# Patient Record
Sex: Female | Born: 1985 | Hispanic: Yes | Marital: Married | Smoking: Never smoker
Health system: Southern US, Community
[De-identification: ages and names within clinical notes are randomized; demographics above are authoritative.]

## PROBLEM LIST (undated history)

## (undated) ENCOUNTER — Inpatient Hospital Stay (HOSPITAL_COMMUNITY): Payer: Self-pay

## (undated) DIAGNOSIS — E119 Type 2 diabetes mellitus without complications: Secondary | ICD-10-CM

## (undated) DIAGNOSIS — K8020 Calculus of gallbladder without cholecystitis without obstruction: Secondary | ICD-10-CM

## (undated) DIAGNOSIS — O24419 Gestational diabetes mellitus in pregnancy, unspecified control: Secondary | ICD-10-CM

## (undated) HISTORY — DX: Calculus of gallbladder without cholecystitis without obstruction: K80.20

## (undated) HISTORY — PX: NO PAST SURGERIES: SHX2092

---

## 2013-09-20 LAB — PREGNANCY, URINE

## 2013-09-22 ENCOUNTER — Inpatient Hospital Stay (HOSPITAL_COMMUNITY)
Admission: AD | Admit: 2013-09-22 | Discharge: 2013-09-22 | Disposition: A | Discharge status: Home / Self Care | Payer: Self-pay | Source: Ambulatory Visit | Attending: Family Medicine | Admitting: Family Medicine

## 2013-09-22 ENCOUNTER — Inpatient Hospital Stay (HOSPITAL_COMMUNITY): Payer: Self-pay

## 2013-09-22 ENCOUNTER — Encounter (HOSPITAL_COMMUNITY): Payer: Self-pay | Admitting: *Deleted

## 2013-09-22 DIAGNOSIS — O2390 Unspecified genitourinary tract infection in pregnancy, unspecified trimester: Secondary | ICD-10-CM | POA: Insufficient documentation

## 2013-09-22 DIAGNOSIS — N898 Other specified noninflammatory disorders of vagina: Secondary | ICD-10-CM | POA: Insufficient documentation

## 2013-09-22 DIAGNOSIS — R011 Cardiac murmur, unspecified: Secondary | ICD-10-CM | POA: Insufficient documentation

## 2013-09-22 DIAGNOSIS — O26899 Other specified pregnancy related conditions, unspecified trimester: Secondary | ICD-10-CM

## 2013-09-22 DIAGNOSIS — N390 Urinary tract infection, site not specified: Secondary | ICD-10-CM | POA: Insufficient documentation

## 2013-09-22 DIAGNOSIS — R109 Unspecified abdominal pain: Secondary | ICD-10-CM | POA: Insufficient documentation

## 2013-09-22 DIAGNOSIS — O2340 Unspecified infection of urinary tract in pregnancy, unspecified trimester: Secondary | ICD-10-CM

## 2013-09-22 LAB — CBC WITH DIFFERENTIAL/PLATELET
Basophils Absolute: 0 10*3/uL (ref 0.0–0.1)
Basophils Relative: 0 % (ref 0–1)
Eosinophils Absolute: 0.3 10*3/uL (ref 0.0–0.7)
Eosinophils Relative: 3 % (ref 0–5)
HCT: 36.4 % (ref 36.0–46.0)
Hemoglobin: 13.1 g/dL (ref 12.0–15.0)
Lymphocytes Relative: 30 % (ref 12–46)
Lymphs Abs: 2.7 10*3/uL (ref 0.7–4.0)
MCH: 33.3 pg (ref 26.0–34.0)
MCHC: 36 g/dL (ref 30.0–36.0)
MCV: 92.6 fL (ref 78.0–100.0)
Monocytes Absolute: 0.6 10*3/uL (ref 0.1–1.0)
Monocytes Relative: 7 % (ref 3–12)
Neutro Abs: 5.4 10*3/uL (ref 1.7–7.7)
Neutrophils Relative %: 60 % (ref 43–77)
Platelets: 191 10*3/uL (ref 150–400)
RBC: 3.93 MIL/uL (ref 3.87–5.11)
RDW: 12.4 % (ref 11.5–15.5)
WBC: 9.1 10*3/uL (ref 4.0–10.5)

## 2013-09-22 LAB — URINALYSIS, ROUTINE W REFLEX MICROSCOPIC
Bilirubin Urine: NEGATIVE
Glucose, UA: NEGATIVE mg/dL
Ketones, ur: 40 mg/dL — AB
Nitrite: NEGATIVE
Protein, ur: NEGATIVE mg/dL
Specific Gravity, Urine: >1.03 — ABNORMAL HIGH (ref 1.005–1.030)
Urobilinogen, UA: 0.2 mg/dL (ref 0.0–1.0)
pH: 6 (ref 5.0–8.0)

## 2013-09-22 LAB — URINE MICROSCOPIC-ADD ON

## 2013-09-22 LAB — WET PREP, GENITAL
Trich, Wet Prep: NONE SEEN
Yeast Wet Prep HPF POC: NONE SEEN

## 2013-09-22 LAB — HCG, QUANTITATIVE, PREGNANCY: hCG, Beta Chain, Quant, S: 15467 m[IU]/mL — ABNORMAL HIGH (ref <5)

## 2013-09-22 LAB — POCT PREGNANCY, URINE: Preg Test, Ur: POSITIVE — AB

## 2013-09-22 MED ORDER — CEPHALEXIN 500 MG PO CAPS: 500.0000 mg | ORAL_CAPSULE | Freq: Three times a day (TID) | ORAL | Status: DC

## 2013-09-22 NOTE — MAU Provider Note (Signed)
CSN: [id number]     Arrival date & time 09/22/13  1652 History   None    Chief Complaint  Patient presents with  . Abdominal Cramping     (Consider location/radiation/quality/duration/timing/severity/associated sxs/prior Treatment) Patient is a 28 y.o. female presenting with cramps. The history is provided by the patient.  Abdominal Cramping The primary symptoms of the illness include abdominal pain, fatigue and vaginal discharge. The primary symptoms of the illness do not include fever, shortness of breath, nausea, vomiting, diarrhea or dysuria. The current episode started more than 2 days ago. The onset of the illness was gradual. The problem has been gradually worsening.  The vaginal discharge was first noticed today. Vaginal discharge is a new problem. The patient believes that the vaginal discharge is unchanged since it began. The amount of discharge is scant. The color of the discharge is brown. The vaginal discharge smells foul. The vaginal discharge is not associated with itching, burning, dysuria or dyspareunia.   The patient states that she believes she is currently pregnant. The patient has not had a change in bowel habit. Additional symptoms associated with the illness include heartburn, frequency and back pain. Symptoms associated with the illness do not include chills, constipation, urgency or hematuria.   Zenaida Haggar is a 28 y.o. G1P0, LMP 08/10/2013 and was normal. She complains of abdominal pain x 2 days that she describes as sharp, cramping that comes and goes. The vaginal discharge is dark color with odor. She has taken no medication for pain. Nothing makes the pain or bleeding better or worse.  Past Medical History  Diagnosis Date  . Heart murmur    Past Surgical History  Procedure Laterality Date  . No past surgeries     No family history on file. History  Substance Use Topics  . Smoking status: Never Smoker   . Smokeless tobacco: Not on file  . Alcohol Use: No    OB History   Grav Para Term Preterm Abortions TAB SAB Ect Mult Living   1              Review of Systems  Constitutional: Positive for fatigue. Negative for fever and chills.  HENT: Negative.   Eyes: Negative for pain and visual disturbance.  Respiratory: Negative for cough and shortness of breath.   Cardiovascular: Negative for chest pain.  Gastrointestinal: Positive for heartburn and abdominal pain. Negative for nausea, vomiting, diarrhea and constipation.  Genitourinary: Positive for frequency and vaginal discharge. Negative for dysuria, urgency, hematuria and dyspareunia.  Musculoskeletal: Positive for back pain.  Skin: Negative for itching and rash.  Neurological: Negative for dizziness, light-headedness and headaches.  Psychiatric/Behavioral: Negative for confusion. The patient is not nervous/anxious.       Allergies  Review of patient's allergies indicates no known allergies.  Home Medications  No current outpatient prescriptions on file. BP 106/55  Pulse 77  Temp(Src) 98.8 F (37.1 C) (Oral)  Resp 20  Ht 5' 1.5" (1.562 m)  Wt 231 lb (104.781 kg)  BMI 42.95 kg/m2  LMP 08/10/2013 Physical Exam  Nursing note and vitals reviewed. Constitutional: She is oriented to person, place, and time. She appears well-developed and well-nourished. No distress.  HENT:  Head: Normocephalic.  Eyes: Conjunctivae and EOM are normal.  Neck: Neck supple.  Cardiovascular: Normal rate.   Pulmonary/Chest: Effort normal.  Abdominal: Soft. There is no tenderness.  Genitourinary:  External genitalia without lesions. Small amount of dark blood vaginal vault. Cervix closed, long, no CMT, no adnexal  tenderness. Uterus slightly enlarged.   Musculoskeletal: Normal range of motion.  Neurological: She is alert and oriented to person, place, and time. No cranial nerve deficit.  Skin: Skin is warm and dry.  Psychiatric: She has a normal mood and affect. Her behavior is normal.   Results for  orders placed during the hospital encounter of 09/22/13 (from the past 24 hour(s))  URINALYSIS, ROUTINE W REFLEX MICROSCOPIC     Status: Abnormal   Collection Time    09/22/13  5:10 PM      Result Value Ref Range   Color, Urine YELLOW  YELLOW   APPearance CLEAR  CLEAR   Specific Gravity, Urine >1.030 (*) 1.005 - 1.030   pH 6.0  5.0 - 8.0   Glucose, UA NEGATIVE  NEGATIVE mg/dL   Hgb urine dipstick LARGE (*) NEGATIVE   Bilirubin Urine NEGATIVE  NEGATIVE   Ketones, ur 40 (*) NEGATIVE mg/dL   Protein, ur NEGATIVE  NEGATIVE mg/dL   Urobilinogen, UA 0.2  0.0 - 1.0 mg/dL   Nitrite NEGATIVE  NEGATIVE   Leukocytes, UA TRACE (*) NEGATIVE  URINE MICROSCOPIC-ADD ON     Status: Abnormal   Collection Time    09/22/13  5:10 PM      Result Value Ref Range   Squamous Epithelial / LPF FEW (*) RARE   WBC, UA 21-50  <3 WBC/hpf   RBC / HPF 7-10  <3 RBC/hpf   Bacteria, UA MANY (*) RARE   Urine-Other MUCOUS PRESENT    POCT PREGNANCY, URINE     Status: Abnormal   Collection Time    09/22/13  5:49 PM      Result Value Ref Range   Preg Test, Ur POSITIVE (*) NEGATIVE  CBC WITH DIFFERENTIAL     Status: None   Collection Time    09/22/13  5:50 PM      Result Value Ref Range   WBC 9.1  4.0 - 10.5 K/uL   RBC 3.93  3.87 - 5.11 MIL/uL   Hemoglobin 13.1  12.0 - 15.0 g/dL   HCT 16.1  09.6 - 04.5 %   MCV 92.6  78.0 - 100.0 fL   MCH 33.3  26.0 - 34.0 pg   MCHC 36.0  30.0 - 36.0 g/dL   RDW 40.9  81.1 - 91.4 %   Platelets 191  150 - 400 K/uL   Neutrophils Relative % 60  43 - 77 %   Neutro Abs 5.4  1.7 - 7.7 K/uL   Lymphocytes Relative 30  12 - 46 %   Lymphs Abs 2.7  0.7 - 4.0 K/uL   Monocytes Relative 7  3 - 12 %   Monocytes Absolute 0.6  0.1 - 1.0 K/uL   Eosinophils Relative 3  0 - 5 %   Eosinophils Absolute 0.3  0.0 - 0.7 K/uL   Basophils Relative 0  0 - 1 %   Basophils Absolute 0.0  0.0 - 0.1 K/uL  HCG, QUANTITATIVE, PREGNANCY     Status: Abnormal   Collection Time    09/22/13  5:50 PM       Result Value Ref Range   hCG, Beta Chain, Quant, S 15467 (*) <5 mIU/mL  WET PREP, GENITAL     Status: Abnormal   Collection Time    09/22/13  7:15 PM      Result Value Ref Range   Yeast Wet Prep HPF POC NONE SEEN  NONE SEEN   Trich, Wet Prep NONE  SEEN  NONE SEEN   Clue Cells Wet Prep HPF POC FEW (*) NONE SEEN   WBC, Wet Prep HPF POC MODERATE (*) NONE SEEN    [place] Ob Comp Less 14 Wks  09/22/2013   CLINICAL DATA:  Pelvic pain.  EXAM: OBSTETRIC <14 WK US AND TRANSVAGINAL OB US  TECHNIQUE: Both transabdominal and transvaginal ultrasound examinations were performed for complete evaluation of the gestation as well as the maternal uterus, adnexal regions, and pelvic cul-de-sac. Transvaginal technique was performed to assess early pregnancy.  COMPARISON:  None.  FINDINGS: Intrauterine gestational sac: Visualized/normal in shape.  Yolk sac:  Present  Embryo:  Present  Cardiac Activity: Present  Heart Rate:  92 bpm  CRL:   2.7  mm   5 w 6 d                  US EDC:  Maternal uterus/adnexae:  No subchorionic hemorrhage.  Normal right ovary.  Normal left ovary.  No free pelvic fluid.  IMPRESSION: Single living intrauterine embryo estimated at 5 weeks and 6 days gestation.  No subchorionic hemorrhage.  Normal ovaries.   Electronically Signed   By: Loralie ChampagneMark  Gallerani M.D.   On: 09/22/2013 18:37   [place] Ob Transvaginal  09/22/2013   CLINICAL DATA:  Pelvic pain.  EXAM: OBSTETRIC <14 WK US AND TRANSVAGINAL OB US  TECHNIQUE: Both transabdominal and transvaginal ultrasound examinations were performed for complete evaluation of the gestation as well as the maternal uterus, adnexal regions, and pelvic cul-de-sac. Transvaginal technique was performed to assess early pregnancy.  COMPARISON:  None.  FINDINGS: Intrauterine gestational sac: Visualized/normal in shape.  Yolk sac:  Present  Embryo:  Present  Cardiac Activity: Present  Heart Rate:  92 bpm  CRL:   2.7  mm   5 w 6 d                  US EDC:  Maternal uterus/adnexae:  No  subchorionic hemorrhage.  Normal right ovary.  Normal left ovary.  No free pelvic fluid.  IMPRESSION: Single living intrauterine embryo estimated at 5 weeks and 6 days gestation.  No subchorionic hemorrhage.  Normal ovaries.   Electronically Signed   By: Loralie ChampagneMark  Gallerani M.D.   On: 09/22/2013 18:37    ED Course  Procedures   MDM  28 y.o. female with abdominal pain and vaginal discharge in early pregnancy. Will treat for UTI and she will start prenatal care. I have reviewed this patient's vital signs, nurses notes, appropriate labs and imaging.  I have discussed findings with the patient and plan of care. She voices understanding.    Medication List    TAKE these medications       cephALEXin 500 MG capsule  Commonly known as:  KEFLEX  Take 1 capsule (500 mg total) by mouth 3 (three) times daily.      ASK your doctor about these medications       prenatal multivitamin Tabs tablet  Take 1 tablet by mouth daily at 12 noon.

## 2013-09-22 NOTE — MAU Note (Signed)
Has been having cramping.  Found out preg 04/05.  Had a really strong cramp at work today and had noticed a reddish d/c today.

## 2013-09-23 LAB — GC/CHLAMYDIA PROBE AMP
CT Probe RNA: NEGATIVE
GC Probe RNA: NEGATIVE

## 2013-09-23 LAB — CULTURE, OB URINE: Colony Count: >=100000

## 2013-09-26 NOTE — MAU Provider Note (Signed)
Attestation of Attending Supervision of Advanced Practitioner (PA/CNM/NP): Evaluation and management procedures were performed by the Advanced Practitioner under my supervision and collaboration.  I have reviewed the Advanced Practitioner's note and chart, and I agree with the management and plan.  Reva Boresanya S Thomasene Dubow, MD [facility] Attending 09/26/2013 8:08 AM

## 2013-11-02 ENCOUNTER — Encounter: Payer: Self-pay | Admitting: Advanced Practice Midwife

## 2013-11-10 ENCOUNTER — Encounter: Payer: Self-pay | Admitting: *Deleted

## 2013-11-11 ENCOUNTER — Encounter: Payer: Self-pay | Admitting: General Practice

## 2013-12-12 ENCOUNTER — Encounter: Payer: Self-pay | Admitting: General Practice

## 2013-12-26 ENCOUNTER — Encounter: Payer: Self-pay | Attending: Family Medicine | Admitting: *Deleted

## 2013-12-26 ENCOUNTER — Ambulatory Visit (INDEPENDENT_AMBULATORY_CARE_PROVIDER_SITE_OTHER): Payer: Self-pay | Admitting: Family Medicine

## 2013-12-26 ENCOUNTER — Encounter: Payer: Self-pay | Admitting: Family Medicine

## 2013-12-26 VITALS — BP 97/62 | HR 74 | Wt 220.1 lb

## 2013-12-26 DIAGNOSIS — E119 Type 2 diabetes mellitus without complications: Secondary | ICD-10-CM

## 2013-12-26 DIAGNOSIS — Z713 Dietary counseling and surveillance: Secondary | ICD-10-CM | POA: Insufficient documentation

## 2013-12-26 DIAGNOSIS — O24919 Unspecified diabetes mellitus in pregnancy, unspecified trimester: Secondary | ICD-10-CM

## 2013-12-26 DIAGNOSIS — O24912 Unspecified diabetes mellitus in pregnancy, second trimester: Secondary | ICD-10-CM | POA: Insufficient documentation

## 2013-12-26 DIAGNOSIS — Z3400 Encounter for supervision of normal first pregnancy, unspecified trimester: Secondary | ICD-10-CM

## 2013-12-26 LAB — POCT URINALYSIS DIP (DEVICE)
Bilirubin Urine: NEGATIVE
Glucose, UA: NEGATIVE mg/dL
Hgb urine dipstick: NEGATIVE
Ketones, ur: NEGATIVE mg/dL
Nitrite: NEGATIVE
Protein, ur: NEGATIVE mg/dL
Specific Gravity, Urine: 1.025 (ref 1.005–1.030)
Urobilinogen, UA: 0.2 mg/dL (ref 0.0–1.0)
pH: 6 (ref 5.0–8.0)

## 2013-12-26 MED ORDER — ASPIRIN EC 81 MG PO TBEC: 81.0000 mg | DELAYED_RELEASE_TABLET | Freq: Every day | ORAL | Status: DC

## 2013-12-26 NOTE — Progress Notes (Signed)
Pt seen 12/26/13  for GDM diet education.  Obese woman pregravid with 22.9 pound loss at 5742w5d.  Attributes wt loss to healthier eating, only wanted to eat fruits and vegetables during early pregnancy.  Pt reports eating 5 x/day.  NKFA.  No N/V.  She is taking PNV.  No meds.  Discussed wt gain goal of 11-20# and incorporating physical activity into daily routine.  Pt given written and verbal GDM education.  She agrees to follow GDM diet including 3 meals + 3 snacks/day and proper carbohydrate/protein combination.  Follow-up in 4-6 wks.    Melanee LeftErin Cashwell, MPH RD LDN

## 2013-12-26 NOTE — Progress Notes (Signed)
  Patient was seen on 12/26/13 for Gestational Diabetes self-management . The following learning objectives were met by the patient :   States the definition of Gestational Diabetes  States when to check blood glucose levels  Demonstrates proper blood glucose monitoring techniques  States the effect of stress and exercise on blood glucose levels  States the importance of limiting caffeine and abstaining from alcohol and smoking  Plan:  Consider  increasing your activity level by walking daily as tolerated Begin checking BG before breakfast and 2 hours after first bit of breakfast, lunch and dinner after  as directed by MD  Take medication  as directed by MD  Blood glucose monitor given: [employer] Lot # [id number] Exp: 10/28/15  Patient instructed to monitor glucose levels: FBS: 60 - <90    2 hour: <120  Patient received the following handouts:  Nutrition Diabetes and Pregnancy  Carbohydrate Counting List  Meal Planning worksheet  Patient will be seen for follow-up as needed.

## 2013-12-26 NOTE — Progress Notes (Signed)
Patient states that [facility] She has had regular prenatal care. They have done her labs and pap smear. She reports that her sugar test was high. But doesn't know the value. Has not had anatomy scan yet. [facility]eds [facility].

## 2013-12-26 NOTE — Patient Instructions (Signed)
Second Trimester of Pregnancy The second trimester is from week 13 through week 28, months 4 through 6. The second trimester is often a time when you feel your best. Your body has also adjusted to being pregnant, and you begin to feel better physically. Usually, morning sickness has lessened or quit completely, you may have more energy, and you may have an increase in appetite. The second trimester is also a time when the fetus is growing rapidly. At the end of the sixth month, the fetus is about 9 inches long and weighs about 1 pounds. You will likely begin to feel the baby move (quickening) between 18 and 20 weeks of the pregnancy. BODY CHANGES Your body goes through many changes during pregnancy. The changes vary from woman to woman.   Your weight will continue to increase. You will notice your lower abdomen bulging out.  You may begin to get stretch marks on your hips, abdomen, and breasts.  You may develop headaches that can be relieved by medicines approved by your health care provider.  You may urinate more often because the fetus is pressing on your bladder.  You may develop or continue to have heartburn as a result of your pregnancy.  You may develop constipation because certain hormones are causing the muscles that push waste through your intestines to slow down.  You may develop hemorrhoids or swollen, bulging veins (varicose veins).  You may have back pain because of the weight gain and pregnancy hormones relaxing your joints between the bones in your pelvis and as a result of a shift in weight and the muscles that support your balance.  Your breasts will continue to grow and be tender.  Your gums may bleed and may be sensitive to brushing and flossing.  Dark spots or blotches (chloasma, mask of pregnancy) may develop on your face. This will likely fade after the baby is born.  A dark line from your belly button to the pubic area (linea nigra) may appear. This will likely fade  after the baby is born.  You may have changes in your hair. These can include thickening of your hair, rapid growth, and changes in texture. Some women also have hair loss during or after pregnancy, or hair that feels dry or thin. Your hair will most likely return to normal after your baby is born. WHAT TO EXPECT AT YOUR PRENATAL VISITS During a routine prenatal visit:  You will be weighed to make sure you and the fetus are growing normally.  Your blood pressure will be taken.  Your abdomen will be measured to track your baby's growth.  The fetal heartbeat will be listened to.  Any test results from the previous visit will be discussed. Your health care provider may ask you:  How you are feeling.  If you are feeling the baby move.  If you have had any abnormal symptoms, such as leaking fluid, bleeding, severe headaches, or abdominal cramping.  If you have any questions. Other tests that may be performed during your second trimester include:  Blood tests that check for:  Low iron levels (anemia).  Gestational diabetes (between 24 and 28 weeks).  Rh antibodies.  Urine tests to check for infections, diabetes, or protein in the urine.  An ultrasound to confirm the proper growth and development of the baby.  An amniocentesis to check for possible genetic problems.  Fetal screens for spina bifida and Down syndrome. HOME CARE INSTRUCTIONS   Avoid all smoking, herbs, alcohol, and unprescribed   drugs. These chemicals affect the formation and growth of the baby.  Follow your health care provider's instructions regarding medicine use. There are medicines that are either safe or unsafe to take during pregnancy.  Exercise only as directed by your health care provider. Experiencing uterine cramps is a good sign to stop exercising.  Continue to eat regular, healthy meals.  Wear a good support bra for breast tenderness.  Do not use hot tubs, steam rooms, or saunas.  Wear your  seat belt at all times when driving.  Avoid raw meat, uncooked cheese, cat litter boxes, and soil used by cats. These carry germs that can cause birth defects in the baby.  Take your prenatal vitamins.  Try taking a stool softener (if your health care provider approves) if you develop constipation. Eat more high-fiber foods, such as fresh vegetables or fruit and whole grains. Drink plenty of fluids to keep your urine clear or pale yellow.  Take warm sitz baths to soothe any pain or discomfort caused by hemorrhoids. Use hemorrhoid cream if your health care provider approves.  If you develop varicose veins, wear support hose. Elevate your feet for 15 minutes, 3-4 times a day. Limit salt in your diet.  Avoid heavy lifting, wear low heel shoes, and practice good posture.  Rest with your legs elevated if you have leg cramps or low back pain.  Visit your dentist if you have not gone yet during your pregnancy. Use a soft toothbrush to brush your teeth and be gentle when you floss.  A sexual relationship may be continued unless your health care provider directs you otherwise.  Continue to go to all your prenatal visits as directed by your health care provider. SEEK MEDICAL CARE IF:   You have dizziness.  You have mild pelvic cramps, pelvic pressure, or nagging pain in the abdominal area.  You have persistent nausea, vomiting, or diarrhea.  You have a bad smelling vaginal discharge.  You have pain with urination. SEEK IMMEDIATE MEDICAL CARE IF:   You have a fever.  You are leaking fluid from your vagina.  You have spotting or bleeding from your vagina.  You have severe abdominal cramping or pain.  You have rapid weight gain or loss.  You have shortness of breath with chest pain.  You notice sudden or extreme swelling of your face, hands, ankles, feet, or legs.  You have not felt your baby move in over an hour.  You have severe headaches that do not go away with  medicine.  You have vision changes. Document Released: 05/27/2001 Document Revised: 06/07/2013 Document Reviewed: 08/03/2012 Exit[employer] Patient Information 2015 [employer]. This information is not intended to replace advice given to you by your health care provider. Make sure you discuss any questions you have with your health care provider.  

## 2013-12-26 NOTE — Progress Notes (Signed)
  Subjective:    April [name] is being seen today for her first obstetrical visit.  [facility]  This is a planned pregnancy. States she was sent here for an elevated one hour glucola. She is at 1115w5d gestation. Her obstetrical history is significant for gestational DM. Relationship with FOB: spouse, living together. Patient does intend to breast feed. Pregnancy history fully reviewed.  Patient reports no complaints. Trying to watch diet. No fevers, chills, nausea, vomiting, diarrhea.    Review of Systems:   Review of Systems See above.  Objective:     BP 97/62  Pulse 74  Wt 220 lb 1.6 oz (99.837 kg)  LMP 08/10/2013 Physical Exam  Exam GENERAL: Well-developed, well-nourished female in no acute distress.  HEENT: Normocephalic, atraumatic. Sclerae anicteric.  NECK: Supple. Normal thyroid.  LUNGS: Clear to auscultation bilaterally.  HEART: Regular rate and rhythm. BABDOMEN: Soft, nontender, nondistended. gravid PELVIC: deferred  EXTREMITIES: No cyanosis, clubbing, or edema, 2+ distal pulses.    Assessment:    Pregnancy: G1P0 Patient Active Problem List   Diagnosis Date Noted  . Diabetes mellitus affecting pregnancy in second trimester, antepartum 12/26/2013       Plan:     Records requested.  Prenatal vitamins. Problem list reviewed and updated. Role of ultrasound in pregnancy discussed; fetal survey: ordered. Urine cx and UDS done today Diabetes education/ nutrition Start ASA  Follow up in 2 weeks. 50% of 30 min visit spent on counseling and coordination of care.   Latham Kinzler L 12/26/2013

## 2013-12-27 LAB — PRESCRIPTION MONITORING PROFILE (19 PANEL)
Amphetamine/Meth: NEGATIVE ng/mL
Barbiturate Screen, Urine: NEGATIVE ng/mL
Benzodiazepine Screen, Urine: NEGATIVE ng/mL
Buprenorphine, Urine: NEGATIVE ng/mL
Cannabinoid Scrn, Ur: NEGATIVE ng/mL
Carisoprodol, Urine: NEGATIVE ng/mL
Cocaine Metabolites: NEGATIVE ng/mL
Creatinine, Urine: 205.24 mg/dL (ref >20.0)
Fentanyl, Ur: NEGATIVE ng/mL
MDMA URINE: NEGATIVE ng/mL
Meperidine, Ur: NEGATIVE ng/mL
Methadone Screen, Urine: NEGATIVE ng/mL
Methaqualone: NEGATIVE ng/mL
Nitrites, Initial: NEGATIVE ug/mL
Opiate Screen, Urine: NEGATIVE ng/mL
Oxycodone Screen, Ur: NEGATIVE ng/mL
Phencyclidine, Ur: NEGATIVE ng/mL
Propoxyphene: NEGATIVE ng/mL
Tapentadol, urine: NEGATIVE ng/mL
Tramadol Scrn, Ur: NEGATIVE ng/mL
Zolpidem, Urine: NEGATIVE ng/mL
pH, Initial: 6.3 pH (ref 4.5–8.9)

## 2013-12-28 LAB — CULTURE, OB URINE: Colony Count: 4000

## 2013-12-29 ENCOUNTER — Ambulatory Visit (HOSPITAL_COMMUNITY)
Admission: RE | Admit: 2013-12-29 | Discharge: 2013-12-29 | Disposition: A | Discharge status: Home / Self Care | Payer: Self-pay | Source: Ambulatory Visit | Attending: Family Medicine | Admitting: Family Medicine

## 2013-12-29 ENCOUNTER — Other Ambulatory Visit: Payer: Self-pay | Admitting: Family Medicine

## 2013-12-29 DIAGNOSIS — O24912 Unspecified diabetes mellitus in pregnancy, second trimester: Secondary | ICD-10-CM

## 2013-12-29 DIAGNOSIS — O24919 Unspecified diabetes mellitus in pregnancy, unspecified trimester: Secondary | ICD-10-CM | POA: Insufficient documentation

## 2013-12-29 DIAGNOSIS — Z3689 Encounter for other specified antenatal screening: Secondary | ICD-10-CM | POA: Insufficient documentation

## 2013-12-29 DIAGNOSIS — E119 Type 2 diabetes mellitus without complications: Secondary | ICD-10-CM | POA: Insufficient documentation

## 2013-12-30 ENCOUNTER — Encounter: Payer: Self-pay | Admitting: Family Medicine

## 2014-01-09 ENCOUNTER — Encounter: Payer: Self-pay | Admitting: *Deleted

## 2014-01-09 ENCOUNTER — Ambulatory Visit (INDEPENDENT_AMBULATORY_CARE_PROVIDER_SITE_OTHER): Payer: Self-pay | Admitting: Family Medicine

## 2014-01-09 VITALS — BP 110/52 | HR 70 | Temp 98.2°F | Wt 221.1 lb

## 2014-01-09 DIAGNOSIS — E119 Type 2 diabetes mellitus without complications: Secondary | ICD-10-CM

## 2014-01-09 DIAGNOSIS — O24919 Unspecified diabetes mellitus in pregnancy, unspecified trimester: Secondary | ICD-10-CM

## 2014-01-09 DIAGNOSIS — O24912 Unspecified diabetes mellitus in pregnancy, second trimester: Secondary | ICD-10-CM

## 2014-01-09 DIAGNOSIS — O0990 Supervision of high risk pregnancy, unspecified, unspecified trimester: Secondary | ICD-10-CM | POA: Insufficient documentation

## 2014-01-09 LAB — HEMOGLOBIN A1C
Hgb A1c MFr Bld: 5.1 % (ref <5.7)
Mean Plasma Glucose: 100 mg/dL (ref <117)

## 2014-01-09 NOTE — Progress Notes (Signed)
Lab tech could not find a urine left by the patient/labs to be cancelled

## 2014-01-09 NOTE — Progress Notes (Signed)
ROI signed [facility]-- to be faxed by front office.   Optho appointment scheduled with [facility] for 01/12/14 at 0930. Fetal echo scheduled for 02/06/14 at 1015

## 2014-01-09 NOTE — Patient Instructions (Addendum)
Following an appropriate diet and keeping your blood sugar under control is the most important thing to do for your health and that of your unborn baby.  Please check your blood sugar 4 times daily.  Please keep accurate BS logs and bring them with you to every visit.  Please bring your meter also.  Goals for Blood sugar should be: 1. Fasting (first thing in the morning before eating) should be less than 90.   2.  2 hours after meals should be less than 120.  Please eat 3 meals and 3 snacks.  Include protein (meat, dairy-cheese, eggs, nuts) with all meals.  Be mindful that carbohydrates increase your blood sugar.  Not just sweet food (cookies, cake, donuts, fruit, juice, soda) but also bread, pasta, rice, and potatoes.  You have to limit how many carbs you are eating.  Adding exercise, as little as 30 minutes a day can decrease your blood sugar.  Gestational Diabetes Mellitus Gestational diabetes mellitus, often simply referred to as gestational diabetes, is a type of diabetes that some women develop during pregnancy. In gestational diabetes, the pancreas does not make enough insulin (a hormone), the cells are less responsive to the insulin that is made (insulin resistance), or both.Normally, insulin moves sugars from food into the tissue cells. The tissue cells use the sugars for energy. The lack of insulin or the lack of normal response to insulin causes excess sugars to build up in the blood instead of going into the tissue cells. As a result, high blood sugar (hyperglycemia) develops. The effect of high sugar (glucose) levels can cause many problems.  RISK FACTORS You have an increased chance of developing gestational diabetes if you have a family history of diabetes and also have one or more of the following risk factors:  A body mass index over 30 (obesity).  A previous pregnancy with gestational diabetes.  An older age at the time of pregnancy. If blood glucose levels are kept in  the normal range during pregnancy, women can have a healthy pregnancy. If your blood glucose levels are not well controlled, there may be risks to you, your unborn baby (fetus), your labor and delivery, or your newborn baby.  SYMPTOMS  If symptoms are experienced, they are much like symptoms you would normally expect during pregnancy. The symptoms of gestational diabetes include:   Increased thirst (polydipsia).  Increased urination (polyuria).  Increased urination during the night (nocturia).  Weight loss. This weight loss may be rapid.  Frequent, recurring infections.  Tiredness (fatigue).  Weakness.  Vision changes, such as blurred vision.  Fruity smell to your breath.  Abdominal pain. DIAGNOSIS Diabetes is diagnosed when blood glucose levels are increased. Your blood glucose level may be checked by one or more of the following blood tests:  A fasting blood glucose test. You will not be allowed to eat for at least 8 hours before a blood sample is taken.  A random blood glucose test. Your blood glucose is checked at any time of the day regardless of when you ate.  A hemoglobin A1c blood glucose test. A hemoglobin A1c test provides information about blood glucose control over the previous 3 months.  An oral glucose tolerance test (OGTT). Your blood glucose is measured after you have not eaten (fasted) for 1-3 hours and then after you drink a glucose-containing beverage. Since the hormones that cause insulin resistance are highest at about 24-28 weeks of a pregnancy, an OGTT is usually performed during that time. If   you have risk factors for gestational diabetes, your health care provider may test you for gestational diabetes earlier than 24 weeks of pregnancy. TREATMENT   You will need to take diabetes medicine or insulin daily to keep blood glucose levels in the desired range.  You will need to match insulin dosing with exercise and healthy food choices. The treatment goal is  to maintain the before-meal (preprandial), bedtime, and overnight blood glucose level at 60-99 mg/dL during pregnancy. The treatment goal is to further maintain peak after-meal blood sugar (postprandial glucose) level at 100-140 mg/dL. HOME CARE INSTRUCTIONS   Have your hemoglobin A1c level checked twice a year.  Perform daily blood glucose monitoring as directed by your health care provider. It is common to perform frequent blood glucose monitoring.  Monitor urine ketones when you are ill and as directed by your health care provider.  Take your diabetes medicine and insulin as directed by your health care provider to maintain your blood glucose level in the desired range.  Never run out of diabetes medicine or insulin. It is needed every day.  Adjust insulin based on your intake of carbohydrates. Carbohydrates can raise blood glucose levels but need to be included in your diet. Carbohydrates provide vitamins, minerals, and fiber which are an essential part of a healthy diet. Carbohydrates are found in fruits, vegetables, whole grains, dairy products, legumes, and foods containing added sugars.  Eat healthy foods. Alternate 3 meals with 3 snacks.  Maintain a healthy weight gain. The usual total expected weight gain varies according to your prepregnancy body mass index (BMI).  Carry a medical alert card or wear your medical alert jewelry.  Carry a 15-gram carbohydrate snack with you at all times to treat low blood glucose (hypoglycemia). Some examples of 15-gram carbohydrate snacks include:  Glucose tablets, 3 or 4.  Glucose gel, 15-gram tube.  Raisins, 2 tablespoons (24 g).  Jelly beans, 6.  Animal crackers, 8.  Fruit juice, regular soda, or low-fat milk, 4 ounces (120 mL).  Gummy treats, 9.  Recognize hypoglycemia. Hypoglycemia during pregnancy occurs with blood glucose levels of 60 mg/dL and below. The risk for hypoglycemia increases when fasting or skipping meals, during or  after intense exercise, and during sleep. Hypoglycemia symptoms can include:  Tremors or shakes.  Decreased ability to concentrate.  Sweating.  Increased heart rate.  Headache.  Dry mouth.  Hunger.  Irritability.  Anxiety.  Restless sleep.  Altered speech or coordination.  Confusion.  Treat hypoglycemia promptly. If you are alert and able to safely swallow, follow the 15:15 rule:  Take 15-20 grams of rapid-acting glucose or carbohydrate. Rapid-acting options include glucose gel, glucose tablets, or 4 ounces (120 mL) of fruit juice, regular soda, or low-fat milk.  Check your blood glucose level 15 minutes after taking the glucose.  Take 15-20 grams more of glucose if the repeat blood glucose level is still 70 mg/dL or below.  Eat a meal or snack within 1 hour once blood glucose levels return to normal.  Be alert to polyuria (excess urination) and polydipsia (excess thirst) which are early signs of hyperglycemia. An early awareness of hyperglycemia allows for prompt treatment. Treat hyperglycemia as directed by your health care provider.  Engage in at least 30 minutes of physical activity a day or as directed by your health care provider. Ten minutes of physical activity timed 30 minutes after each meal is encouraged to control postprandial blood glucose levels.  Adjust your insulin dosing and food intake as   needed if you start a new exercise or sport.  Follow your sick-day plan at any time you are unable to eat or drink as usual.  Avoid tobacco and alcohol use.  Keep all follow-up visits as directed by your health care provider.  Follow the advice of your health care provider regarding your prenatal and post-delivery (postpartum) appointments, meal planning, exercise, medicines, vitamins, blood tests, other medical tests, and physical activities.  Perform daily skin and foot care. Examine your skin and feet daily for cuts, bruises, redness, nail problems, bleeding,  blisters, or sores.  Brush your teeth and gums at least twice a day and floss at least once a day. Follow up with your dentist regularly.  Schedule an eye exam during the first trimester of your pregnancy or as directed by your health care provider.  Share your diabetes management plan with your workplace or school.  Stay up-to-date with immunizations.  Learn to manage stress.  Obtain ongoing diabetes education and support as needed.  Learn about and consider breastfeeding your baby.  You should have your blood sugar level checked 6-12 weeks after delivery. This is done with an oral glucose tolerance test (OGTT). SEEK MEDICAL CARE IF:   You are unable to eat food or drink fluids for more than 6 hours.  You have nausea and vomiting for more than 6 hours.  You have a blood glucose level of 200 mg/dL and you have ketones in your urine.  There is a change in mental status.  You develop vision problems.  You have a persistent headache.  You have upper abdominal pain or discomfort.  You develop an additional serious illness.  You have diarrhea for more than 6 hours.  You have been sick or have had a fever for a couple of days and are not getting better. SEEK IMMEDIATE MEDICAL CARE IF:   You have difficulty breathing.  You no longer feel the baby moving.  You are bleeding or have discharge from your vagina.  You start having premature contractions or labor. MAKE SURE YOU:  Understand these instructions.  Will watch your condition.  Will get help right away if you are not doing well or get worse. Document Released: 09/08/2000 Document Revised: 10/17/2013 Document Reviewed: 12/30/2011 [employer] Patient Information 2015 [employer]. This information is not intended to replace advice given to you by your health care provider. Make sure you discuss any questions you have with your health care provider.  Breastfeeding Deciding to breastfeed is one of the best choices  you can make for you and your baby. A change in hormones during pregnancy causes your breast tissue to grow and increases the number and size of your milk ducts. These hormones also allow proteins, sugars, and fats from your blood supply to make breast milk in your milk-producing glands. Hormones prevent breast milk from being released before your baby is born as well as prompt milk flow after birth. Once breastfeeding has begun, thoughts of your baby, as well as his or her sucking or crying, can stimulate the release of milk from your milk-producing glands.  BENEFITS OF BREASTFEEDING For Your Baby  Your first milk (colostrum) helps your baby's digestive system function better.   There are antibodies in your milk that help your baby fight off infections.   Your baby has a lower incidence of asthma, allergies, and sudden infant death syndrome.   The nutrients in breast milk are better for your baby than infant formulas and are designed uniquely for   your baby's needs.   Breast milk improves your baby's brain development.   Your baby is less likely to develop other conditions, such as childhood obesity, asthma, or type 2 diabetes mellitus.  For You   Breastfeeding helps to create a very special bond between you and your baby.   Breastfeeding is convenient. Breast milk is always available at the correct temperature and costs nothing.   Breastfeeding helps to burn calories and helps you lose the weight gained during pregnancy.   Breastfeeding makes your uterus contract to its prepregnancy size faster and slows bleeding (lochia) after you give birth.   Breastfeeding helps to lower your risk of developing type 2 diabetes mellitus, osteoporosis, and breast or ovarian cancer later in life. SIGNS THAT YOUR BABY IS HUNGRY Early Signs of Hunger  Increased alertness or activity.  Stretching.  Movement of the head from side to side.  Movement of the head and opening of the mouth when  the corner of the mouth or cheek is stroked (rooting).  Increased sucking sounds, smacking lips, cooing, sighing, or squeaking.  Hand-to-mouth movements.  Increased sucking of fingers or hands. Late Signs of Hunger  Fussing.  Intermittent crying. Extreme Signs of Hunger Signs of extreme hunger will require calming and consoling before your baby will be able to breastfeed successfully. Do not wait for the following signs of extreme hunger to occur before you initiate breastfeeding:   Restlessness.  A loud, strong cry.   Screaming. BREASTFEEDING BASICS Breastfeeding Initiation  Find a comfortable place to sit or lie down, with your neck and back well supported.  Place a pillow or rolled up blanket under your baby to bring him or her to the level of your breast (if you are seated). Nursing pillows are specially designed to help support your arms and your baby while you breastfeed.  Make sure that your baby's abdomen is facing your abdomen.   Gently massage your breast. With your fingertips, massage from your chest wall toward your nipple in a circular motion. This encourages milk flow. You may need to continue this action during the feeding if your milk flows slowly.  Support your breast with 4 fingers underneath and your thumb above your nipple. Make sure your fingers are well away from your nipple and your baby's mouth.   Stroke your baby's lips gently with your finger or nipple.   When your baby's mouth is open wide enough, quickly bring your baby to your breast, placing your entire nipple and as much of the colored area around your nipple (areola) as possible into your baby's mouth.   More areola should be visible above your baby's upper lip than below the lower lip.   Your baby's tongue should be between his or her lower gum and your breast.   Ensure that your baby's mouth is correctly positioned around your nipple (latched). Your baby's lips should create a seal on  your breast and be turned out (everted).  It is common for your baby to suck about 2-3 minutes in order to start the flow of breast milk. Latching Teaching your baby how to latch on to your breast properly is very important. An improper latch can cause nipple pain and decreased milk supply for you and poor weight gain in your baby. Also, if your baby is not latched onto your nipple properly, he or she may swallow some air during feeding. This can make your baby fussy. Burping your baby when you switch breasts during the feeding   can help to get rid of the air. However, teaching your baby to latch on properly is still the best way to prevent fussiness from swallowing air while breastfeeding. Signs that your baby has successfully latched on to your nipple:    Silent tugging or silent sucking, without causing you pain.   Swallowing heard between every 3-4 sucks.    Muscle movement above and in front of his or her ears while sucking.  Signs that your baby has not successfully latched on to nipple:   Sucking sounds or smacking sounds from your baby while breastfeeding.  Nipple pain. If you think your baby has not latched on correctly, slip your finger into the corner of your baby's mouth to break the suction and place it between your baby's gums. Attempt breastfeeding initiation again. Signs of Successful Breastfeeding Signs from your baby:   A gradual decrease in the number of sucks or complete cessation of sucking.   Falling asleep.   Relaxation of his or her body.   Retention of a small amount of milk in his or her mouth.   Letting go of your breast by himself or herself. Signs from you:  Breasts that have increased in firmness, weight, and size 1-3 hours after feeding.   Breasts that are softer immediately after breastfeeding.  Increased milk volume, as well as a change in milk consistency and color by the fifth day of breastfeeding.   Nipples that are not sore,  cracked, or bleeding. Signs That Your Baby is Getting Enough Milk  Wetting at least 3 diapers in a 24-hour period. The urine should be clear and pale yellow by age 5 days.  At least 3 stools in a 24-hour period by age 5 days. The stool should be soft and yellow.  At least 3 stools in a 24-hour period by age 7 days. The stool should be seedy and yellow.  No loss of weight greater than 10% of birth weight during the first 3 days of age.  Average weight gain of 4-7 ounces (113-198 g) per week after age 4 days.  Consistent daily weight gain by age 5 days, without weight loss after the age of 2 weeks. After a feeding, your baby may spit up a small amount. This is common. BREASTFEEDING FREQUENCY AND DURATION Frequent feeding will help you make more milk and can prevent sore nipples and breast engorgement. Breastfeed when you feel the need to reduce the fullness of your breasts or when your baby shows signs of hunger. This is called "breastfeeding on demand." Avoid introducing a pacifier to your baby while you are working to establish breastfeeding (the first 4-6 weeks after your baby is born). After this time you may choose to use a pacifier. Research has shown that pacifier use during the first year of a baby's life decreases the risk of sudden infant death syndrome (SIDS). Allow your baby to feed on each breast as long as he or she wants. Breastfeed until your baby is finished feeding. When your baby unlatches or falls asleep while feeding from the first breast, offer the second breast. Because newborns are often sleepy in the first few weeks of life, you may need to awaken your baby to get him or her to feed. Breastfeeding times will vary from baby to baby. However, the following rules can serve as a guide to help you ensure that your baby is properly fed:  Newborns (babies 4 weeks of age or younger) may breastfeed every 1-3 hours.    Newborns should not go longer than 3 hours during the day or 5  hours during the night without breastfeeding.  You should breastfeed your baby a minimum of 8 times in a 24-hour period until you begin to introduce solid foods to your baby at around 6 months of age. BREAST MILK PUMPING Pumping and storing breast milk allows you to ensure that your baby is exclusively fed your breast milk, even at times when you are unable to breastfeed. This is especially important if you are going back to work while you are still breastfeeding or when you are not able to be present during feedings. Your lactation consultant can give you guidelines on how long it is safe to store breast milk.  A breast pump is a machine that allows you to pump milk from your breast into a sterile bottle. The pumped breast milk can then be stored in a refrigerator or freezer. Some breast pumps are operated by hand, while others use electricity. Ask your lactation consultant which type will work best for you. Breast pumps can be purchased, but some hospitals and breastfeeding support groups lease breast pumps on a monthly basis. A lactation consultant can teach you how to hand express breast milk, if you prefer not to use a pump.  CARING FOR YOUR BREASTS WHILE YOU BREASTFEED Nipples can become dry, cracked, and sore while breastfeeding. The following recommendations can help keep your breasts moisturized and healthy:  Avoid using soap on your nipples.   Wear a supportive bra. Although not required, special nursing bras and tank tops are designed to allow access to your breasts for breastfeeding without taking off your entire bra or top. Avoid wearing underwire-style bras or extremely tight bras.  Air dry your nipples for 3-4minutes after each feeding.   Use only cotton bra pads to absorb leaked breast milk. Leaking of breast milk between feedings is normal.   Use lanolin on your nipples after breastfeeding. Lanolin helps to maintain your skin's normal moisture barrier. If you use pure lanolin,  you do not need to wash it off before feeding your baby again. Pure lanolin is not toxic to your baby. You may also hand express a few drops of breast milk and gently massage that milk into your nipples and allow the milk to air dry. In the first few weeks after giving birth, some women experience extremely full breasts (engorgement). Engorgement can make your breasts feel heavy, warm, and tender to the touch. Engorgement peaks within 3-5 days after you give birth. The following recommendations can help ease engorgement:  Completely empty your breasts while breastfeeding or pumping. You may want to start by applying warm, moist heat (in the shower or with warm water-soaked hand towels) just before feeding or pumping. This increases circulation and helps the milk flow. If your baby does not completely empty your breasts while breastfeeding, pump any extra milk after he or she is finished.  Wear a snug bra (nursing or regular) or tank top for 1-2 days to signal your body to slightly decrease milk production.  Apply ice packs to your breasts, unless this is too uncomfortable for you.  Make sure that your baby is latched on and positioned properly while breastfeeding. If engorgement persists after 48 hours of following these recommendations, contact your health care provider or a lactation consultant. OVERALL HEALTH CARE RECOMMENDATIONS WHILE BREASTFEEDING  Eat healthy foods. Alternate between meals and snacks, eating 3 of each per day. Because what you eat affects your breast   milk, some of the foods may make your baby more irritable than usual. Avoid eating these foods if you are sure that they are negatively affecting your baby.  Drink milk, fruit juice, and water to satisfy your thirst (about 10 glasses a day).   Rest often, relax, and continue to take your prenatal vitamins to prevent fatigue, stress, and anemia.  Continue breast self-awareness checks.  Avoid chewing and smoking  tobacco.  Avoid alcohol and drug use. Some medicines that may be harmful to your baby can pass through breast milk. It is important to ask your health care provider before taking any medicine, including all over-the-counter and prescription medicine as well as vitamin and herbal supplements. It is possible to become pregnant while breastfeeding. If birth control is desired, ask your health care provider about options that will be safe for your baby. SEEK MEDICAL CARE IF:   You feel like you want to stop breastfeeding or have become frustrated with breastfeeding.  You have painful breasts or nipples.  Your nipples are cracked or bleeding.  Your breasts are red, tender, or warm.  You have a swollen area on either breast.  You have a fever or chills.  You have nausea or vomiting.  You have drainage other than breast milk from your nipples.  Your breasts do not become full before feedings by the fifth day after you give birth.  You feel sad and depressed.  Your baby is too sleepy to eat well.  Your baby is having trouble sleeping.   Your baby is wetting less than 3 diapers in a 24-hour period.  Your baby has less than 3 stools in a 24-hour period.  Your baby's skin or the white part of his or her eyes becomes yellow.   Your baby is not gaining weight by 5 days of age. SEEK IMMEDIATE MEDICAL CARE IF:   Your baby is overly tired (lethargic) and does not want to wake up and feed.  Your baby develops an unexplained fever. Document Released: 06/02/2005 Document Revised: 06/07/2013 Document Reviewed: 11/24/2012 [employer] Patient Information 2015 [employer]. This information is not intended to replace advice given to you by your health care provider. Make sure you discuss any questions you have with your health care provider.  

## 2014-01-09 NOTE — Progress Notes (Signed)
FBS 81-104 (4 of 13 out of range) 2 hour pp 83-163 (18 of 39 out of range) Just meals and no snacks--dietary adjustments only Needs baseline labs, and optho, fetal ECHO

## 2014-01-09 NOTE — Progress Notes (Signed)
[facility]  Patient has no insurance.

## 2014-01-10 ENCOUNTER — Encounter: Payer: Self-pay | Admitting: Family Medicine

## 2014-01-10 LAB — COMPREHENSIVE METABOLIC PANEL
ALT: 17 U/L (ref 0–35)
AST: 12 U/L (ref 0–37)
Albumin: 3.4 g/dL — ABNORMAL LOW (ref 3.5–5.2)
Alkaline Phosphatase: 69 U/L (ref 39–117)
BUN: 6 mg/dL (ref 6–23)
CO2: 25 mEq/L (ref 19–32)
Calcium: 8.4 mg/dL (ref 8.4–10.5)
Chloride: 104 mEq/L (ref 96–112)
Creat: 0.5 mg/dL (ref 0.50–1.10)
Glucose, Bld: 92 mg/dL (ref 70–99)
Potassium: 3.7 mEq/L (ref 3.5–5.3)
Sodium: 135 mEq/L (ref 135–145)
Total Bilirubin: 0.2 mg/dL (ref 0.2–1.2)
Total Protein: 6.2 g/dL (ref 6.0–8.3)

## 2014-01-10 LAB — TSH: TSH: 1.574 u[IU]/mL (ref 0.350–4.500)

## 2014-01-11 ENCOUNTER — Telehealth: Payer: Self-pay | Admitting: General Practice

## 2014-01-11 NOTE — Telephone Encounter (Signed)
Patient's husband called and left a message stating his wife has been having some trouble with her blood sugars and now they are even higher than before. Called patient and she states she didn't know her husband called and that her blood sugars in the morning have been a little over a hundred. Per chart review, patient reported these blood sugars on Monday and not currently on anything for diabetes. Told patient to keep checking them and they will review her blood sugars again at the next visit and to call us if they get too high. Patient verbalized understanding and had no other questions

## 2014-01-12 ENCOUNTER — Ambulatory Visit: Payer: Self-pay | Admitting: Home Health Services

## 2014-01-23 ENCOUNTER — Ambulatory Visit (INDEPENDENT_AMBULATORY_CARE_PROVIDER_SITE_OTHER): Payer: Self-pay | Admitting: Family Medicine

## 2014-01-23 VITALS — BP 112/68 | HR 77 | Temp 98.0°F | Wt 222.0 lb

## 2014-01-23 DIAGNOSIS — O24919 Unspecified diabetes mellitus in pregnancy, unspecified trimester: Secondary | ICD-10-CM

## 2014-01-23 DIAGNOSIS — O24912 Unspecified diabetes mellitus in pregnancy, second trimester: Secondary | ICD-10-CM

## 2014-01-23 DIAGNOSIS — E119 Type 2 diabetes mellitus without complications: Secondary | ICD-10-CM

## 2014-01-23 LAB — POCT URINALYSIS DIP (DEVICE)
Bilirubin Urine: NEGATIVE
Glucose, UA: NEGATIVE mg/dL
Hgb urine dipstick: NEGATIVE
Ketones, ur: NEGATIVE mg/dL
Nitrite: NEGATIVE
Protein, ur: NEGATIVE mg/dL
Specific Gravity, Urine: 1.01 (ref 1.005–1.030)
Urobilinogen, UA: 0.2 mg/dL (ref 0.0–1.0)
pH: 5.5 (ref 5.0–8.0)

## 2014-01-23 NOTE — Patient Instructions (Signed)
Gestational Diabetes Mellitus Gestational diabetes mellitus, often simply referred to as gestational diabetes, is a type of diabetes that some women develop during pregnancy. In gestational diabetes, the pancreas does not make enough insulin (a hormone), the cells are less responsive to the insulin that is made (insulin resistance), or both.Normally, insulin moves sugars from food into the tissue cells. The tissue cells use the sugars for energy. The lack of insulin or the lack of normal response to insulin causes excess sugars to build up in the blood instead of going into the tissue cells. As a result, high blood sugar (hyperglycemia) develops. The effect of high sugar (glucose) levels can cause many problems.  RISK FACTORS You have an increased chance of developing gestational diabetes if you have a family history of diabetes and also have one or more of the following risk factors:  A body mass index over 30 (obesity).  A previous pregnancy with gestational diabetes.  An older age at the time of pregnancy. If blood glucose levels are kept in the normal range during pregnancy, women can have a healthy pregnancy. If your blood glucose levels are not well controlled, there may be risks to you, your unborn baby (fetus), your labor and delivery, or your newborn baby.  SYMPTOMS  If symptoms are experienced, they are much like symptoms you would normally expect during pregnancy. The symptoms of gestational diabetes include:   Increased thirst (polydipsia).  Increased urination (polyuria).  Increased urination during the night (nocturia).  Weight loss. This weight loss may be rapid.  Frequent, recurring infections.  Tiredness (fatigue).  Weakness.  Vision changes, such as blurred vision.  Fruity smell to your breath.  Abdominal pain. DIAGNOSIS Diabetes is diagnosed when blood glucose levels are increased. Your blood glucose level may be checked by one or more of the following blood  tests:  A fasting blood glucose test. You will not be allowed to eat for at least 8 hours before a blood sample is taken.  A random blood glucose test. Your blood glucose is checked at any time of the day regardless of when you ate.  A hemoglobin A1c blood glucose test. A hemoglobin A1c test provides information about blood glucose control over the previous 3 months.  An oral glucose tolerance test (OGTT). Your blood glucose is measured after you have not eaten (fasted) for 1-3 hours and then after you drink a glucose-containing beverage. Since the hormones that cause insulin resistance are highest at about 24-28 weeks of a pregnancy, an OGTT is usually performed during that time. If you have risk factors for gestational diabetes, your health care provider may test you for gestational diabetes earlier than 24 weeks of pregnancy. TREATMENT   You will need to take diabetes medicine or insulin daily to keep blood glucose levels in the desired range.  You will need to match insulin dosing with exercise and healthy food choices. The treatment goal is to maintain the before-meal (preprandial), bedtime, and overnight blood glucose level at 60-99 mg/dL during pregnancy. The treatment goal is to further maintain peak after-meal blood sugar (postprandial glucose) level at 100-140 mg/dL. HOME CARE INSTRUCTIONS   Have your hemoglobin A1c level checked twice a year.  Perform daily blood glucose monitoring as directed by your health care provider. It is common to perform frequent blood glucose monitoring.  Monitor urine ketones when you are ill and as directed by your health care provider.  Take your diabetes medicine and insulin as directed by your health care provider   to maintain your blood glucose level in the desired range.  Never run out of diabetes medicine or insulin. It is needed every day.  Adjust insulin based on your intake of carbohydrates. Carbohydrates can raise blood glucose levels but  need to be included in your diet. Carbohydrates provide vitamins, minerals, and fiber which are an essential part of a healthy diet. Carbohydrates are found in fruits, vegetables, whole grains, dairy products, legumes, and foods containing added sugars.  Eat healthy foods. Alternate 3 meals with 3 snacks.  Maintain a healthy weight gain. The usual total expected weight gain varies according to your prepregnancy body mass index (BMI).  Carry a medical alert card or wear your medical alert jewelry.  Carry a 15-gram carbohydrate snack with you at all times to treat low blood glucose (hypoglycemia). Some examples of 15-gram carbohydrate snacks include:  Glucose tablets, 3 or 4.  Glucose gel, 15-gram tube.  Raisins, 2 tablespoons (24 g).  Jelly beans, 6.  Animal crackers, 8.  Fruit juice, regular soda, or low-fat milk, 4 ounces (120 mL).  Gummy treats, 9.  Recognize hypoglycemia. Hypoglycemia during pregnancy occurs with blood glucose levels of 60 mg/dL and below. The risk for hypoglycemia increases when fasting or skipping meals, during or after intense exercise, and during sleep. Hypoglycemia symptoms can include:  Tremors or shakes.  Decreased ability to concentrate.  Sweating.  Increased heart rate.  Headache.  Dry mouth.  Hunger.  Irritability.  Anxiety.  Restless sleep.  Altered speech or coordination.  Confusion.  Treat hypoglycemia promptly. If you are alert and able to safely swallow, follow the 15:15 rule:  Take 15-20 grams of rapid-acting glucose or carbohydrate. Rapid-acting options include glucose gel, glucose tablets, or 4 ounces (120 mL) of fruit juice, regular soda, or low-fat milk.  Check your blood glucose level 15 minutes after taking the glucose.  Take 15-20 grams more of glucose if the repeat blood glucose level is still 70 mg/dL or below.  Eat a meal or snack within 1 hour once blood glucose levels return to normal.  Be alert to polyuria  (excess urination) and polydipsia (excess thirst) which are early signs of hyperglycemia. An early awareness of hyperglycemia allows for prompt treatment. Treat hyperglycemia as directed by your health care provider.  Engage in at least 30 minutes of physical activity a day or as directed by your health care provider. Ten minutes of physical activity timed 30 minutes after each meal is encouraged to control postprandial blood glucose levels.  Adjust your insulin dosing and food intake as needed if you start a new exercise or sport.  Follow your sick-day plan at any time you are unable to eat or drink as usual.  Avoid tobacco and alcohol use.  Keep all follow-up visits as directed by your health care provider.  Follow the advice of your health care provider regarding your prenatal and post-delivery (postpartum) appointments, meal planning, exercise, medicines, vitamins, blood tests, other medical tests, and physical activities.  Perform daily skin and foot care. Examine your skin and feet daily for cuts, bruises, redness, nail problems, bleeding, blisters, or sores.  Brush your teeth and gums at least twice a day and floss at least once a day. Follow up with your dentist regularly.  Schedule an eye exam during the first trimester of your pregnancy or as directed by your health care provider.  Share your diabetes management plan with your workplace or school.  Stay up-to-date with immunizations.  Learn to manage stress.    Obtain ongoing diabetes education and support as needed.  Learn about and consider breastfeeding your baby.  You should have your blood sugar level checked 6-12 weeks after delivery. This is done with an oral glucose tolerance test (OGTT). SEEK MEDICAL CARE IF:   You are unable to eat food or drink fluids for more than 6 hours.  You have nausea and vomiting for more than 6 hours.  You have a blood glucose level of 200 mg/dL and you have ketones in your  urine.  There is a change in mental status.  You develop vision problems.  You have a persistent headache.  You have upper abdominal pain or discomfort.  You develop an additional serious illness.  You have diarrhea for more than 6 hours.  You have been sick or have had a fever for a couple of days and are not getting better. SEEK IMMEDIATE MEDICAL CARE IF:   You have difficulty breathing.  You no longer feel the baby moving.  You are bleeding or have discharge from your vagina.  You start having premature contractions or labor. MAKE SURE YOU:  Understand these instructions.  Will watch your condition.  Will get help right away if you are not doing well or get worse. Document Released: 09/08/2000 Document Revised: 10/17/2013 Document Reviewed: 12/30/2011 [employer] Patient Information 2015 [employer]. This information is not intended to replace advice given to you by your health care provider. Make sure you discuss any questions you have with your health care provider.  Second Trimester of Pregnancy The second trimester is from week 13 through week 28, months 4 through 6. The second trimester is often a time when you feel your best. Your body has also adjusted to being pregnant, and you begin to feel better physically. Usually, morning sickness has lessened or quit completely, you may have more energy, and you may have an increase in appetite. The second trimester is also a time when the fetus is growing rapidly. At the end of the sixth month, the fetus is about 9 inches long and weighs about 1 pounds. You will likely begin to feel the baby move (quickening) between 18 and 20 weeks of the pregnancy. BODY CHANGES Your body goes through many changes during pregnancy. The changes vary from woman to woman.   Your weight will continue to increase. You will notice your lower abdomen bulging out.  You may begin to get stretch marks on your hips, abdomen, and breasts.  You may  develop headaches that can be relieved by medicines approved by your health care provider.  You may urinate more often because the fetus is pressing on your bladder.  You may develop or continue to have heartburn as a result of your pregnancy.  You may develop constipation because certain hormones are causing the muscles that push waste through your intestines to slow down.  You may develop hemorrhoids or swollen, bulging veins (varicose veins).  You may have back pain because of the weight gain and pregnancy hormones relaxing your joints between the bones in your pelvis and as a result of a shift in weight and the muscles that support your balance.  Your breasts will continue to grow and be tender.  Your gums may bleed and may be sensitive to brushing and flossing.  Dark spots or blotches (chloasma, mask of pregnancy) may develop on your face. This will likely fade after the baby is born.  A dark line from your belly button to the pubic area (linea   nigra) may appear. This will likely fade after the baby is born.  You may have changes in your hair. These can include thickening of your hair, rapid growth, and changes in texture. Some women also have hair loss during or after pregnancy, or hair that feels dry or thin. Your hair will most likely return to normal after your baby is born. WHAT TO EXPECT AT YOUR PRENATAL VISITS During a routine prenatal visit:  You will be weighed to make sure you and the fetus are growing normally.  Your blood pressure will be taken.  Your abdomen will be measured to track your baby's growth.  The fetal heartbeat will be listened to.  Any test results from the previous visit will be discussed. Your health care provider may ask you:  How you are feeling.  If you are feeling the baby move.  If you have had any abnormal symptoms, such as leaking fluid, bleeding, severe headaches, or abdominal cramping.  If you have any questions. Other tests that  may be performed during your second trimester include:  Blood tests that check for:  Low iron levels (anemia).  Gestational diabetes (between 24 and 28 weeks).  Rh antibodies.  Urine tests to check for infections, diabetes, or protein in the urine.  An ultrasound to confirm the proper growth and development of the baby.  An amniocentesis to check for possible genetic problems.  Fetal screens for spina bifida and Down syndrome. HOME CARE INSTRUCTIONS   Avoid all smoking, herbs, alcohol, and unprescribed drugs. These chemicals affect the formation and growth of the baby.  Follow your health care provider's instructions regarding medicine use. There are medicines that are either safe or unsafe to take during pregnancy.  Exercise only as directed by your health care provider. Experiencing uterine cramps is a good sign to stop exercising.  Continue to eat regular, healthy meals.  Wear a good support bra for breast tenderness.  Do not use hot tubs, steam rooms, or saunas.  Wear your seat belt at all times when driving.  Avoid raw meat, uncooked cheese, cat litter boxes, and soil used by cats. These carry germs that can cause birth defects in the baby.  Take your prenatal vitamins.  Try taking a stool softener (if your health care provider approves) if you develop constipation. Eat more high-fiber foods, such as fresh vegetables or fruit and whole grains. Drink plenty of fluids to keep your urine clear or pale yellow.  Take warm sitz baths to soothe any pain or discomfort caused by hemorrhoids. Use hemorrhoid cream if your health care provider approves.  If you develop varicose veins, wear support hose. Elevate your feet for 15 minutes, 3-4 times a day. Limit salt in your diet.  Avoid heavy lifting, wear low heel shoes, and practice good posture.  Rest with your legs elevated if you have leg cramps or low back pain.  Visit your dentist if you have not gone yet during your  pregnancy. Use a soft toothbrush to brush your teeth and be gentle when you floss.  A sexual relationship may be continued unless your health care provider directs you otherwise.  Continue to go to all your prenatal visits as directed by your health care provider. SEEK MEDICAL CARE IF:   You have dizziness.  You have mild pelvic cramps, pelvic pressure, or nagging pain in the abdominal area.  You have persistent nausea, vomiting, or diarrhea.  You have a bad smelling vaginal discharge.  You have pain with   urination. SEEK IMMEDIATE MEDICAL CARE IF:   You have a fever.  You are leaking fluid from your vagina.  You have spotting or bleeding from your vagina.  You have severe abdominal cramping or pain.  You have rapid weight gain or loss.  You have shortness of breath with chest pain.  You notice sudden or extreme swelling of your face, hands, ankles, feet, or legs.  You have not felt your baby move in over an hour.  You have severe headaches that do not go away with medicine.  You have vision changes. Document Released: 05/27/2001 Document Revised: 06/07/2013 Document Reviewed: 08/03/2012 [employer] Patient Information 2015 [employer]. This information is not intended to replace advice given to you by your health care provider. Make sure you discuss any questions you have with your health care provider.  Breastfeeding Deciding to breastfeed is one of the best choices you can make for you and your baby. A change in hormones during pregnancy causes your breast tissue to grow and increases the number and size of your milk ducts. These hormones also allow proteins, sugars, and fats from your blood supply to make breast milk in your milk-producing glands. Hormones prevent breast milk from being released before your baby is born as well as prompt milk flow after birth. Once breastfeeding has begun, thoughts of your baby, as well as his or her sucking or crying, can stimulate the  release of milk from your milk-producing glands.  BENEFITS OF BREASTFEEDING For Your Baby  Your first milk (colostrum) helps your baby's digestive system function better.   There are antibodies in your milk that help your baby fight off infections.   Your baby has a lower incidence of asthma, allergies, and sudden infant death syndrome.   The nutrients in breast milk are better for your baby than infant formulas and are designed uniquely for your baby's needs.   Breast milk improves your baby's brain development.   Your baby is less likely to develop other conditions, such as childhood obesity, asthma, or type 2 diabetes mellitus.  For You   Breastfeeding helps to create a very special bond between you and your baby.   Breastfeeding is convenient. Breast milk is always available at the correct temperature and costs nothing.   Breastfeeding helps to burn calories and helps you lose the weight gained during pregnancy.   Breastfeeding makes your uterus contract to its prepregnancy size faster and slows bleeding (lochia) after you give birth.   Breastfeeding helps to lower your risk of developing type 2 diabetes mellitus, osteoporosis, and breast or ovarian cancer later in life. SIGNS THAT YOUR BABY IS HUNGRY Early Signs of Hunger  Increased alertness or activity.  Stretching.  Movement of the head from side to side.  Movement of the head and opening of the mouth when the corner of the mouth or cheek is stroked (rooting).  Increased sucking sounds, smacking lips, cooing, sighing, or squeaking.  Hand-to-mouth movements.  Increased sucking of fingers or hands. Late Signs of Hunger  Fussing.  Intermittent crying. Extreme Signs of Hunger Signs of extreme hunger will require calming and consoling before your baby will be able to breastfeed successfully. Do not wait for the following signs of extreme hunger to occur before you initiate breastfeeding:    Restlessness.  A loud, strong cry.   Screaming. BREASTFEEDING BASICS Breastfeeding Initiation  Find a comfortable place to sit or lie down, with your neck and back well supported.  Place a pillow or   rolled up blanket under your baby to bring him or her to the level of your breast (if you are seated). Nursing pillows are specially designed to help support your arms and your baby while you breastfeed.  Make sure that your baby's abdomen is facing your abdomen.   Gently massage your breast. With your fingertips, massage from your chest wall toward your nipple in a circular motion. This encourages milk flow. You may need to continue this action during the feeding if your milk flows slowly.  Support your breast with 4 fingers underneath and your thumb above your nipple. Make sure your fingers are well away from your nipple and your baby's mouth.   Stroke your baby's lips gently with your finger or nipple.   When your baby's mouth is open wide enough, quickly bring your baby to your breast, placing your entire nipple and as much of the colored area around your nipple (areola) as possible into your baby's mouth.   More areola should be visible above your baby's upper lip than below the lower lip.   Your baby's tongue should be between his or her lower gum and your breast.   Ensure that your baby's mouth is correctly positioned around your nipple (latched). Your baby's lips should create a seal on your breast and be turned out (everted).  It is common for your baby to suck about 2-3 minutes in order to start the flow of breast milk. Latching Teaching your baby how to latch on to your breast properly is very important. An improper latch can cause nipple pain and decreased milk supply for you and poor weight gain in your baby. Also, if your baby is not latched onto your nipple properly, he or she may swallow some air during feeding. This can make your baby fussy. Burping your baby when  you switch breasts during the feeding can help to get rid of the air. However, teaching your baby to latch on properly is still the best way to prevent fussiness from swallowing air while breastfeeding. Signs that your baby has successfully latched on to your nipple:    Silent tugging or silent sucking, without causing you pain.   Swallowing heard between every 3-4 sucks.    Muscle movement above and in front of his or her ears while sucking.  Signs that your baby has not successfully latched on to nipple:   Sucking sounds or smacking sounds from your baby while breastfeeding.  Nipple pain. If you think your baby has not latched on correctly, slip your finger into the corner of your baby's mouth to break the suction and place it between your baby's gums. Attempt breastfeeding initiation again. Signs of Successful Breastfeeding Signs from your baby:   A gradual decrease in the number of sucks or complete cessation of sucking.   Falling asleep.   Relaxation of his or her body.   Retention of a small amount of milk in his or her mouth.   Letting go of your breast by himself or herself. Signs from you:  Breasts that have increased in firmness, weight, and size 1-3 hours after feeding.   Breasts that are softer immediately after breastfeeding.  Increased milk volume, as well as a change in milk consistency and color by the fifth day of breastfeeding.   Nipples that are not sore, cracked, or bleeding. Signs That Your Baby is Getting Enough Milk  Wetting at least 3 diapers in a 24-hour period. The urine should be clear and pale   yellow by age 5 days.  At least 3 stools in a 24-hour period by age 5 days. The stool should be soft and yellow.  At least 3 stools in a 24-hour period by age 7 days. The stool should be seedy and yellow.  No loss of weight greater than 10% of birth weight during the first 3 days of age.  Average weight gain of 4-7 ounces (113-198 g) per week  after age 4 days.  Consistent daily weight gain by age 5 days, without weight loss after the age of 2 weeks. After a feeding, your baby may spit up a small amount. This is common. BREASTFEEDING FREQUENCY AND DURATION Frequent feeding will help you make more milk and can prevent sore nipples and breast engorgement. Breastfeed when you feel the need to reduce the fullness of your breasts or when your baby shows signs of hunger. This is called "breastfeeding on demand." Avoid introducing a pacifier to your baby while you are working to establish breastfeeding (the first 4-6 weeks after your baby is born). After this time you may choose to use a pacifier. Research has shown that pacifier use during the first year of a baby's life decreases the risk of sudden infant death syndrome (SIDS). Allow your baby to feed on each breast as long as he or she wants. Breastfeed until your baby is finished feeding. When your baby unlatches or falls asleep while feeding from the first breast, offer the second breast. Because newborns are often sleepy in the first few weeks of life, you may need to awaken your baby to get him or her to feed. Breastfeeding times will vary from baby to baby. However, the following rules can serve as a guide to help you ensure that your baby is properly fed:  Newborns (babies 4 weeks of age or younger) may breastfeed every 1-3 hours.  Newborns should not go longer than 3 hours during the day or 5 hours during the night without breastfeeding.  You should breastfeed your baby a minimum of 8 times in a 24-hour period until you begin to introduce solid foods to your baby at around 6 months of age. BREAST MILK PUMPING Pumping and storing breast milk allows you to ensure that your baby is exclusively fed your breast milk, even at times when you are unable to breastfeed. This is especially important if you are going back to work while you are still breastfeeding or when you are not able to be  present during feedings. Your lactation consultant can give you guidelines on how long it is safe to store breast milk.  A breast pump is a machine that allows you to pump milk from your breast into a sterile bottle. The pumped breast milk can then be stored in a refrigerator or freezer. Some breast pumps are operated by hand, while others use electricity. Ask your lactation consultant which type will work best for you. Breast pumps can be purchased, but some hospitals and breastfeeding support groups lease breast pumps on a monthly basis. A lactation consultant can teach you how to hand express breast milk, if you prefer not to use a pump.  CARING FOR YOUR BREASTS WHILE YOU BREASTFEED Nipples can become dry, cracked, and sore while breastfeeding. The following recommendations can help keep your breasts moisturized and healthy:  Avoid using soap on your nipples.   Wear a supportive bra. Although not required, special nursing bras and tank tops are designed to allow access to your breasts for breastfeeding   without taking off your entire bra or top. Avoid wearing underwire-style bras or extremely tight bras.  Air dry your nipples for 3-4minutes after each feeding.   Use only cotton bra pads to absorb leaked breast milk. Leaking of breast milk between feedings is normal.   Use lanolin on your nipples after breastfeeding. Lanolin helps to maintain your skin's normal moisture barrier. If you use pure lanolin, you do not need to wash it off before feeding your baby again. Pure lanolin is not toxic to your baby. You may also hand express a few drops of breast milk and gently massage that milk into your nipples and allow the milk to air dry. In the first few weeks after giving birth, some women experience extremely full breasts (engorgement). Engorgement can make your breasts feel heavy, warm, and tender to the touch. Engorgement peaks within 3-5 days after you give birth. The following recommendations can  help ease engorgement:  Completely empty your breasts while breastfeeding or pumping. You may want to start by applying warm, moist heat (in the shower or with warm water-soaked hand towels) just before feeding or pumping. This increases circulation and helps the milk flow. If your baby does not completely empty your breasts while breastfeeding, pump any extra milk after he or she is finished.  Wear a snug bra (nursing or regular) or tank top for 1-2 days to signal your body to slightly decrease milk production.  Apply ice packs to your breasts, unless this is too uncomfortable for you.  Make sure that your baby is latched on and positioned properly while breastfeeding. If engorgement persists after 48 hours of following these recommendations, contact your health care provider or a lactation consultant. OVERALL HEALTH CARE RECOMMENDATIONS WHILE BREASTFEEDING  Eat healthy foods. Alternate between meals and snacks, eating 3 of each per day. Because what you eat affects your breast milk, some of the foods may make your baby more irritable than usual. Avoid eating these foods if you are sure that they are negatively affecting your baby.  Drink milk, fruit juice, and water to satisfy your thirst (about 10 glasses a day).   Rest often, relax, and continue to take your prenatal vitamins to prevent fatigue, stress, and anemia.  Continue breast self-awareness checks.  Avoid chewing and smoking tobacco.  Avoid alcohol and drug use. Some medicines that may be harmful to your baby can pass through breast milk. It is important to ask your health care provider before taking any medicine, including all over-the-counter and prescription medicine as well as vitamin and herbal supplements. It is possible to become pregnant while breastfeeding. If birth control is desired, ask your health care provider about options that will be safe for your baby. SEEK MEDICAL CARE IF:   You feel like you want to stop  breastfeeding or have become frustrated with breastfeeding.  You have painful breasts or nipples.  Your nipples are cracked or bleeding.  Your breasts are red, tender, or warm.  You have a swollen area on either breast.  You have a fever or chills.  You have nausea or vomiting.  You have drainage other than breast milk from your nipples.  Your breasts do not become full before feedings by the fifth day after you give birth.  You feel sad and depressed.  Your baby is too sleepy to eat well.  Your baby is having trouble sleeping.   Your baby is wetting less than 3 diapers in a 24-hour period.  Your baby has   less than 3 stools in a 24-hour period.  Your baby's skin or the white part of his or her eyes becomes yellow.   Your baby is not gaining weight by 5 days of age. SEEK IMMEDIATE MEDICAL CARE IF:   Your baby is overly tired (lethargic) and does not want to wake up and feed.  Your baby develops an unexplained fever. Document Released: 06/02/2005 Document Revised: 06/07/2013 Document Reviewed: 11/24/2012 Exit[employer] Patient Information 2015 [employer]. This information is not intended to replace advice given to you by your health care provider. Make sure you discuss any questions you have with your health care provider.  

## 2014-01-23 NOTE — Progress Notes (Signed)
Reports good fetal movement FBS 80-120 (12 of 14 out of range)--most in 90-105 range 2 hr pp 75-157 (13 of 41 out of range) Most fastings out, but no bedtime snack BS look better--is exercising Does not want medication at this time--advised that she will likely need it. Schedule us for growth next visit

## 2014-01-24 ENCOUNTER — Encounter: Payer: Self-pay | Admitting: Family Medicine

## 2014-01-24 LAB — PROTEIN / CREATININE RATIO, URINE
Creatinine, Urine: 69.8 mg/dL
Total Protein, Urine: <3 mg/dL

## 2014-02-02 ENCOUNTER — Ambulatory Visit: Payer: Self-pay | Admitting: Home Health Services

## 2014-02-06 ENCOUNTER — Ambulatory Visit (INDEPENDENT_AMBULATORY_CARE_PROVIDER_SITE_OTHER): Payer: Self-pay | Admitting: Obstetrics & Gynecology

## 2014-02-06 VITALS — BP 114/65 | HR 74 | Temp 97.7°F | Wt 223.4 lb

## 2014-02-06 DIAGNOSIS — E119 Type 2 diabetes mellitus without complications: Secondary | ICD-10-CM

## 2014-02-06 DIAGNOSIS — O0990 Supervision of high risk pregnancy, unspecified, unspecified trimester: Secondary | ICD-10-CM

## 2014-02-06 DIAGNOSIS — O24919 Unspecified diabetes mellitus in pregnancy, unspecified trimester: Secondary | ICD-10-CM

## 2014-02-06 DIAGNOSIS — O24912 Unspecified diabetes mellitus in pregnancy, second trimester: Secondary | ICD-10-CM

## 2014-02-06 LAB — POCT URINALYSIS DIP (DEVICE)
Bilirubin Urine: NEGATIVE
Glucose, UA: NEGATIVE mg/dL
Hgb urine dipstick: NEGATIVE
Ketones, ur: NEGATIVE mg/dL
Nitrite: NEGATIVE
Protein, ur: NEGATIVE mg/dL
Specific Gravity, Urine: 1.01 (ref 1.005–1.030)
Urobilinogen, UA: 0.2 mg/dL (ref 0.0–1.0)
pH: 7 (ref 5.0–8.0)

## 2014-02-06 MED ORDER — GLYBURIDE 2.5 MG PO TABS: 1.2500 mg | ORAL_TABLET | Freq: Every day | ORAL | Status: DC

## 2014-02-06 NOTE — Patient Instructions (Signed)
Return to clinic for any obstetric concerns or go to [facility] for evaluation  

## 2014-02-06 NOTE — Progress Notes (Signed)
Fasting 82-117 (4 values >100) 2 hr PP only one abnormal value at dinner of 148 Given persistent elevated fasting, will start Glyburide 1.25 mg po qhs.  Will reevaluate in one week. Follow up growth scan scheduled around 28 weeks No other complaints or concerns.  Labor and fetal movement precautions reviewed.

## 2014-02-08 ENCOUNTER — Encounter: Payer: Self-pay | Admitting: *Deleted

## 2014-02-09 ENCOUNTER — Ambulatory Visit (INDEPENDENT_AMBULATORY_CARE_PROVIDER_SITE_OTHER): Payer: Self-pay | Admitting: Home Health Services

## 2014-02-09 DIAGNOSIS — O24919 Unspecified diabetes mellitus in pregnancy, unspecified trimester: Secondary | ICD-10-CM

## 2014-02-09 NOTE — Progress Notes (Signed)
DIABETES Pt came in to have a retinal scan per diabetic care.   Image was taken and submitted to [facility]. Garg for reading.    Results will be available in 1-2 weeks.  Results will be given to PCP for review and to contact patient.  Kasee Hantz  

## 2014-02-13 ENCOUNTER — Ambulatory Visit (INDEPENDENT_AMBULATORY_CARE_PROVIDER_SITE_OTHER): Payer: Self-pay | Admitting: Obstetrics & Gynecology

## 2014-02-13 VITALS — BP 109/62 | HR 69 | Temp 97.8°F | Wt 225.2 lb

## 2014-02-13 DIAGNOSIS — E119 Type 2 diabetes mellitus without complications: Secondary | ICD-10-CM

## 2014-02-13 DIAGNOSIS — O24919 Unspecified diabetes mellitus in pregnancy, unspecified trimester: Secondary | ICD-10-CM

## 2014-02-13 DIAGNOSIS — O24912 Unspecified diabetes mellitus in pregnancy, second trimester: Secondary | ICD-10-CM

## 2014-02-13 LAB — POCT URINALYSIS DIP (DEVICE)
Bilirubin Urine: NEGATIVE
Glucose, UA: NEGATIVE mg/dL
Hgb urine dipstick: NEGATIVE
Ketones, ur: NEGATIVE mg/dL
Nitrite: NEGATIVE
Protein, ur: NEGATIVE mg/dL
Specific Gravity, Urine: 1.015 (ref 1.005–1.030)
Urobilinogen, UA: 0.2 mg/dL (ref 0.0–1.0)
pH: 7 (ref 5.0–8.0)

## 2014-02-13 NOTE — Progress Notes (Signed)
Reports sharp cramp in back of left calf while sleeping the other night and it was sore the next day; also reports pressure near left groin. Negative Homan's sign; patient reassured. On BS review, all values except one postpranial lunch (142) were within range; continue Glyburide 1.25 mg po qhs and diet Will reevaluate in 2 weeks. Preterm labor and fetal movement precautions reviewed.

## 2014-02-13 NOTE — Patient Instructions (Signed)
Return to clinic for any obstetric concerns or go to [facility] for evaluation  

## 2014-02-23 ENCOUNTER — Ambulatory Visit (HOSPITAL_COMMUNITY)
Admission: RE | Admit: 2014-02-23 | Discharge: 2014-02-23 | Disposition: A | Discharge status: Home / Self Care | Payer: Self-pay | Source: Ambulatory Visit | Attending: Obstetrics & Gynecology | Admitting: Obstetrics & Gynecology

## 2014-02-23 DIAGNOSIS — O24919 Unspecified diabetes mellitus in pregnancy, unspecified trimester: Secondary | ICD-10-CM | POA: Insufficient documentation

## 2014-02-23 DIAGNOSIS — O24912 Unspecified diabetes mellitus in pregnancy, second trimester: Secondary | ICD-10-CM

## 2014-02-23 DIAGNOSIS — E119 Type 2 diabetes mellitus without complications: Secondary | ICD-10-CM | POA: Insufficient documentation

## 2014-02-23 DIAGNOSIS — Z3689 Encounter for other specified antenatal screening: Secondary | ICD-10-CM | POA: Insufficient documentation

## 2014-02-24 ENCOUNTER — Encounter: Payer: Self-pay | Admitting: Obstetrics & Gynecology

## 2014-02-27 ENCOUNTER — Ambulatory Visit (INDEPENDENT_AMBULATORY_CARE_PROVIDER_SITE_OTHER): Payer: Self-pay | Admitting: Obstetrics and Gynecology

## 2014-02-27 VITALS — BP 125/58 | HR 73 | Temp 98.4°F | Wt 227.8 lb

## 2014-02-27 DIAGNOSIS — Z23 Encounter for immunization: Secondary | ICD-10-CM

## 2014-02-27 DIAGNOSIS — O24912 Unspecified diabetes mellitus in pregnancy, second trimester: Secondary | ICD-10-CM

## 2014-02-27 DIAGNOSIS — E119 Type 2 diabetes mellitus without complications: Secondary | ICD-10-CM

## 2014-02-27 DIAGNOSIS — O24919 Unspecified diabetes mellitus in pregnancy, unspecified trimester: Secondary | ICD-10-CM

## 2014-02-27 LAB — POCT URINALYSIS DIP (DEVICE)
Bilirubin Urine: NEGATIVE
Glucose, UA: NEGATIVE mg/dL
Hgb urine dipstick: NEGATIVE
Ketones, ur: NEGATIVE mg/dL
Nitrite: NEGATIVE
Protein, ur: NEGATIVE mg/dL
Specific Gravity, Urine: 1.015 (ref 1.005–1.030)
Urobilinogen, UA: 0.2 mg/dL (ref 0.0–1.0)
pH: 7 (ref 5.0–8.0)

## 2014-02-27 MED ORDER — TETANUS-DIPHTH-ACELL PERTUSSIS 5-2.5-18.5 LF-MCG/0.5 IM SUSP: 0.5000 mL | Freq: Once | INTRAMUSCULAR | Status: DC

## 2014-02-27 NOTE — Progress Notes (Signed)
Patient would like tdap. Declines flu shot for now.

## 2014-02-27 NOTE — Progress Notes (Signed)
AM fasting: 80 - 87 1 hr pp: 79-118 Continue glyburide 1.25 at night Tdap and flu today

## 2014-02-28 LAB — OBSTETRIC PANEL
Antibody Screen: NEGATIVE
Basophils Absolute: 0 10*3/uL (ref 0.0–0.1)
Basophils Relative: 0 % (ref 0–1)
Eosinophils Absolute: 0.3 10*3/uL (ref 0.0–0.7)
Eosinophils Relative: 3 % (ref 0–5)
HCT: 34.9 % — ABNORMAL LOW (ref 36.0–46.0)
Hemoglobin: 12.5 g/dL (ref 12.0–15.0)
Hepatitis B Surface Ag: NEGATIVE
Lymphocytes Relative: 16 % (ref 12–46)
Lymphs Abs: 1.6 10*3/uL (ref 0.7–4.0)
MCH: 32.6 pg (ref 26.0–34.0)
MCHC: 35.8 g/dL (ref 30.0–36.0)
MCV: 90.9 fL (ref 78.0–100.0)
Monocytes Absolute: 0.7 10*3/uL (ref 0.1–1.0)
Monocytes Relative: 7 % (ref 3–12)
Neutro Abs: 7.3 10*3/uL (ref 1.7–7.7)
Neutrophils Relative %: 74 % (ref 43–77)
Platelets: 185 10*3/uL (ref 150–400)
RBC: 3.84 MIL/uL — ABNORMAL LOW (ref 3.87–5.11)
RDW: 13.8 % (ref 11.5–15.5)
Rh Type: POSITIVE
Rubella: 4.46 Index — ABNORMAL HIGH (ref <0.90)
WBC: 9.8 10*3/uL (ref 4.0–10.5)

## 2014-02-28 LAB — HIV ANTIBODY (ROUTINE TESTING W REFLEX): HIV 1&2 Ab, 4th Generation: NONREACTIVE

## 2014-03-20 ENCOUNTER — Ambulatory Visit (INDEPENDENT_AMBULATORY_CARE_PROVIDER_SITE_OTHER): Payer: Self-pay | Admitting: Family Medicine

## 2014-03-20 VITALS — BP 117/65 | HR 71 | Temp 97.7°F | Wt 231.5 lb

## 2014-03-20 DIAGNOSIS — O24912 Unspecified diabetes mellitus in pregnancy, second trimester: Secondary | ICD-10-CM

## 2014-03-20 LAB — POCT URINALYSIS DIP (DEVICE)
Bilirubin Urine: NEGATIVE
Glucose, UA: NEGATIVE mg/dL
Hgb urine dipstick: NEGATIVE
Ketones, ur: NEGATIVE mg/dL
Nitrite: NEGATIVE
Protein, ur: NEGATIVE mg/dL
Specific Gravity, Urine: 1.015 (ref 1.005–1.030)
Urobilinogen, UA: 0.2 mg/dL (ref 0.0–1.0)
pH: 7 (ref 5.0–8.0)

## 2014-03-20 NOTE — Progress Notes (Signed)
Fasting blood sugar - 3/21 elevated.  2 hr PP 6/63 elevated.  Continue glyburide. Good fetal activity.  No ctxs.  Schedule f/u growth in 3 weeks. Start twice weekly testing.

## 2014-03-20 NOTE — Progress Notes (Signed)
Scheduled ultrasound for 10/27 @ 10:15

## 2014-03-20 NOTE — Patient Instructions (Signed)
Third Trimester of Pregnancy The third trimester is from week 29 through week 42, months 7 through 9. This trimester is when your unborn baby (fetus) is growing very fast. At the end of the ninth month, the unborn baby is about 20 inches in length. It weighs about 6-10 pounds.  HOME CARE   Avoid all smoking, herbs, and alcohol. Avoid drugs not approved by your doctor.  Only take medicine as told by your doctor. Some medicines are safe and some are not during pregnancy.  Exercise only as told by your doctor. Stop exercising if you start having cramps.  Eat regular, healthy meals.  Wear a good support bra if your breasts are tender.  Do not use hot tubs, steam rooms, or saunas.  Wear your seat belt when driving.  Avoid raw meat, uncooked cheese, and liter boxes and soil used by cats.  Take your prenatal vitamins.  Try taking medicine that helps you poop (stool softener) as needed, and if your doctor approves. Eat more fiber by eating fresh fruit, vegetables, and whole grains. Drink enough fluids to keep your pee (urine) clear or pale yellow.  Take warm water baths (sitz baths) to soothe pain or discomfort caused by hemorrhoids. Use hemorrhoid cream if your doctor approves.  If you have puffy, bulging veins (varicose veins), wear support hose. Raise (elevate) your feet for 15 minutes, 3-4 times a day. Limit salt in your diet.  Avoid heavy lifting, wear low heels, and sit up straight.  Rest with your legs raised if you have leg cramps or low back pain.  Visit your dentist if you have not gone during your pregnancy. Use a soft toothbrush to brush your teeth. Be gentle when you floss.  You can have sex (intercourse) unless your doctor tells you not to.  Do not travel far distances unless you must. Only do so with your doctor's approval.  Take prenatal classes.  Practice driving to the hospital.  Pack your hospital bag.  Prepare the baby's room.  Go to your doctor visits. GET  HELP IF:  You are not sure if you are in labor or if your water has broken.  You are dizzy.  You have mild cramps or pressure in your lower belly (abdominal).  You have a nagging pain in your belly area.  You continue to feel sick to your stomach (nauseous), throw up (vomit), or have watery poop (diarrhea).  You have bad smelling fluid coming from your vagina.  You have pain with peeing (urination). GET HELP RIGHT AWAY IF:   You have a fever.  You are leaking fluid from your vagina.  You are spotting or bleeding from your vagina.  You have severe belly cramping or pain.  You lose or gain weight rapidly.  You have trouble catching your breath and have chest pain.  You notice sudden or extreme puffiness (swelling) of your face, hands, ankles, feet, or legs.  You have not felt the baby move in over an hour.  You have severe headaches that do not go away with medicine.  You have vision changes. Document Released: 08/27/2009 Document Revised: 09/27/2012 Document Reviewed: 08/03/2012 Exit[employer] Patient Information 2015 [employer]. This information is not intended to replace advice given to you by your health care provider. Make sure you discuss any questions you have with your health care provider.  

## 2014-03-20 NOTE — Progress Notes (Signed)
C/o of bilateral hip/thigh pain.

## 2014-03-23 ENCOUNTER — Emergency Department (HOSPITAL_COMMUNITY): Payer: Self-pay

## 2014-03-23 ENCOUNTER — Inpatient Hospital Stay (HOSPITAL_COMMUNITY)
Admission: EM | Admit: 2014-03-23 | Discharge: 2014-03-24 | DRG: 781 | Disposition: A | Discharge status: Home / Self Care | Payer: Self-pay | Attending: Obstetrics & Gynecology | Admitting: Obstetrics & Gynecology

## 2014-03-23 ENCOUNTER — Other Ambulatory Visit: Payer: Self-pay

## 2014-03-23 ENCOUNTER — Encounter (HOSPITAL_COMMUNITY): Payer: Self-pay | Admitting: Emergency Medicine

## 2014-03-23 DIAGNOSIS — Z3A32 32 weeks gestation of pregnancy: Secondary | ICD-10-CM | POA: Diagnosis present

## 2014-03-23 DIAGNOSIS — R112 Nausea with vomiting, unspecified: Secondary | ICD-10-CM

## 2014-03-23 DIAGNOSIS — K829 Disease of gallbladder, unspecified: Secondary | ICD-10-CM

## 2014-03-23 DIAGNOSIS — N390 Urinary tract infection, site not specified: Secondary | ICD-10-CM

## 2014-03-23 DIAGNOSIS — Z8249 Family history of ischemic heart disease and other diseases of the circulatory system: Secondary | ICD-10-CM

## 2014-03-23 DIAGNOSIS — O24410 Gestational diabetes mellitus in pregnancy, diet controlled: Secondary | ICD-10-CM | POA: Diagnosis present

## 2014-03-23 DIAGNOSIS — O99613 Diseases of the digestive system complicating pregnancy, third trimester: Principal | ICD-10-CM | POA: Diagnosis present

## 2014-03-23 DIAGNOSIS — K8020 Calculus of gallbladder without cholecystitis without obstruction: Secondary | ICD-10-CM

## 2014-03-23 DIAGNOSIS — O26613 Liver and biliary tract disorders in pregnancy, third trimester: Secondary | ICD-10-CM

## 2014-03-23 DIAGNOSIS — O24912 Unspecified diabetes mellitus in pregnancy, second trimester: Secondary | ICD-10-CM

## 2014-03-23 LAB — URINALYSIS, ROUTINE W REFLEX MICROSCOPIC
Bilirubin Urine: NEGATIVE
Glucose, UA: NEGATIVE mg/dL
Hgb urine dipstick: NEGATIVE
Nitrite: NEGATIVE
Protein, ur: NEGATIVE mg/dL
Specific Gravity, Urine: 1.025 (ref 1.005–1.030)
Urobilinogen, UA: 0.2 mg/dL (ref 0.0–1.0)
pH: 7 (ref 5.0–8.0)

## 2014-03-23 LAB — CBC WITH DIFFERENTIAL/PLATELET
Basophils Absolute: 0 10*3/uL (ref 0.0–0.1)
Basophils Relative: 0 % (ref 0–1)
Eosinophils Absolute: 0.1 10*3/uL (ref 0.0–0.7)
Eosinophils Relative: 1 % (ref 0–5)
HCT: 37.1 % (ref 36.0–46.0)
Hemoglobin: 13.4 g/dL (ref 12.0–15.0)
Lymphocytes Relative: 17 % (ref 12–46)
Lymphs Abs: 1.8 10*3/uL (ref 0.7–4.0)
MCH: 33.4 pg (ref 26.0–34.0)
MCHC: 36.1 g/dL — ABNORMAL HIGH (ref 30.0–36.0)
MCV: 92.5 fL (ref 78.0–100.0)
Monocytes Absolute: 0.8 10*3/uL (ref 0.1–1.0)
Monocytes Relative: 7 % (ref 3–12)
Neutro Abs: 8.4 10*3/uL — ABNORMAL HIGH (ref 1.7–7.7)
Neutrophils Relative %: 75 % (ref 43–77)
Platelets: 189 10*3/uL (ref 150–400)
RBC: 4.01 MIL/uL (ref 3.87–5.11)
RDW: 13.1 % (ref 11.5–15.5)
WBC: 11.1 10*3/uL — ABNORMAL HIGH (ref 4.0–10.5)

## 2014-03-23 LAB — GLUCOSE, CAPILLARY: Glucose-Capillary: 103 mg/dL — ABNORMAL HIGH (ref 70–99)

## 2014-03-23 LAB — URINE MICROSCOPIC-ADD ON

## 2014-03-23 LAB — CBG MONITORING, ED: Glucose-Capillary: 126 mg/dL — ABNORMAL HIGH (ref 70–99)

## 2014-03-23 LAB — GLUCOSE, RANDOM: Glucose, Bld: 92 mg/dL (ref 70–99)

## 2014-03-23 LAB — COMPREHENSIVE METABOLIC PANEL
ALT: 14 U/L (ref 0–35)
AST: 14 U/L (ref 0–37)
Albumin: 2.8 g/dL — ABNORMAL LOW (ref 3.5–5.2)
Alkaline Phosphatase: 122 U/L — ABNORMAL HIGH (ref 39–117)
Anion gap: 15 (ref 5–15)
BUN: 7 mg/dL (ref 6–23)
CO2: 21 mEq/L (ref 19–32)
Calcium: 8.7 mg/dL (ref 8.4–10.5)
Chloride: 100 mEq/L (ref 96–112)
Creatinine, Ser: 0.56 mg/dL (ref 0.50–1.10)
GFR calc Af Amer: >90 mL/min (ref >90)
GFR calc non Af Amer: >90 mL/min (ref >90)
Glucose, Bld: 130 mg/dL — ABNORMAL HIGH (ref 70–99)
Potassium: 3.8 mEq/L (ref 3.7–5.3)
Sodium: 136 mEq/L — ABNORMAL LOW (ref 137–147)
Total Bilirubin: 0.2 mg/dL — ABNORMAL LOW (ref 0.3–1.2)
Total Protein: 7.2 g/dL (ref 6.0–8.3)

## 2014-03-23 LAB — LIPASE, BLOOD: Lipase: 35 U/L (ref 11–59)

## 2014-03-23 LAB — PROTEIN / CREATININE RATIO, URINE
Creatinine, Urine: 127.67 mg/dL
Protein Creatinine Ratio: 0.11 (ref 0.00–0.15)
Total Protein, Urine: 14.1 mg/dL

## 2014-03-23 MED ORDER — SODIUM CHLORIDE 0.9 % IV SOLN
INTRAVENOUS | Status: DC
  Administered 2014-03-23: 125 mL/h via INTRAVENOUS

## 2014-03-23 MED ORDER — GLYBURIDE 1.25 MG PO TABS
1.2500 mg | ORAL_TABLET | Freq: Every day | ORAL | Status: DC
  Administered 2014-03-23: 22:00:00 via ORAL
  Filled 2014-03-23: qty 1

## 2014-03-23 MED ORDER — OXYCODONE-ACETAMINOPHEN 5-325 MG PO TABS: 2.0000 | ORAL_TABLET | ORAL | Status: DC | PRN

## 2014-03-23 MED ORDER — ASPIRIN EC 81 MG PO TBEC
81.0000 mg | DELAYED_RELEASE_TABLET | Freq: Every day | ORAL | Status: DC
  Administered 2014-03-23: 81 mg via ORAL
  Filled 2014-03-23 (×2): qty 1

## 2014-03-23 MED ORDER — ONDANSETRON HCL 4 MG/2ML IJ SOLN
4.0000 mg | Freq: Once | INTRAMUSCULAR | Status: AC
  Administered 2014-03-23: 4 mg via INTRAVENOUS
  Filled 2014-03-23: qty 2

## 2014-03-23 MED ORDER — ONDANSETRON HCL 4 MG/2ML IJ SOLN
INTRAMUSCULAR | Status: AC
Start: 2014-03-23 — End: 2014-03-23
  Filled 2014-03-23: qty 2

## 2014-03-23 MED ORDER — ONDANSETRON HCL 4 MG/2ML IJ SOLN: 4.0000 mg | Freq: Once | INTRAMUSCULAR | Status: DC

## 2014-03-23 MED ORDER — ONDANSETRON HCL 4 MG/2ML IJ SOLN
4.0000 mg | Freq: Once | INTRAMUSCULAR | Status: AC
  Administered 2014-03-23: 4 mg via INTRAVENOUS

## 2014-03-23 MED ORDER — CALCIUM CARBONATE ANTACID 500 MG PO CHEW
2.0000 | CHEWABLE_TABLET | ORAL | Status: DC | PRN
  Administered 2014-03-23 (×2): 400 mg via ORAL
  Filled 2014-03-23 (×3): qty 1

## 2014-03-23 MED ORDER — DOCUSATE SODIUM 100 MG PO CAPS: 100.0000 mg | ORAL_CAPSULE | Freq: Every day | ORAL | Status: DC

## 2014-03-23 MED ORDER — ACETAMINOPHEN 325 MG PO TABS: 650.0000 mg | ORAL_TABLET | ORAL | Status: DC | PRN

## 2014-03-23 MED ORDER — ONDANSETRON HCL 4 MG/2ML IJ SOLN
4.0000 mg | Freq: Four times a day (QID) | INTRAMUSCULAR | Status: DC | PRN
  Administered 2014-03-23: 4 mg via INTRAVENOUS
  Filled 2014-03-23: qty 2

## 2014-03-23 MED ORDER — ONDANSETRON 4 MG PO TBDP
8.0000 mg | ORAL_TABLET | Freq: Three times a day (TID) | ORAL | Status: DC | PRN
  Administered 2014-03-23: 8 mg via ORAL
  Filled 2014-03-23: qty 2

## 2014-03-23 MED ORDER — ZOLPIDEM TARTRATE 5 MG PO TABS: 5.0000 mg | ORAL_TABLET | Freq: Every evening | ORAL | Status: DC | PRN

## 2014-03-23 MED ORDER — MORPHINE SULFATE 4 MG/ML IJ SOLN
4.0000 mg | Freq: Once | INTRAMUSCULAR | Status: AC
Start: 2014-03-23 — End: 2014-03-23
  Administered 2014-03-23: 4 mg via INTRAVENOUS
  Filled 2014-03-23: qty 1

## 2014-03-23 MED ORDER — LACTATED RINGERS IV SOLN: INTRAVENOUS | Status: DC

## 2014-03-23 MED ORDER — HYDROMORPHONE HCL 1 MG/ML IJ SOLN
0.5000 mg | Freq: Once | INTRAMUSCULAR | Status: AC
  Administered 2014-03-23: 0.5 mg via INTRAVENOUS
  Filled 2014-03-23: qty 1

## 2014-03-23 MED ORDER — SODIUM CHLORIDE 0.9 % IV SOLN
INTRAVENOUS | Status: DC
  Administered 2014-03-23: 04:00:00 via INTRAVENOUS

## 2014-03-23 MED ORDER — HYDROMORPHONE HCL 1 MG/ML IJ SOLN: 1.0000 mg | Freq: Once | INTRAMUSCULAR | Status: DC

## 2014-03-23 MED ORDER — DEXTROSE 5 % IV SOLN
1.0000 g | Freq: Once | INTRAVENOUS | Status: AC
  Administered 2014-03-23: 1 g via INTRAVENOUS
  Filled 2014-03-23: qty 10

## 2014-03-23 MED ORDER — SODIUM CHLORIDE 0.9 % IV SOLN: INTRAVENOUS | Status: DC

## 2014-03-23 MED ORDER — MORPHINE SULFATE 4 MG/ML IJ SOLN
4.0000 mg | Freq: Once | INTRAMUSCULAR | Status: AC
  Administered 2014-03-23: 4 mg via INTRAVENOUS
  Filled 2014-03-23: qty 1

## 2014-03-23 MED ORDER — HYDROMORPHONE HCL 2 MG/ML IJ SOLN
2.0000 mg | INTRAMUSCULAR | Status: DC | PRN
  Administered 2014-03-23: 2 mg via INTRAVENOUS
  Filled 2014-03-23: qty 1

## 2014-03-23 MED ORDER — SODIUM CHLORIDE 0.9 % IJ SOLN
3.0000 mL | Freq: Two times a day (BID) | INTRAMUSCULAR | Status: DC
  Administered 2014-03-23: 3 mL via INTRAVENOUS

## 2014-03-23 MED ORDER — SODIUM CHLORIDE 0.9 % IV BOLUS (SEPSIS)
1000.0000 mL | Freq: Once | INTRAVENOUS | Status: AC
  Administered 2014-03-23: 1000 mL via INTRAVENOUS

## 2014-03-23 MED ORDER — PRENATAL MULTIVITAMIN CH: 1.0000 | ORAL_TABLET | Freq: Every day | ORAL | Status: DC

## 2014-03-23 NOTE — ED Notes (Signed)
[facility] arrived at this time. 

## 2014-03-23 NOTE — ED Notes (Signed)
Dr. Ward at bedside.

## 2014-03-23 NOTE — ED Provider Notes (Addendum)
TIME SEEN: 2:55 AM  CHIEF COMPLAINT: Abdominal pain, vomiting  HPI: Patient is a 28 year old female G1 P0 LMP 08/10/13 with history of gallstones, gestational diabetes who is currently 8 months pregnant due to deliver on December 2 who is followed by Dr. Shawnie Pons with OB/GYN who presents emergency department with right upper quadrant pain, nausea and vomiting that started around 8 PM tonight after eating something spicy. She reports this is exactly like her prior episodes of biliary colic. She states she was diagnosed with gallstones and the interval [facility] several months ago. She denies fevers but states she has had chills. No diarrhea. Last bowel movement was today was normal. No hematochezia or melena. No dysuria, hematuria, urinary frequency or urgency, vaginal bleeding or discharge, leaking fluid. No contractions. She is feeling her baby move regularly. No sick contacts or recent travel.  ROS: See HPI Constitutional: no fever  Eyes: no drainage  ENT: no runny nose   Cardiovascular:  no chest pain  Resp: no SOB  GI: vomiting GU: no dysuria Integumentary: no rash  Allergy: no hives  Musculoskeletal: no leg swelling  Neurological: no slurred speech ROS otherwise negative  PAST MEDICAL HISTORY/PAST SURGICAL HISTORY:  Past Medical History  Diagnosis Date  . Heart murmur   . Gall stones     during 1st trimester of pregnancy    MEDICATIONS:  Prior to Admission medications   Medication Sig Start Date End Date Taking? Authorizing Provider  aspirin EC 81 MG tablet Take 1 tablet (81 mg total) by mouth daily. 12/26/13  Yes Vale Haven, MD  calcium carbonate (TUMS EX) 750 MG chewable tablet Chew 1 tablet by mouth daily.   Yes Historical Provider, MD  glyBURIDE (DIABETA) 2.5 MG tablet Take 0.5 tablets (1.25 mg total) by mouth at bedtime. 02/06/14  Yes Tereso Newcomer, MD  HYDROcodone-acetaminophen (NORCO/VICODIN) 5-325 MG per tablet Take 1 tablet by mouth every 6 (six) hours as  needed for moderate pain.   Yes Historical Provider, MD  Prenatal Vit-Fe Fumarate-FA (PRENATAL MULTIVITAMIN) TABS tablet Take 1 tablet by mouth daily at 12 noon.   Yes Historical Provider, MD    ALLERGIES:  No Known Allergies  SOCIAL HISTORY:  History  Substance Use Topics  . Smoking status: Never Smoker   . Smokeless tobacco: Not on file  . Alcohol Use: No    FAMILY HISTORY: Family History  Problem Relation Age of Onset  . Hypertension Mother     EXAM: BP 116/88  Pulse 92  Resp 20  Ht 5\' 2"  (1.575 m)  Wt 231 lb (104.781 kg)  BMI 42.24 kg/m2  SpO2 100%  LMP 08/10/2013 CONSTITUTIONAL: Alert and oriented and responds appropriately to questions. Well-appearing; well-nourished HEAD: Normocephalic EYES: Conjunctivae clear, PERRL ENT: normal nose; no rhinorrhea; moist mucous membranes; pharynx without lesions noted NECK: Supple, no meningismus, no LAD  CARD: RRR; S1 and S2 appreciated; no murmurs, no clicks, no rubs, no gallops RESP: Normal chest excursion without splinting or tachypnea; breath sounds clear and equal bilaterally; no wheezes, no rhonchi, no rales,  ABD/GI: Normal bowel sounds; non-distended; soft, tender to palpation in the right upper quadrant with voluntary guarding, positive Murphy sign, no peritoneal signs, gravid uterus BACK:  The back appears normal and is non-tender to palpation, there is no CVA tenderness EXT: Normal ROM in all joints; non-tender to palpation; no edema; normal capillary refill; no cyanosis    SKIN: Normal color for age and race; warm NEURO: Moves all extremities equally  PSYCH: The patient's mood and manner are appropriate. Grooming and personal hygiene are appropriate.  MEDICAL DECISION MAKING: Patient here with complaints of pain and vomiting similar to her episodes of biliary colic. She is afebrile in the ED. She states she has had chills at home but no fevers. Unable to obtain a repeat ultrasound tonight but doubt cholecystitis. We'll  obtain CBC, CMP, lipase, urine. Will obtain fetal heart tones. We'll give IV fluids, Zofran and morphine. Discussed with patient that morphine will cross the placenta but I feel she will need something stronger than Tylenol for pain control. She verbalized understanding is comfortable with this plan.  ED PROGRESS: Does have a leukocytosis of 11.1 with left shift. She is having persistent unrelenting pain. Will reduce morphine and Zofran. Ultrasound technician is in the hospital and agrees to perform a right upper quadrant ultrasound to evaluate for cholecystitis.    Ultrasound shows no sign of cholecystitis or choledocholithiasis but there is a gallstone lodged at the gallbladder neck. Unable to get patient's pain under control after 3 rounds of IV narcotics. Discussed with Dr. Emelda FearFerguson with OB/GYN who [facility]. Patient and husband comfortable with plan.   Patient has leukocytes, bacteria and a few squamous cells in her urine. Culture pending. Will treat with Rocephin for UTI.    Layla MawKristen N Laverna Dossett, DO 03/23/14 [id number]  Layla MawKristen N Zakaria Sedor, DO 03/23/14 [id number]

## 2014-03-23 NOTE — Progress Notes (Signed)
UR completed 

## 2014-03-23 NOTE — ED Notes (Signed)
Pt vomiting prior to [facility] transport. EDP aware and give verbal order to admin 4mg  of zofran IV. Pt tolerated well.

## 2014-03-23 NOTE — H&P (Signed)
FACULTY PRACTICE ANTEPARTUM ADMISSION HISTORY AND PHYSICAL NOTE   History of Present Illness: April [name] is a 28 y.o. G1P0 at 3488w1d by L/5, pregnancy c/b A2GDM on glyburide presents transferred from outside hospital for pain control for biliary colic.  At approximately 3 months gestation experienced nausea, vomiting, and epigastric/RUQ pain lasting ~24 hours. Ultrasound at that time showed gallstone, instructed to avoid fatty foods and given oxycodone for pain.  No further symptoms until afternoon of 10/7 when began to experience mild epigastric discomfort. After eating pizza began to feel more epigastric/RUQ discomfort. Awoke at midnight with severe pain and GERD-like symptoms. Began vomiting NBNB. Pain 9/10 at outside ED, now s/p opioids pain 5/10, now 7/10. Mild nausea. No fevers, subjective chills. Mild diffuse low back pain as well. No dysuria or urinary frequency. No diarrhea, last BM at midnight.  At outside hospital [facility]([place]), mild LE, no nitrite, many bacteria in urine, given ceftriaxone for possible UTI.   Patient reports the fetal movement as active. Patient reports uterine contraction  activity as none. Patient reports  vaginal bleeding as none. Patient describes fluid per vagina as None.  Patient Active Problem List   Diagnosis Date Noted  . Cholelithiasis 03/23/2014  . Supervision of high-risk pregnancy 01/09/2014  . Diabetes mellitus affecting pregnancy in second trimester, antepartum 12/26/2013     Past Medical History  Diagnosis Date  . Gall stones     during 1st trimester of pregnancy     Past Surgical History  Procedure Laterality Date  . No past surgeries       OB History   Grav Para Term Preterm Abortions TAB SAB Ect Mult Living   1               History   Social History  . Marital Status: Married    Spouse Name: N/A    Number of Children: N/A  . Years of Education: N/A   Social History Main Topics  . Smoking status: Never Smoker    . Smokeless tobacco: None  . Alcohol Use: No  . Drug Use: No  . Sexual Activity: Yes   Other Topics Concern  . None   Social History Narrative  . None    Family History  Problem Relation Age of Onset  . Hypertension Mother     No Known Allergies  Facility-administered medications prior to admission  Medication Dose Route Frequency Provider Last Rate Last Dose  . Tdap (BOOSTRIX) injection 0.5 mL  0.5 mL Intramuscular Once Ethelda Chickaroline Roberts, MD       Prescriptions prior to admission  Medication Sig Dispense Refill  . aspirin EC 81 MG tablet Take 1 tablet (81 mg total) by mouth daily.  30 tablet  4  . calcium carbonate (TUMS EX) 750 MG chewable tablet Chew 1 tablet by mouth daily.      Marland Kitchen. glyBURIDE (DIABETA) 2.5 MG tablet Take 0.5 tablets (1.25 mg total) by mouth at bedtime.  30 tablet  5  . HYDROcodone-acetaminophen (NORCO/VICODIN) 5-325 MG per tablet Take 1 tablet by mouth every 6 (six) hours as needed for moderate pain.      . Prenatal Vit-Fe Fumarate-FA (PRENATAL MULTIVITAMIN) TABS tablet Take 1 tablet by mouth daily.         . ondansetron       I have reviewed the patient's current medications.  Review of Systems - Negative except as discussed in HPI  Vitals:  BP 126/74  Pulse 76  Temp(Src) 98.1 F (36.7  C) (Oral)  Resp 18  Ht 5\' 2"  (1.575 m)  Wt 104.781 kg (231 lb)  BMI 42.24 kg/m2  SpO2 95%  LMP 08/10/2013 Physical Examination:  General appearance - alert, well appearing, and in no distress and in mild to moderate distress Abdomen: gravid and fundal height  is approxiately consistent w/ dates. TTP RUQ Pelvic Exam:deferred Cervix: not assessed Extremities: extremities normal, atraumatic, no cyanosis or edema Fetal Monitoring: 150/moderate/+a/-d TOCO: quiet Labs:  Results for orders placed during the hospital encounter of 03/23/14 (from the past 24 hour(s))  CBC WITH DIFFERENTIAL   Collection Time    03/23/14  3:17 AM      Result Value Ref Range   WBC  11.1 (*) 4.0 - 10.5 K/uL   RBC 4.01  3.87 - 5.11 MIL/uL   Hemoglobin 13.4  12.0 - 15.0 g/dL   HCT 16.1  09.6 - 04.5 %   MCV 92.5  78.0 - 100.0 fL   MCH 33.4  26.0 - 34.0 pg   MCHC 36.1 (*) 30.0 - 36.0 g/dL   RDW 40.9  81.1 - 91.4 %   Platelets 189  150 - 400 K/uL   Neutrophils Relative % 75  43 - 77 %   Neutro Abs 8.4 (*) 1.7 - 7.7 K/uL   Lymphocytes Relative 17  12 - 46 %   Lymphs Abs 1.8  0.7 - 4.0 K/uL   Monocytes Relative 7  3 - 12 %   Monocytes Absolute 0.8  0.1 - 1.0 K/uL   Eosinophils Relative 1  0 - 5 %   Eosinophils Absolute 0.1  0.0 - 0.7 K/uL   Basophils Relative 0  0 - 1 %   Basophils Absolute 0.0  0.0 - 0.1 K/uL  COMPREHENSIVE METABOLIC PANEL   Collection Time    03/23/14  3:17 AM      Result Value Ref Range   Sodium 136 (*) 137 - 147 mEq/L   Potassium 3.8  3.7 - 5.3 mEq/L   Chloride 100  96 - 112 mEq/L   CO2 21  19 - 32 mEq/L   Glucose, Bld 130 (*) 70 - 99 mg/dL   BUN 7  6 - 23 mg/dL   Creatinine, Ser 7.82  0.50 - 1.10 mg/dL   Calcium 8.7  8.4 - 95.6 mg/dL   Total Protein 7.2  6.0 - 8.3 g/dL   Albumin 2.8 (*) 3.5 - 5.2 g/dL   AST 14  0 - 37 U/L   ALT 14  0 - 35 U/L   Alkaline Phosphatase 122 (*) 39 - 117 U/L   Total Bilirubin 0.2 (*) 0.3 - 1.2 mg/dL   GFR calc non Af Amer >90  >90 mL/min   GFR calc Af Amer >90  >90 mL/min   Anion gap 15  5 - 15  LIPASE, BLOOD   Collection Time    03/23/14  3:17 AM      Result Value Ref Range   Lipase 35  11 - 59 U/L  URINALYSIS, ROUTINE W REFLEX MICROSCOPIC   Collection Time    03/23/14  3:19 AM      Result Value Ref Range   Color, Urine YELLOW  YELLOW   APPearance CLEAR  CLEAR   Specific Gravity, Urine 1.025  1.005 - 1.030   pH 7.0  5.0 - 8.0   Glucose, UA NEGATIVE  NEGATIVE mg/dL   Hgb urine dipstick NEGATIVE  NEGATIVE   Bilirubin Urine NEGATIVE  NEGATIVE  Ketones, ur TRACE (*) NEGATIVE mg/dL   Protein, ur NEGATIVE  NEGATIVE mg/dL   Urobilinogen, UA 0.2  0.0 - 1.0 mg/dL   Nitrite NEGATIVE  NEGATIVE    Leukocytes, UA SMALL (*) NEGATIVE  URINE MICROSCOPIC-ADD ON   Collection Time    03/23/14  3:19 AM      Result Value Ref Range   Squamous Epithelial / LPF FEW (*) RARE   WBC, UA 3-6  <3 WBC/hpf   RBC / HPF 0-2  <3 RBC/hpf   Bacteria, UA MANY (*) RARE  CBG MONITORING, ED   Collection Time    03/23/14  3:26 AM      Result Value Ref Range   Glucose-Capillary 126 (*) 70 - 99 mg/dL    Imaging Studies: US Ob Follow Up  02/23/2014   OBSTETRICAL ULTRASOUND: This exam was performed within a [facility]. The OB US report was generated in the [facility] system, and faxed to the ordering physician.   This report is available in the [employer]. See the [facility] Obstetric US report via the Image Link.  US Abdomen Limited Ruq  03/23/2014   CLINICAL DATA:  Acute onset of generalized abdominal pain for 4 hours, with vomiting. Leukocytosis. Initial encounter.  EXAM: US ABDOMEN LIMITED - RIGHT UPPER QUADRANT  COMPARISON:  None.  FINDINGS: Gallbladder:  An apparent 2 cm stone is seen lodged at the neck of the gallbladder. The gallbladder is otherwise unremarkable in appearance. No gallbladder wall thickening or pericholecystic fluid is seen. No ultrasonographic Murphy's sign is elicited.  Common bile duct:  Diameter: 0.4 cm, within normal limits in caliber.  Liver:  No focal lesion identified. Diffusely increased parenchymal echogenicity, compatible with fatty infiltration.  IMPRESSION: 1. Cholelithiasis noted, with a 2 cm stone lodged at the neck of the gallbladder. Gallbladder otherwise unremarkable in appearance. No evidence for obstruction or cholecystitis. 2. Diffuse fatty infiltration within the liver.   Electronically Signed   By: Roanna Raider M.D.   On: 03/23/2014 04:48     Assessment and Plan: Patient Active Problem List   Diagnosis Date Noted  . Cholelithiasis 03/23/2014  . Supervision of high-risk pregnancy 01/09/2014  . Diabetes mellitus affecting pregnancy in second trimester,  antepartum 12/26/2013   Honesti Deschepper is a 28 y.o. G1P0 at [redacted]w[redacted]d by L/5, pregnancy c/b A2GDM on glyburide who presents with biliary colic.   # Biliary colic - 2 cm stone seen at galbladder neck, history of biliary colic this pregnancy. DDx includes cholecystitis (afebrile, no laboratory or ultrasonographic findings to support save for left shift, but does have RUQ tenderness on exam). Less likely appendicitis (in this gravid patient who does have a left shift, but no RLQ tenderness or fever), severe GERD (has history of and endorses epigastric pain and GERD-like symptoms last night), pyelonephritis (afebrile, no symptoms of UTI, urinalysis only small LE and no nitrites), pancreatitis (normal lipase). HELLP labs negative, not hypertensive to suggest preeclampsia. No LFT abnormalities to suggest acute fatty liver. No bleeding or uterine pain or fetal monitoring abnormalities to dsuggest abruption, no history of c/s to suggest increased risk for uterine rupture.  - admission to monitor for signs/symptoms cholecystitis - pain control with dilaudid 2 mg IV q2 hrs prn - zofran 4 mg q6 for nausea/vomiting - NPO, NS 125 ml/hr while NPO - UPC to complete preeclampsia laboratory w/u  # Positive leukocyte esterase in urine - urinalysis otherwise unremarkable, patient afebrile, no symptoms of UTI, though RUQ pain does extend  to right flank. - holding on further antibiotics, will f/u urine culture obtained prior to dose of ceftriaxone at outside hospital  # A2GDM - on glyburide 1.25 mg po qhs. Well-controlled per patient report. Random glucose here 126, 103.  - hold glyburide while NPO, continue to check f/s q shift   Blanchie Dessert, MD OB fellow Faculty Practice, [facility]

## 2014-03-23 NOTE — ED Notes (Signed)
Pt. Reports she is due December 2. Pt. Reports this is first child. Pt. Reports she has recently had problems with gall stones. Pt. C/o generalized abdominal pain with right upper quadrant tenderness. Pt. Denies vaginal discharge or gush of fluid.

## 2014-03-23 NOTE — Plan of Care (Signed)
Problem: Consults Goal: Birthing Suites Patient Information Press F2 to bring up selections list   Pt < [redacted] weeks EGA     

## 2014-03-23 NOTE — Progress Notes (Signed)
Monitoring d/c'd, ok per Dr. Elesa MassedWard. Reviewed tracing, advised Tresa EndoKelly RN that fetal tracing was reassuring, but not reactive at 0430 (125, good variability, no accels, no decels x2120minutes) , but that there was no readable tracing since 0430.

## 2014-03-23 NOTE — ED Notes (Signed)
They told me I have gallstones, I have been vomiting since midnight. 8 months pregnant and has gestational diabetes.

## 2014-03-23 NOTE — ED Notes (Signed)
OB rapid response notified pt. Placed on fetal monitor.

## 2014-03-23 NOTE — H&P (Signed)
Agree with PGY3 note.  Pt seen abd examined. Attestation of Attending Supervision of Resident: Evaluation and management procedures were performed [facility] under my supervision.  I have seen and examined the patient, reviewed the resident's note and chart, and I agree with the management and plan.  May increase diet gradually May heplock IV  Anibal Hendersonarolyn L Harraway-Smith, M.D. 03/23/2014 5:39 PM

## 2014-03-23 NOTE — ED Notes (Signed)
Dr. Elesa MassedWard stated that pt. can be removed from fetal monitoring at this time.

## 2014-03-23 NOTE — Progress Notes (Signed)
Requested fetal monitor adjustment. Suggested placing patient on side. FHT 130, moderated variability when tracing. No contractions

## 2014-03-23 NOTE — ED Notes (Signed)
US at bedside

## 2014-03-24 LAB — URINE CULTURE: Colony Count: 70000

## 2014-03-24 LAB — GLUCOSE, CAPILLARY
Glucose-Capillary: 111 mg/dL — ABNORMAL HIGH (ref 70–99)
Glucose-Capillary: 87 mg/dL (ref 70–99)

## 2014-03-24 MED ORDER — PROMETHAZINE HCL 25 MG PO TABS: 25.0000 mg | ORAL_TABLET | Freq: Four times a day (QID) | ORAL | Status: DC | PRN

## 2014-03-24 MED ORDER — OXYCODONE-ACETAMINOPHEN 5-325 MG PO TABS: 2.0000 | ORAL_TABLET | ORAL | Status: DC | PRN

## 2014-03-24 NOTE — Discharge Instructions (Signed)
Third Trimester of Pregnancy °The third trimester is from week 29 through week 42, months 7 through 9. The third trimester is a time when the fetus is growing rapidly. At the end of the ninth month, the fetus is about 20 inches in length and weighs 6-10 pounds.  °BODY CHANGES °Your body goes through many changes during pregnancy. The changes vary from woman to woman.  °· Your weight will continue to increase. You can expect to gain 25-35 pounds (11-16 kg) by the end of the pregnancy. °· You may begin to get stretch marks on your hips, abdomen, and breasts. °· You may urinate more often because the fetus is moving lower into your pelvis and pressing on your bladder. °· You may develop or continue to have heartburn as a result of your pregnancy. °· You may develop constipation because certain hormones are causing the muscles that push waste through your intestines to slow down. °· You may develop hemorrhoids or swollen, bulging veins (varicose veins). °· You may have pelvic pain because of the weight gain and pregnancy hormones relaxing your joints between the bones in your pelvis. Backaches may result from overexertion of the muscles supporting your posture. °· You may have changes in your hair. These can include thickening of your hair, rapid growth, and changes in texture. Some women also have hair loss during or after pregnancy, or hair that feels dry or thin. Your hair will most likely return to normal after your baby is born. °· Your breasts will continue to grow and be tender. A yellow discharge may leak from your breasts called colostrum. °· Your belly button may stick out. °· You may feel short of breath because of your expanding uterus. °· You may notice the fetus "dropping," or moving lower in your abdomen. °· You may have a bloody mucus discharge. This usually occurs a few days to a week before labor begins. °· Your cervix becomes thin and soft (effaced) near your due date. °WHAT TO EXPECT AT YOUR PRENATAL  EXAMS  °You will have prenatal exams every 2 weeks until week 36. Then, you will have weekly prenatal exams. During a routine prenatal visit: °· You will be weighed to make sure you and the fetus are growing normally. °· Your blood pressure is taken. °· Your abdomen will be measured to track your baby's growth. °· The fetal heartbeat will be listened to. °· Any test results from the previous visit will be discussed. °· You may have a cervical check near your due date to see if you have effaced. °At around 36 weeks, your caregiver will check your cervix. At the same time, your caregiver will also perform a test on the secretions of the vaginal tissue. This test is to determine if a type of bacteria, Group B streptococcus, is present. Your caregiver will explain this further. °Your caregiver may ask you: °· What your birth plan is. °· How you are feeling. °· If you are feeling the baby move. °· If you have had any abnormal symptoms, such as leaking fluid, bleeding, severe headaches, or abdominal cramping. °· If you have any questions. °Other tests or screenings that may be performed during your third trimester include: °· Blood tests that check for low iron levels (anemia). °· Fetal testing to check the health, activity level, and growth of the fetus. Testing is done if you have certain medical conditions or if there are problems during the pregnancy. °FALSE LABOR °You may feel small, irregular contractions that   eventually go away. These are called Braxton Hicks contractions, or false labor. Contractions may last for hours, days, or even weeks before true labor sets in. If contractions come at regular intervals, intensify, or become painful, it is best to be seen by your caregiver.  °SIGNS OF LABOR  °· Menstrual-like cramps. °· Contractions that are 5 minutes apart or less. °· Contractions that start on the top of the uterus and spread down to the lower abdomen and back. °· A sense of increased pelvic pressure or back  pain. °· A watery or bloody mucus discharge that comes from the vagina. °If you have any of these signs before the 37th week of pregnancy, call your caregiver right away. You need to go to the hospital to get checked immediately. °HOME CARE INSTRUCTIONS  °· Avoid all smoking, herbs, alcohol, and unprescribed drugs. These chemicals affect the formation and growth of the baby. °· Follow your caregiver's instructions regarding medicine use. There are medicines that are either safe or unsafe to take during pregnancy. °· Exercise only as directed by your caregiver. Experiencing uterine cramps is a good sign to stop exercising. °· Continue to eat regular, healthy meals. °· Wear a good support bra for breast tenderness. °· Do not use hot tubs, steam rooms, or saunas. °· Wear your seat belt at all times when driving. °· Avoid raw meat, uncooked cheese, cat litter boxes, and soil used by cats. These carry germs that can cause birth defects in the baby. °· Take your prenatal vitamins. °· Try taking a stool softener (if your caregiver approves) if you develop constipation. Eat more high-fiber foods, such as fresh vegetables or fruit and whole grains. Drink plenty of fluids to keep your urine clear or pale yellow. °· Take warm sitz baths to soothe any pain or discomfort caused by hemorrhoids. Use hemorrhoid cream if your caregiver approves. °· If you develop varicose veins, wear support hose. Elevate your feet for 15 minutes, 3-4 times a day. Limit salt in your diet. °· Avoid heavy lifting, wear low heal shoes, and practice good posture. °· Rest a lot with your legs elevated if you have leg cramps or low back pain. °· Visit your dentist if you have not gone during your pregnancy. Use a soft toothbrush to brush your teeth and be gentle when you floss. °· A sexual relationship may be continued unless your caregiver directs you otherwise. °· Do not travel far distances unless it is absolutely necessary and only with the approval  of your caregiver. °· Take prenatal classes to understand, practice, and ask questions about the labor and delivery. °· Make a trial run to the hospital. °· Pack your hospital bag. °· Prepare the baby's nursery. °· Continue to go to all your prenatal visits as directed by your caregiver. °SEEK MEDICAL CARE IF: °· You are unsure if you are in labor or if your water has broken. °· You have dizziness. °· You have mild pelvic cramps, pelvic pressure, or nagging pain in your abdominal area. °· You have persistent nausea, vomiting, or diarrhea. °· You have a bad smelling vaginal discharge. °· You have pain with urination. °SEEK IMMEDIATE MEDICAL CARE IF:  °· You have a fever. °· You are leaking fluid from your vagina. °· You have spotting or bleeding from your vagina. °· You have severe abdominal cramping or pain. °· You have rapid weight loss or gain. °· You have shortness of breath with chest pain. °· You notice sudden or extreme swelling   of your face, hands, ankles, feet, or legs.  You have not felt your baby move in over an hour.  You have severe headaches that do not go away with medicine.  You have vision changes. Document Released: 05/27/2001 Document Revised: 06/07/2013 Document Reviewed: 08/03/2012 [facility] Patient Information 2015 [place], [place]. This information is not intended to replace advice given to you by your health care provider. Make sure you discuss any questions you have with your health care provider. Cholecystitis Cholecystitis is an inflammation of your gallbladder. It is usually caused by a buildup of gallstones or sludge (cholelithiasis) in your gallbladder. The gallbladder stores a fluid that helps digest fats (bile). Cholecystitis is serious and needs treatment right away.  CAUSES   Gallstones. Gallstones can block the tube that leads to your gallbladder, causing bile to build up. As bile builds up, the gallbladder becomes inflamed.  Bile duct problems, such as blockage from  scarring or kinking.  Tumors. Tumors can stop bile from leaving your gallbladder correctly, causing bile to build up. As bile builds up, the gallbladder becomes inflamed. SYMPTOMS   Nausea.  Vomiting.  Abdominal pain, especially in the upper right area of your abdomen.  Abdominal tenderness or bloating.  Sweating.  Chills.  Fever.  Yellowing of the skin and the whites of the eyes (jaundice). DIAGNOSIS  Your caregiver may order blood tests to look for infection or gallbladder problems. Your caregiver may also order imaging tests, such as an ultrasound or computed tomography (CT) scan. Further tests may include a hepatobiliary iminodiacetic acid (HIDA) scan. This scan allows your caregiver to see your bile move from the liver to the gallbladder and to the small intestine. TREATMENT  A hospital stay is usually necessary to lessen the inflammation of your gallbladder. You may be required to not eat or drink (fast) for a certain amount of time. You may be given medicine to treat pain or an antibiotic medicine to treat an infection. Surgery may be needed to remove your gallbladder (cholecystectomy) once the inflammation has gone down. Surgery may be needed right away if you develop complications such as death of gallbladder tissue (gangrene) or a tear (perforation) of the gallbladder.  HOME CARE INSTRUCTIONS  Home care will depend on your treatment. In general:  If you were given antibiotics, take them as directed. Finish them even if you start to feel better.  Only take over-the-counter or prescription medicines for pain, discomfort, or fever as directed by your caregiver.  Follow a low-fat diet until you see your caregiver again.  Keep all follow-up visits as directed by your caregiver. SEEK IMMEDIATE MEDICAL CARE IF:   Your pain is increasing and not controlled by medicines.  Your pain moves to another part of your abdomen or to your back.  You have a fever.  You have nausea  and vomiting. MAKE SURE YOU:  Understand these instructions.  Will watch your condition.  Will get help right away if you are not doing well or get worse. Document Released: 06/02/2005 Document Revised: 08/25/2011 Document Reviewed: 04/18/2011 [facility] Patient Information 2015 [place], [place]. This information is not intended to replace advice given to you by your health care provider. Make sure you discuss any questions you have with your health care provider.

## 2014-03-24 NOTE — Discharge Summary (Signed)
Physician Discharge Summary  Patient ID: April [name] Call MRN: [id number] DOB/AGE: 28/09/1985 28 y.o.  Admit date: 03/23/2014 Discharge date: 03/24/2014  Admission Diagnoses:  Discharge Diagnoses:  Active Problems:   Cholelithiasis   Gall stone   Discharged Condition: good  Hospital Course: Pt was admitted for abdominal pain and suspected pain from cholelithiasis after eating a pizza 2 nights ago. She reports that her pain was improved after pain meds.  She has been able to tolerate a regular diet last night and feel ready to go home. She denies pregnancy issues.  +FM, No VB, No ctx or LOF       Significant Diagnostic Studies: labs: CBC, RUQ son  Treatments: IV hydration and analgesia: Percocet  Discharge Exam: Blood pressure 105/68, pulse 80, temperature 98.1 F (36.7 C), temperature source Oral, resp. rate 20, height 5\' 2"  (1.575 m), weight 231 lb (104.781 kg), last menstrual period 08/10/2013, SpO2 96.00%. General appearance: alert and no distress GI: soft,  +RUQ tenderness- imporved from admission  Skin: no edema; nontender  CBC    Component Value Date/Time   WBC 11.1* 03/23/2014 0317   RBC 4.01 03/23/2014 0317   HGB 13.4 03/23/2014 0317   HCT 37.1 03/23/2014 0317   PLT 189 03/23/2014 0317   MCV 92.5 03/23/2014 0317   MCH 33.4 03/23/2014 0317   MCHC 36.1* 03/23/2014 0317   RDW 13.1 03/23/2014 0317   LYMPHSABS 1.8 03/23/2014 0317   MONOABS 0.8 03/23/2014 0317   EOSABS 0.1 03/23/2014 0317   BASOSABS 0.0 03/23/2014 0317   '   Disposition: 01-Home or Self Care  Discharge Instructions   Discharge activity:  No Restrictions    Complete by:  As directed      Discharge diet:    Complete by:  As directed   ADA diet  Eat only food that do not exacerbate gallbladder- fatty heavy foods     No sexual activity restrictions    Complete by:  As directed      Notify physician for a general feeling that "something is not right"    Complete by:  As directed      Notify physician for  increase or change in vaginal discharge    Complete by:  As directed      Notify physician for intestinal cramps, with or without diarrhea, sometimes described as "gas pain"    Complete by:  As directed      Notify physician for leaking of fluid    Complete by:  As directed      Notify physician for low, dull backache, unrelieved by heat or Tylenol    Complete by:  As directed      Notify physician for menstrual like cramps    Complete by:  As directed      Notify physician for pelvic pressure    Complete by:  As directed      Notify physician for uterine contractions.  These may be painless and feel like the uterus is tightening or the baby is  "balling up"    Complete by:  As directed      Notify physician for vaginal bleeding    Complete by:  As directed      PRETERM LABOR:  Includes any of the follwing symptoms that occur between 20 - [redacted] weeks gestation.  If these symptoms are not stopped, preterm labor can result in preterm delivery, placing your baby at risk    Complete by:  As directed  Medication List         aspirin EC 81 MG tablet  Take 1 tablet (81 mg total) by mouth daily.     calcium carbonate 750 MG chewable tablet  Commonly known as:  TUMS EX  Chew 1 tablet by mouth daily.     glyBURIDE 2.5 MG tablet  Commonly known as:  DIABETA  Take 0.5 tablets (1.25 mg total) by mouth at bedtime.     HYDROcodone-acetaminophen 5-325 MG per tablet  Commonly known as:  NORCO/VICODIN  Take 1 tablet by mouth every 6 (six) hours as needed for moderate pain.     oxyCODONE-acetaminophen 5-325 MG per tablet  Commonly known as:  PERCOCET/ROXICET  Take 2 tablets by mouth every 4 (four) hours as needed (Pain greater than 4/10).     prenatal multivitamin Tabs tablet  Take 1 tablet by mouth daily.     promethazine 25 MG tablet  Commonly known as:  PHENERGAN  Take 1 tablet (25 mg total) by mouth every 6 (six) hours as needed for nausea or vomiting.            Follow-up Information   Follow up with [facility] In 3 days. (pt already has appointment)       Signed: [name], April [name] 03/24/2014, 7:25 AM

## 2014-03-27 ENCOUNTER — Ambulatory Visit (INDEPENDENT_AMBULATORY_CARE_PROVIDER_SITE_OTHER): Payer: Self-pay | Admitting: Obstetrics & Gynecology

## 2014-03-27 VITALS — BP 100/54 | HR 85 | Temp 97.5°F | Wt 230.1 lb

## 2014-03-27 DIAGNOSIS — O0993 Supervision of high risk pregnancy, unspecified, third trimester: Secondary | ICD-10-CM

## 2014-03-27 DIAGNOSIS — O24913 Unspecified diabetes mellitus in pregnancy, third trimester: Secondary | ICD-10-CM

## 2014-03-27 DIAGNOSIS — O24912 Unspecified diabetes mellitus in pregnancy, second trimester: Secondary | ICD-10-CM

## 2014-03-27 LAB — US OB FOLLOW UP

## 2014-03-27 LAB — POCT URINALYSIS DIP (DEVICE)
Bilirubin Urine: NEGATIVE
Glucose, UA: NEGATIVE mg/dL
Ketones, ur: NEGATIVE mg/dL
Leukocytes, UA: NEGATIVE
Nitrite: NEGATIVE
Protein, ur: NEGATIVE mg/dL
Specific Gravity, Urine: 1.01 (ref 1.005–1.030)
Urobilinogen, UA: 0.2 mg/dL (ref 0.0–1.0)
pH: 7 (ref 5.0–8.0)

## 2014-03-27 NOTE — Patient Instructions (Signed)
Return to clinic for any obstetric concerns or go to [facility] for evaluation  

## 2014-03-27 NOTE — Progress Notes (Signed)
Gallstone this past week/hospitalized overnight/complains of soreness still today. NST

## 2014-03-27 NOTE — Progress Notes (Signed)
Blood sugars reviewed and are within range. NST performed today was reviewed and was found to be reactive.  AFI normal at 15.3 cm.  Continue recommended antenatal testing and prenatal care. No other complaints or concerns.  Labor and fetal movement precautions reviewed.

## 2014-03-30 ENCOUNTER — Ambulatory Visit (INDEPENDENT_AMBULATORY_CARE_PROVIDER_SITE_OTHER): Payer: Self-pay | Admitting: General Practice

## 2014-03-30 ENCOUNTER — Other Ambulatory Visit: Payer: Self-pay

## 2014-03-30 VITALS — BP 109/59 | HR 101 | Temp 97.7°F | Wt 227.1 lb

## 2014-03-30 DIAGNOSIS — O24419 Gestational diabetes mellitus in pregnancy, unspecified control: Secondary | ICD-10-CM

## 2014-03-30 LAB — POCT URINALYSIS DIP (DEVICE)
Bilirubin Urine: NEGATIVE
Glucose, UA: NEGATIVE mg/dL
Hgb urine dipstick: NEGATIVE
Ketones, ur: 15 mg/dL — AB
Nitrite: NEGATIVE
Protein, ur: NEGATIVE mg/dL
Specific Gravity, Urine: 1.015 (ref 1.005–1.030)
Urobilinogen, UA: 0.2 mg/dL (ref 0.0–1.0)
pH: 7.5 (ref 5.0–8.0)

## 2014-03-30 NOTE — Progress Notes (Signed)
NST

## 2014-04-03 ENCOUNTER — Ambulatory Visit (INDEPENDENT_AMBULATORY_CARE_PROVIDER_SITE_OTHER): Payer: Self-pay | Admitting: Obstetrics & Gynecology

## 2014-04-03 ENCOUNTER — Other Ambulatory Visit: Payer: Self-pay | Admitting: Obstetrics & Gynecology

## 2014-04-03 ENCOUNTER — Ambulatory Visit (HOSPITAL_COMMUNITY)
Admission: RE | Admit: 2014-04-03 | Discharge: 2014-04-03 | Disposition: A | Discharge status: Home / Self Care | Payer: Self-pay | Source: Ambulatory Visit | Attending: Obstetrics & Gynecology | Admitting: Obstetrics & Gynecology

## 2014-04-03 VITALS — BP 116/64 | HR 66 | Temp 97.7°F | Wt 229.7 lb

## 2014-04-03 DIAGNOSIS — O26613 Liver and biliary tract disorders in pregnancy, third trimester: Secondary | ICD-10-CM

## 2014-04-03 DIAGNOSIS — O24912 Unspecified diabetes mellitus in pregnancy, second trimester: Secondary | ICD-10-CM

## 2014-04-03 DIAGNOSIS — O0993 Supervision of high risk pregnancy, unspecified, third trimester: Secondary | ICD-10-CM

## 2014-04-03 DIAGNOSIS — K8020 Calculus of gallbladder without cholecystitis without obstruction: Secondary | ICD-10-CM

## 2014-04-03 DIAGNOSIS — E119 Type 2 diabetes mellitus without complications: Secondary | ICD-10-CM | POA: Insufficient documentation

## 2014-04-03 DIAGNOSIS — Z3A33 33 weeks gestation of pregnancy: Secondary | ICD-10-CM | POA: Insufficient documentation

## 2014-04-03 DIAGNOSIS — O24313 Unspecified pre-existing diabetes mellitus in pregnancy, third trimester: Secondary | ICD-10-CM | POA: Insufficient documentation

## 2014-04-03 DIAGNOSIS — O99613 Diseases of the digestive system complicating pregnancy, third trimester: Secondary | ICD-10-CM | POA: Insufficient documentation

## 2014-04-03 LAB — POCT URINALYSIS DIP (DEVICE)
Bilirubin Urine: NEGATIVE
Glucose, UA: NEGATIVE mg/dL
Hgb urine dipstick: NEGATIVE
Ketones, ur: NEGATIVE mg/dL
Nitrite: NEGATIVE
Protein, ur: NEGATIVE mg/dL
Specific Gravity, Urine: 1.01 (ref 1.005–1.030)
Urobilinogen, UA: 0.2 mg/dL (ref 0.0–1.0)
pH: 7 (ref 5.0–8.0)

## 2014-04-03 NOTE — Patient Instructions (Signed)
Return to clinic for any obstetric concerns or go to [facility] for evaluation  

## 2014-04-03 NOTE — Progress Notes (Signed)
Blood sugars are within range, just one abnormal breakfast PP at 146. Continue diet adherence and Glyburide 1.25 mg po qhs. Occasional pain from gall stones, no fevers. Infection precautions reviewed. NST performed today was reviewed and was found to be reactive.  Continue recommended antenatal testing and prenatal care. AFI to be done at Ultrasound today. No other complaints or concerns.  Labor and fetal movement precautions reviewed.

## 2014-04-06 ENCOUNTER — Other Ambulatory Visit: Payer: Self-pay

## 2014-04-07 ENCOUNTER — Ambulatory Visit (INDEPENDENT_AMBULATORY_CARE_PROVIDER_SITE_OTHER): Payer: Self-pay | Admitting: *Deleted

## 2014-04-07 VITALS — BP 117/54 | HR 76

## 2014-04-07 DIAGNOSIS — O24913 Unspecified diabetes mellitus in pregnancy, third trimester: Secondary | ICD-10-CM

## 2014-04-08 NOTE — Progress Notes (Signed)
NST 04/07/14 reactive 

## 2014-04-10 ENCOUNTER — Ambulatory Visit (INDEPENDENT_AMBULATORY_CARE_PROVIDER_SITE_OTHER): Payer: Self-pay | Admitting: Family

## 2014-04-10 ENCOUNTER — Other Ambulatory Visit: Payer: Self-pay

## 2014-04-10 ENCOUNTER — Ambulatory Visit (HOSPITAL_COMMUNITY)
Admission: RE | Admit: 2014-04-10 | Discharge: 2014-04-10 | Disposition: A | Discharge status: Home / Self Care | Payer: Self-pay | Source: Ambulatory Visit | Attending: Family Medicine | Admitting: Family Medicine

## 2014-04-10 VITALS — BP 106/60 | HR 76 | Wt 235.7 lb

## 2014-04-10 DIAGNOSIS — O24912 Unspecified diabetes mellitus in pregnancy, second trimester: Secondary | ICD-10-CM

## 2014-04-10 DIAGNOSIS — K8020 Calculus of gallbladder without cholecystitis without obstruction: Secondary | ICD-10-CM | POA: Insufficient documentation

## 2014-04-10 DIAGNOSIS — E119 Type 2 diabetes mellitus without complications: Secondary | ICD-10-CM | POA: Insufficient documentation

## 2014-04-10 DIAGNOSIS — O24913 Unspecified diabetes mellitus in pregnancy, third trimester: Secondary | ICD-10-CM

## 2014-04-10 DIAGNOSIS — O26613 Liver and biliary tract disorders in pregnancy, third trimester: Secondary | ICD-10-CM

## 2014-04-10 DIAGNOSIS — Z3A34 34 weeks gestation of pregnancy: Secondary | ICD-10-CM | POA: Insufficient documentation

## 2014-04-10 DIAGNOSIS — O24113 Pre-existing diabetes mellitus, type 2, in pregnancy, third trimester: Secondary | ICD-10-CM | POA: Insufficient documentation

## 2014-04-10 DIAGNOSIS — O99613 Diseases of the digestive system complicating pregnancy, third trimester: Secondary | ICD-10-CM | POA: Insufficient documentation

## 2014-04-10 DIAGNOSIS — Z36 Encounter for antenatal screening of mother: Secondary | ICD-10-CM | POA: Insufficient documentation

## 2014-04-10 LAB — POCT URINALYSIS DIP (DEVICE)
Bilirubin Urine: NEGATIVE
Glucose, UA: NEGATIVE mg/dL
Hgb urine dipstick: NEGATIVE
Ketones, ur: NEGATIVE mg/dL
Nitrite: NEGATIVE
Protein, ur: NEGATIVE mg/dL
Specific Gravity, Urine: 1.015 (ref 1.005–1.030)
Urobilinogen, UA: 0.2 mg/dL (ref 0.0–1.0)
pH: 6.5 (ref 5.0–8.0)

## 2014-04-10 NOTE — Progress Notes (Signed)
FBS 83-91 (1/7) B 92-117 L 99-119 D 98-118.  Pt reports walking.  NST - Cat I FHR Tracing.  Reviewed growth US 88%ile, next growth ultrasound scheduled 11/16.  Reviewed PTL precautions.

## 2014-04-10 NOTE — Progress Notes (Signed)
US for growth done today.  Next US for growth scheduled 11/16.   Pt has observed increased swelling of legs, feet and hands.  Pt had 6 lb weight gain in 1 week.

## 2014-04-11 ENCOUNTER — Ambulatory Visit (HOSPITAL_COMMUNITY): Payer: Self-pay

## 2014-04-14 ENCOUNTER — Other Ambulatory Visit: Payer: Self-pay

## 2014-04-17 ENCOUNTER — Encounter (HOSPITAL_COMMUNITY): Payer: Self-pay | Admitting: Emergency Medicine

## 2014-04-17 ENCOUNTER — Ambulatory Visit (INDEPENDENT_AMBULATORY_CARE_PROVIDER_SITE_OTHER): Payer: Self-pay | Admitting: Obstetrics & Gynecology

## 2014-04-17 ENCOUNTER — Other Ambulatory Visit: Payer: Self-pay | Admitting: Obstetrics & Gynecology

## 2014-04-17 VITALS — BP 106/66 | HR 74 | Wt 234.9 lb

## 2014-04-17 DIAGNOSIS — O0993 Supervision of high risk pregnancy, unspecified, third trimester: Secondary | ICD-10-CM

## 2014-04-17 DIAGNOSIS — O24913 Unspecified diabetes mellitus in pregnancy, third trimester: Secondary | ICD-10-CM

## 2014-04-17 LAB — OB RESULTS CONSOLE GC/CHLAMYDIA
Chlamydia: NEGATIVE
Gonorrhea: NEGATIVE

## 2014-04-17 LAB — OB RESULTS CONSOLE GBS: GBS: NEGATIVE

## 2014-04-17 LAB — US OB FOLLOW UP

## 2014-04-17 NOTE — Progress Notes (Signed)
BG still in nl range as before. NST reactive today. GBS and CT GC done

## 2014-04-17 NOTE — Progress Notes (Signed)
Pt reports increased frequency of heartburn- mostly @ night- TUMS do not help much.  Growth US scheduled 11/16

## 2014-04-17 NOTE — Patient Instructions (Signed)
Third Trimester of Pregnancy The third trimester is from week 29 through week 42, months 7 through 9. The third trimester is a time when the fetus is growing rapidly. At the end of the ninth month, the fetus is about 20 inches in length and weighs 6-10 pounds.  BODY CHANGES Your body goes through many changes during pregnancy. The changes vary from woman to woman.   Your weight will continue to increase. You can expect to gain 25-35 pounds (11-16 kg) by the end of the pregnancy.  You may begin to get stretch marks on your hips, abdomen, and breasts.  You may urinate more often because the fetus is moving lower into your pelvis and pressing on your bladder.  You may develop or continue to have heartburn as a result of your pregnancy.  You may develop constipation because certain hormones are causing the muscles that push waste through your intestines to slow down.  You may develop hemorrhoids or swollen, bulging veins (varicose veins).  You may have pelvic pain because of the weight gain and pregnancy hormones relaxing your joints between the bones in your pelvis. Backaches may result from overexertion of the muscles supporting your posture.  You may have changes in your hair. These can include thickening of your hair, rapid growth, and changes in texture. Some women also have hair loss during or after pregnancy, or hair that feels dry or thin. Your hair will most likely return to normal after your baby is born.  Your breasts will continue to grow and be tender. A yellow discharge may leak from your breasts called colostrum.  Your belly button may stick out.  You may feel short of breath because of your expanding uterus.  You may notice the fetus "dropping," or moving lower in your abdomen.  You may have a bloody mucus discharge. This usually occurs a few days to a week before labor begins.  Your cervix becomes thin and soft (effaced) near your due date. WHAT TO EXPECT AT YOUR PRENATAL  EXAMS  You will have prenatal exams every 2 weeks until week 36. Then, you will have weekly prenatal exams. During a routine prenatal visit:  You will be weighed to make sure you and the fetus are growing normally.  Your blood pressure is taken.  Your abdomen will be measured to track your baby's growth.  The fetal heartbeat will be listened to.  Any test results from the previous visit will be discussed.  You may have a cervical check near your due date to see if you have effaced. At around 36 weeks, your caregiver will check your cervix. At the same time, your caregiver will also perform a test on the secretions of the vaginal tissue. This test is to determine if a type of bacteria, Group B streptococcus, is present. Your caregiver will explain this further. Your caregiver may ask you:  What your birth plan is.  How you are feeling.  If you are feeling the baby move.  If you have had any abnormal symptoms, such as leaking fluid, bleeding, severe headaches, or abdominal cramping.  If you have any questions. Other tests or screenings that may be performed during your third trimester include:  Blood tests that check for low iron levels (anemia).  Fetal testing to check the health, activity level, and growth of the fetus. Testing is done if you have certain medical conditions or if there are problems during the pregnancy. FALSE LABOR You may feel small, irregular contractions that   eventually go away. These are called Braxton Hicks contractions, or false labor. Contractions may last for hours, days, or even weeks before true labor sets in. If contractions come at regular intervals, intensify, or become painful, it is best to be seen by your caregiver.  SIGNS OF LABOR   Menstrual-like cramps.  Contractions that are 5 minutes apart or less.  Contractions that start on the top of the uterus and spread down to the lower abdomen and back.  A sense of increased pelvic pressure or back  pain.  A watery or bloody mucus discharge that comes from the vagina. If you have any of these signs before the 37th week of pregnancy, call your caregiver right away. You need to go to the hospital to get checked immediately. HOME CARE INSTRUCTIONS   Avoid all smoking, herbs, alcohol, and unprescribed drugs. These chemicals affect the formation and growth of the baby.  Follow your caregiver's instructions regarding medicine use. There are medicines that are either safe or unsafe to take during pregnancy.  Exercise only as directed by your caregiver. Experiencing uterine cramps is a good sign to stop exercising.  Continue to eat regular, healthy meals.  Wear a good support bra for breast tenderness.  Do not use hot tubs, steam rooms, or saunas.  Wear your seat belt at all times when driving.  Avoid raw meat, uncooked cheese, cat litter boxes, and soil used by cats. These carry germs that can cause birth defects in the baby.  Take your prenatal vitamins.  Try taking a stool softener (if your caregiver approves) if you develop constipation. Eat more high-fiber foods, such as fresh vegetables or fruit and whole grains. Drink plenty of fluids to keep your urine clear or pale yellow.  Take warm sitz baths to soothe any pain or discomfort caused by hemorrhoids. Use hemorrhoid cream if your caregiver approves.  If you develop varicose veins, wear support hose. Elevate your feet for 15 minutes, 3-4 times a day. Limit salt in your diet.  Avoid heavy lifting, wear low heal shoes, and practice good posture.  Rest a lot with your legs elevated if you have leg cramps or low back pain.  Visit your dentist if you have not gone during your pregnancy. Use a soft toothbrush to brush your teeth and be gentle when you floss.  A sexual relationship may be continued unless your caregiver directs you otherwise.  Do not travel far distances unless it is absolutely necessary and only with the approval  of your caregiver.  Take prenatal classes to understand, practice, and ask questions about the labor and delivery.  Make a trial run to the hospital.  Pack your hospital bag.  Prepare the baby's nursery.  Continue to go to all your prenatal visits as directed by your caregiver. SEEK MEDICAL CARE IF:  You are unsure if you are in labor or if your water has broken.  You have dizziness.  You have mild pelvic cramps, pelvic pressure, or nagging pain in your abdominal area.  You have persistent nausea, vomiting, or diarrhea.  You have a bad smelling vaginal discharge.  You have pain with urination. SEEK IMMEDIATE MEDICAL CARE IF:   You have a fever.  You are leaking fluid from your vagina.  You have spotting or bleeding from your vagina.  You have severe abdominal cramping or pain.  You have rapid weight loss or gain.  You have shortness of breath with chest pain.  You notice sudden or extreme swelling   of your face, hands, ankles, feet, or legs.  You have not felt your baby move in over an hour.  You have severe headaches that do not go away with medicine.  You have vision changes. Document Released: 05/27/2001 Document Revised: 06/07/2013 Document Reviewed: 08/03/2012 [employer] Patient Information 2015 [employer]. This information is not intended to replace advice given to you by your health care provider. Make sure you discuss any questions you have with your health care provider.  

## 2014-04-18 LAB — GC/CHLAMYDIA PROBE AMP
CT Probe RNA: NEGATIVE
GC Probe RNA: NEGATIVE

## 2014-04-19 LAB — CULTURE, BETA STREP (GROUP B ONLY)

## 2014-04-20 ENCOUNTER — Encounter: Payer: Self-pay | Admitting: Obstetrics and Gynecology

## 2014-04-20 ENCOUNTER — Other Ambulatory Visit: Payer: Self-pay

## 2014-04-24 ENCOUNTER — Ambulatory Visit (INDEPENDENT_AMBULATORY_CARE_PROVIDER_SITE_OTHER): Payer: Self-pay | Admitting: Obstetrics & Gynecology

## 2014-04-24 VITALS — BP 102/47 | HR 73 | Wt 240.3 lb

## 2014-04-24 DIAGNOSIS — O0993 Supervision of high risk pregnancy, unspecified, third trimester: Secondary | ICD-10-CM

## 2014-04-24 DIAGNOSIS — O24913 Unspecified diabetes mellitus in pregnancy, third trimester: Secondary | ICD-10-CM

## 2014-04-24 LAB — US OB FOLLOW UP

## 2014-04-24 NOTE — Progress Notes (Signed)
US for growth scheduled 11/23.  Pt reports increased swelling of ankles and fingers and occasional menstrual-like cramping.

## 2014-04-24 NOTE — Progress Notes (Signed)
Blood sugars are within range, continue Glyburide 1.25 mg po qhs.   Reassured about symptoms.  Growth scan re-scheduled on 05/09/15.  Negative GBS, GC/Chlam. NST performed today was reviewed and was found to be reactive.  AFI normal at 14.3 cm.  Continue recommended antenatal testing and prenatal care. No other complaints or concerns.  Labor and fetal movement precautions reviewed.

## 2014-04-24 NOTE — Patient Instructions (Signed)
Return to clinic for any obstetric concerns or go to [facility] for evaluation  

## 2014-04-28 ENCOUNTER — Ambulatory Visit (INDEPENDENT_AMBULATORY_CARE_PROVIDER_SITE_OTHER): Payer: Self-pay | Admitting: *Deleted

## 2014-04-28 VITALS — BP 118/70 | HR 69

## 2014-04-28 DIAGNOSIS — O24913 Unspecified diabetes mellitus in pregnancy, third trimester: Secondary | ICD-10-CM

## 2014-05-01 ENCOUNTER — Ambulatory Visit (HOSPITAL_COMMUNITY): Payer: MEDICAID

## 2014-05-01 ENCOUNTER — Ambulatory Visit (INDEPENDENT_AMBULATORY_CARE_PROVIDER_SITE_OTHER): Payer: Self-pay | Admitting: Obstetrics & Gynecology

## 2014-05-01 VITALS — BP 110/70 | HR 79 | Temp 97.8°F | Wt 240.5 lb

## 2014-05-01 DIAGNOSIS — O24913 Unspecified diabetes mellitus in pregnancy, third trimester: Secondary | ICD-10-CM

## 2014-05-01 LAB — POCT URINALYSIS DIP (DEVICE)
Bilirubin Urine: NEGATIVE
Glucose, UA: NEGATIVE mg/dL
Ketones, ur: NEGATIVE mg/dL
Nitrite: NEGATIVE
Protein, ur: NEGATIVE mg/dL
Specific Gravity, Urine: 1.025 (ref 1.005–1.030)
Urobilinogen, UA: 0.2 mg/dL (ref 0.0–1.0)
pH: 6 (ref 5.0–8.0)

## 2014-05-01 LAB — US OB FOLLOW UP

## 2014-05-01 NOTE — Progress Notes (Signed)
Blood sugars are within range, continue Glyburide 1.25 mg po qhs.  Reassured about symptoms. Growth scan on 05/09/15, will follow up results.  NST performed today was reviewed and was found to be reactive. AFI normal. Continue recommended antenatal testing and prenatal care.  IOL scheduled 05/12/14 1930. No other complaints or concerns. Labor and fetal movement precautions reviewed.

## 2014-05-01 NOTE — Patient Instructions (Signed)
Return to clinic for any obstetric concerns or go to [facility] for evaluation  

## 2014-05-01 NOTE — Progress Notes (Signed)
Patient reports pelvic pain/pressure and lower back pain

## 2014-05-03 ENCOUNTER — Encounter: Payer: Self-pay | Admitting: General Practice

## 2014-05-04 ENCOUNTER — Telehealth (HOSPITAL_COMMUNITY): Payer: Self-pay | Admitting: *Deleted

## 2014-05-04 ENCOUNTER — Encounter (HOSPITAL_COMMUNITY): Payer: Self-pay | Admitting: *Deleted

## 2014-05-04 NOTE — Telephone Encounter (Signed)
Preadmission screen  

## 2014-05-05 ENCOUNTER — Ambulatory Visit (INDEPENDENT_AMBULATORY_CARE_PROVIDER_SITE_OTHER): Payer: Self-pay | Admitting: *Deleted

## 2014-05-05 VITALS — BP 118/68 | HR 76

## 2014-05-05 DIAGNOSIS — O24913 Unspecified diabetes mellitus in pregnancy, third trimester: Secondary | ICD-10-CM

## 2014-05-05 NOTE — Progress Notes (Signed)
Category 1 tracing with baseline in 140s.  Moderate variability, multiple accelerations, no decelerations.  

## 2014-05-08 ENCOUNTER — Encounter: Payer: Self-pay | Admitting: Obstetrics and Gynecology

## 2014-05-08 ENCOUNTER — Ambulatory Visit (HOSPITAL_COMMUNITY)
Admission: RE | Admit: 2014-05-08 | Discharge: 2014-05-08 | Disposition: A | Discharge status: Home / Self Care | Payer: Self-pay | Source: Ambulatory Visit | Attending: Family | Admitting: Family

## 2014-05-08 ENCOUNTER — Ambulatory Visit (INDEPENDENT_AMBULATORY_CARE_PROVIDER_SITE_OTHER): Payer: Self-pay | Admitting: Obstetrics and Gynecology

## 2014-05-08 VITALS — BP 123/77 | HR 72 | Temp 98.0°F | Wt 243.9 lb

## 2014-05-08 DIAGNOSIS — O0993 Supervision of high risk pregnancy, unspecified, third trimester: Secondary | ICD-10-CM

## 2014-05-08 DIAGNOSIS — O24912 Unspecified diabetes mellitus in pregnancy, second trimester: Secondary | ICD-10-CM

## 2014-05-08 DIAGNOSIS — Z3A38 38 weeks gestation of pregnancy: Secondary | ICD-10-CM | POA: Insufficient documentation

## 2014-05-08 DIAGNOSIS — O24913 Unspecified diabetes mellitus in pregnancy, third trimester: Secondary | ICD-10-CM

## 2014-05-08 DIAGNOSIS — O26613 Liver and biliary tract disorders in pregnancy, third trimester: Secondary | ICD-10-CM

## 2014-05-08 DIAGNOSIS — O24414 Gestational diabetes mellitus in pregnancy, insulin controlled: Secondary | ICD-10-CM | POA: Insufficient documentation

## 2014-05-08 DIAGNOSIS — O3663X0 Maternal care for excessive fetal growth, third trimester, not applicable or unspecified: Secondary | ICD-10-CM | POA: Insufficient documentation

## 2014-05-08 DIAGNOSIS — K8020 Calculus of gallbladder without cholecystitis without obstruction: Secondary | ICD-10-CM

## 2014-05-08 DIAGNOSIS — O99213 Obesity complicating pregnancy, third trimester: Secondary | ICD-10-CM | POA: Insufficient documentation

## 2014-05-08 LAB — POCT URINALYSIS DIP (DEVICE)
Bilirubin Urine: NEGATIVE
Glucose, UA: NEGATIVE mg/dL
Hgb urine dipstick: NEGATIVE
Ketones, ur: NEGATIVE mg/dL
Nitrite: NEGATIVE
Protein, ur: NEGATIVE mg/dL
Specific Gravity, Urine: <=1.005 (ref 1.005–1.030)
Urobilinogen, UA: 0.2 mg/dL (ref 0.0–1.0)
pH: 5.5 (ref 5.0–8.0)

## 2014-05-08 NOTE — Progress Notes (Signed)
CBGs within range NST reviewed and reactive 11/23 US shows a 4100 gm fetus. Risks of shoulder dystocia and its complication reviewed and explained with the patient. Informed patient that there is a low threshold for c-section in this case but she can still try for vaginal birth. All questions were answered FM/labor precautions reviewed. IOL scheduled for 11/27

## 2014-05-08 NOTE — Progress Notes (Signed)
OBF/NST 

## 2014-05-08 NOTE — Progress Notes (Signed)
11/13 NST reviewed and reactive 

## 2014-05-08 NOTE — Progress Notes (Signed)
US for growth done today.  IOL scheduled 11/27

## 2014-05-12 ENCOUNTER — Inpatient Hospital Stay (HOSPITAL_COMMUNITY)
Admission: RE | Admit: 2014-05-12 | Discharge: 2014-05-17 | DRG: 766 | Disposition: A | Discharge status: Home / Self Care | Payer: Medicaid - Out of State | Source: Ambulatory Visit | Attending: Obstetrics & Gynecology | Admitting: Obstetrics & Gynecology

## 2014-05-12 VITALS — BP 130/73 | HR 74 | Temp 98.6°F | Resp 18 | Ht 62.0 in | Wt 245.0 lb

## 2014-05-12 DIAGNOSIS — O3663X0 Maternal care for excessive fetal growth, third trimester, not applicable or unspecified: Secondary | ICD-10-CM | POA: Diagnosis present

## 2014-05-12 DIAGNOSIS — O24419 Gestational diabetes mellitus in pregnancy, unspecified control: Secondary | ICD-10-CM | POA: Diagnosis present

## 2014-05-12 DIAGNOSIS — Z3403 Encounter for supervision of normal first pregnancy, third trimester: Secondary | ICD-10-CM | POA: Diagnosis present

## 2014-05-12 DIAGNOSIS — Z3A39 39 weeks gestation of pregnancy: Secondary | ICD-10-CM | POA: Diagnosis present

## 2014-05-12 DIAGNOSIS — O24429 Gestational diabetes mellitus in childbirth, unspecified control: Secondary | ICD-10-CM | POA: Diagnosis present

## 2014-05-12 DIAGNOSIS — O26613 Liver and biliary tract disorders in pregnancy, third trimester: Secondary | ICD-10-CM

## 2014-05-12 DIAGNOSIS — O24912 Unspecified diabetes mellitus in pregnancy, second trimester: Secondary | ICD-10-CM

## 2014-05-12 DIAGNOSIS — Z98891 History of uterine scar from previous surgery: Secondary | ICD-10-CM

## 2014-05-12 DIAGNOSIS — O0993 Supervision of high risk pregnancy, unspecified, third trimester: Secondary | ICD-10-CM

## 2014-05-12 DIAGNOSIS — K8020 Calculus of gallbladder without cholecystitis without obstruction: Secondary | ICD-10-CM

## 2014-05-12 HISTORY — DX: Type 2 diabetes mellitus without complications: E11.9

## 2014-05-12 LAB — CBC
HCT: 35.9 % — ABNORMAL LOW (ref 36.0–46.0)
Hemoglobin: 12.9 g/dL (ref 12.0–15.0)
MCH: 33.7 pg (ref 26.0–34.0)
MCHC: 35.9 g/dL (ref 30.0–36.0)
MCV: 93.7 fL (ref 78.0–100.0)
Platelets: 162 10*3/uL (ref 150–400)
RBC: 3.83 MIL/uL — ABNORMAL LOW (ref 3.87–5.11)
RDW: 13.4 % (ref 11.5–15.5)
WBC: 9.1 10*3/uL (ref 4.0–10.5)

## 2014-05-12 LAB — TYPE AND SCREEN
ABO/RH(D): O POS
Antibody Screen: NEGATIVE

## 2014-05-12 LAB — GLUCOSE, CAPILLARY: Glucose-Capillary: 99 mg/dL (ref 70–99)

## 2014-05-12 LAB — ABO/RH: ABO/RH(D): O POS

## 2014-05-12 MED ORDER — MISOPROSTOL 25 MCG QUARTER TABLET
25.0000 ug | ORAL_TABLET | ORAL | Status: DC | PRN
  Administered 2014-05-12: 25 ug via VAGINAL
  Filled 2014-05-12: qty 0.25

## 2014-05-12 MED ORDER — ACETAMINOPHEN 325 MG PO TABS: 650.0000 mg | ORAL_TABLET | ORAL | Status: DC | PRN

## 2014-05-12 MED ORDER — OXYTOCIN BOLUS FROM INFUSION: 500.0000 mL | INTRAVENOUS | Status: DC

## 2014-05-12 MED ORDER — LACTATED RINGERS IV SOLN
500.0000 mL | INTRAVENOUS | Status: DC | PRN
  Administered 2014-05-13: 500 mL via INTRAVENOUS

## 2014-05-12 MED ORDER — ONDANSETRON HCL 4 MG/2ML IJ SOLN
4.0000 mg | Freq: Four times a day (QID) | INTRAMUSCULAR | Status: DC | PRN
  Administered 2014-05-13 (×2): 4 mg via INTRAVENOUS
  Filled 2014-05-12 (×2): qty 2

## 2014-05-12 MED ORDER — OXYCODONE-ACETAMINOPHEN 5-325 MG PO TABS
2.0000 | ORAL_TABLET | ORAL | Status: DC | PRN
Start: 2014-05-12 — End: 2014-05-14

## 2014-05-12 MED ORDER — LIDOCAINE HCL (PF) 1 % IJ SOLN: 30.0000 mL | INTRAMUSCULAR | Status: DC | PRN

## 2014-05-12 MED ORDER — OXYTOCIN 40 UNITS IN LACTATED RINGERS INFUSION - SIMPLE MED
62.5000 mL/h | INTRAVENOUS | Status: DC
  Filled 2014-05-12: qty 1000

## 2014-05-12 MED ORDER — CITRIC ACID-SODIUM CITRATE 334-500 MG/5ML PO SOLN
30.0000 mL | ORAL | Status: DC | PRN
  Administered 2014-05-14: 30 mL via ORAL
  Filled 2014-05-12: qty 15

## 2014-05-12 MED ORDER — OXYCODONE-ACETAMINOPHEN 5-325 MG PO TABS: 1.0000 | ORAL_TABLET | ORAL | Status: DC | PRN

## 2014-05-12 MED ORDER — TERBUTALINE SULFATE 1 MG/ML IJ SOLN: 0.2500 mg | Freq: Once | INTRAMUSCULAR | Status: AC | PRN

## 2014-05-12 MED ORDER — LACTATED RINGERS IV SOLN
INTRAVENOUS | Status: DC
  Administered 2014-05-13 (×3): via INTRAVENOUS

## 2014-05-12 MED ORDER — ZOLPIDEM TARTRATE 5 MG PO TABS
5.0000 mg | ORAL_TABLET | Freq: Every evening | ORAL | Status: DC | PRN
  Administered 2014-05-12: 5 mg via ORAL
  Filled 2014-05-12: qty 1

## 2014-05-13 ENCOUNTER — Inpatient Hospital Stay (HOSPITAL_COMMUNITY): Payer: Medicaid - Out of State | Admitting: Anesthesiology

## 2014-05-13 ENCOUNTER — Encounter (HOSPITAL_COMMUNITY): Payer: Self-pay

## 2014-05-13 LAB — GLUCOSE, CAPILLARY
Glucose-Capillary: 139 mg/dL — ABNORMAL HIGH (ref 70–99)
Glucose-Capillary: 82 mg/dL (ref 70–99)
Glucose-Capillary: 97 mg/dL (ref 70–99)
Glucose-Capillary: 90 mg/dL (ref 70–99)

## 2014-05-13 LAB — HIV ANTIBODY (ROUTINE TESTING W REFLEX): HIV 1&2 Ab, 4th Generation: NONREACTIVE

## 2014-05-13 LAB — RPR

## 2014-05-13 MED ORDER — EPHEDRINE 5 MG/ML INJ: 10.0000 mg | INTRAVENOUS | Status: DC | PRN

## 2014-05-13 MED ORDER — PHENYLEPHRINE 40 MCG/ML (10ML) SYRINGE FOR IV PUSH (FOR BLOOD PRESSURE SUPPORT): 80.0000 ug | PREFILLED_SYRINGE | INTRAVENOUS | Status: DC | PRN

## 2014-05-13 MED ORDER — LACTATED RINGERS IV SOLN
500.0000 mL | Freq: Once | INTRAVENOUS | Status: AC
  Administered 2014-05-13: 500 mL via INTRAVENOUS

## 2014-05-13 MED ORDER — FENTANYL 2.5 MCG/ML BUPIVACAINE 1/10 % EPIDURAL INFUSION (WH - ANES): 14.0000 mL/h | INTRAMUSCULAR | Status: DC | PRN

## 2014-05-13 MED ORDER — LIDOCAINE HCL (PF) 1 % IJ SOLN
INTRAMUSCULAR | Status: DC | PRN
  Administered 2014-05-13: 6 mL
  Administered 2014-05-13: 4 mL

## 2014-05-13 MED ORDER — TERBUTALINE SULFATE 1 MG/ML IJ SOLN: 0.2500 mg | Freq: Once | INTRAMUSCULAR | Status: AC | PRN

## 2014-05-13 MED ORDER — PHENYLEPHRINE 40 MCG/ML (10ML) SYRINGE FOR IV PUSH (FOR BLOOD PRESSURE SUPPORT)
PREFILLED_SYRINGE | INTRAVENOUS | Status: AC
  Filled 2014-05-13: qty 10

## 2014-05-13 MED ORDER — OXYTOCIN 40 UNITS IN LACTATED RINGERS INFUSION - SIMPLE MED
1.0000 m[IU]/min | INTRAVENOUS | Status: DC
  Administered 2014-05-13: 2 m[IU]/min via INTRAVENOUS

## 2014-05-13 MED ORDER — FENTANYL 2.5 MCG/ML BUPIVACAINE 1/10 % EPIDURAL INFUSION (WH - ANES)
14.0000 mL/h | INTRAMUSCULAR | Status: DC | PRN
  Administered 2014-05-13 – 2014-05-14 (×2): 14 mL/h via EPIDURAL
  Filled 2014-05-13: qty 125

## 2014-05-13 MED ORDER — PHENYLEPHRINE 40 MCG/ML (10ML) SYRINGE FOR IV PUSH (FOR BLOOD PRESSURE SUPPORT)
80.0000 ug | PREFILLED_SYRINGE | INTRAVENOUS | Status: DC | PRN
  Filled 2014-05-13: qty 10

## 2014-05-13 MED ORDER — DIPHENHYDRAMINE HCL 50 MG/ML IJ SOLN: 12.5000 mg | INTRAMUSCULAR | Status: DC | PRN

## 2014-05-13 MED ORDER — FENTANYL 2.5 MCG/ML BUPIVACAINE 1/10 % EPIDURAL INFUSION (WH - ANES)
INTRAMUSCULAR | Status: AC
  Filled 2014-05-13: qty 125

## 2014-05-13 MED ORDER — FENTANYL CITRATE 0.05 MG/ML IJ SOLN
100.0000 ug | INTRAMUSCULAR | Status: DC | PRN
Start: 2014-05-13 — End: 2014-05-14
  Administered 2014-05-13 (×3): 100 ug via INTRAVENOUS
  Filled 2014-05-13 (×3): qty 2

## 2014-05-13 NOTE — Plan of Care (Signed)
Problem: Phase I Progression Outcomes Goal: Pain controlled with appropriate interventions Outcome: Completed/Met Date Met:  05/13/14 Goal: IV Pain medications as ordered Outcome: Completed/Met Date Met:  05/13/14 Goal: Appropriate patient level of comfort Outcome: Completed/Met Date Met:  05/13/14

## 2014-05-13 NOTE — Progress Notes (Signed)
Denaja Francina AmesClemente is a 28 y.o. G1P0 at 3472w3d   Subjective: Getting comfortable with epidural  Objective: BP 123/77 mmHg  Pulse 78  Temp(Src) 98.2 F (36.8 C) (Oral)  Resp 20  Ht 5\' 2"  (1.575 m)  Wt 111.131 kg (245 lb)  BMI 44.80 kg/m2  LMP 08/10/2013   Total I/O In: -  Out: 450 [Emesis/NG output:450]  FHT:  FHR: 130s bpm, variability: moderate,  accelerations:  Present,  decelerations:  Absent UC:   regular, every 2-3 minutes with Pit @ 507mu/min SVE:   Dilation: 7 Effacement (%): 80, 90 Station: -2 Exam by:: Lorretta Harp. Brown RNC  Labs: Lab Results  Component Value Date   WBC 9.1 05/12/2014   HGB 12.9 05/12/2014   HCT 35.9* 05/12/2014   MCV 93.7 05/12/2014   PLT 162 05/12/2014    Assessment / Plan: Active labor LGA  Continue to keep ctx adequate  Endrit Gittins CNM 05/13/2014, 5:06 PM

## 2014-05-13 NOTE — Progress Notes (Signed)
April [name] is a 28 y.o. G1P0 at 236w3d   Subjective: Just received Fentanyl- doing well with it; SROM recently; unable to trace ctx when pt on side due to habitus  Objective: BP 122/65 mmHg  Pulse 67  Temp(Src) 98.3 F (36.8 C) (Oral)  Resp 18  Ht 5\' 2"  (1.575 m)  Wt 111.131 kg (245 lb)  BMI 44.80 kg/m2  LMP 08/10/2013      FHT:  FHR: 130s bpm, variability: moderate,  accelerations:  Present,  decelerations:  Absent- some early variables UC:   regular, every 2 minutes with Pitocoin @ 305mu/min SVE:   Dilation: 5 Effacement (%): 80 Station: -2 Exam by:: Pincus BadderK. Shaw CNM- IUPC inserted without difficulty  Labs: Lab Results  Component Value Date   WBC 9.1 05/12/2014   HGB 12.9 05/12/2014   HCT 35.9* 05/12/2014   MCV 93.7 05/12/2014   PLT 162 05/12/2014    Assessment / Plan: Early labor LGA  Assess MVUs and adjust Pitocin accordingly to try to achieve adequate labor  Cam HaiSHAW, KIMBERLY CNM 05/13/2014, 1:57 PM

## 2014-05-13 NOTE — Anesthesia Procedure Notes (Signed)
Epidural Patient location during procedure: OB  Preanesthetic Checklist Completed: patient identified, site marked, surgical consent, pre-op evaluation, timeout performed, IV checked, risks and benefits discussed and monitors and equipment checked  Epidural Patient position: sitting Prep: site prepped and draped and DuraPrep Patient monitoring: continuous pulse ox and blood pressure Approach: midline Location: L3-L4 Injection technique: LOR air  Needle:  Needle type: Tuohy  Needle gauge: 17 G Needle length: 9 cm and 9 Needle insertion depth: 9 cm Catheter type: closed end flexible Catheter size: 19 Gauge Catheter at skin depth: 18 cm Test dose: negative  Assessment Events: blood not aspirated, injection not painful, no injection resistance, negative IV test and no paresthesia  Additional Notes Dosing of Epidural:  1st dose, through catheter ............................................Marland Kitchen.  Xylocaine 40 mg  2nd dose, through catheter, after waiting 3 minutes........Marland Kitchen.Xylocaine 60 mg    ( 1% Xylo charted as a single dose in Epic Meds for ease of charting; actual dosing was fractionated as above, for saftey's sake)  As each dose occurred, patient was free of IV sx; and patient exhibited no evidence of SA injection.  Patient is more comfortable after epidural dosed. Please see RN's note for documentation of vital signs,and FHR which are stable.  Patient reminded not to try to ambulate with numb legs, and that an RN must be present when she attempts to get up.

## 2014-05-13 NOTE — Plan of Care (Signed)
Problem: Phase I Progression Outcomes Goal: Assess per MD/Nurse,Routine-VS,FHR,UC,Head to Toe assess Outcome: Completed/Met Date Met:  05/13/14 Goal: Obtain and review prenatal records Outcome: Completed/Met Date Met:  05/13/14 Goal: OOB as tolerated unless otherwise ordered Outcome: Completed/Met Date Met:  05/13/14 Goal: Tolerating diet Outcome: Completed/Met Date Met:  05/13/14 Goal: Medications/IV Fluids N/A Outcome: Completed/Met Date Met:  05/13/14 Goal: Induction meds as ordered Outcome: Completed/Met Date Met:  05/13/14 Goal: Pitocin as ordered Outcome: Completed/Met Date Met:  05/13/14 Goal: Medical plan of care initiated within 2 hrs of admission Outcome: Progressing Pt gestational diabetic- monitoring CBG"s every 4 hours

## 2014-05-13 NOTE — Progress Notes (Signed)
April [name] is a 28 y.o. G1P0 at 2534w3d   Subjective: Comfortable  Objective: BP 128/67 mmHg  Pulse 65  Temp(Src) 98.6 F (37 C) (Oral)  Resp 18  Ht 5\' 2"  (1.575 m)  Wt 111.131 kg (245 lb)  BMI 44.80 kg/m2  LMP 08/10/2013 I/O last 3 completed shifts: In: -  Out: 450 [Emesis/NG output:450]    FHT:  FHR: 150s bpm, variability: moderate,  accelerations:  Present,  decelerations:  Absent- occ early variables UC:   regular, every 2-3 minutes SVE:   Dilation: 8 Effacement (%): 90 Station: -2 Exam by:: Kshaw, CNM- IUPC replaced  Labs: Lab Results  Component Value Date   WBC 9.1 05/12/2014   HGB 12.9 05/12/2014   HCT 35.9* 05/12/2014   MCV 93.7 05/12/2014   PLT 162 05/12/2014    Assessment / Plan: Protracted active phase LGA  Replaced IUPC seems to be tracing ctx more accurately; will need to increase Pitocin to achieve adequate MVUs to determine if labor is progressing as it should. Dr Macon LargeAnyanwu updated. Place cx recheck after 2 hrs adequate MVUs.  April HaiSHAW, April [name] CNM 05/13/2014, 10:48 PM

## 2014-05-13 NOTE — Plan of Care (Signed)
Problem: Phase I Progression Outcomes Goal: FHR checked 5 minutes after meds (ROM) Rupture of Membranes Outcome: Completed/Met Date Met:  05/13/14

## 2014-05-13 NOTE — Anesthesia Preprocedure Evaluation (Signed)
Anesthesia Evaluation  Patient identified by MRN, date of birth, ID band Patient awake    Reviewed: Allergy & Precautions, H&P , Patient's Chart, lab work & pertinent test results  Airway Mallampati: II  TM Distance: >3 FB Neck ROM: full    Dental  (+) Teeth Intact   Pulmonary  breath sounds clear to auscultation        Cardiovascular Rhythm:regular Rate:Normal     Neuro/Psych    GI/Hepatic   Endo/Other  diabetes, GestationalMorbid obesity  Renal/GU      Musculoskeletal   Abdominal   Peds  Hematology   Anesthesia Other Findings       Reproductive/Obstetrics (+) Pregnancy                             Anesthesia Physical Anesthesia Plan  ASA: III  Anesthesia Plan: Epidural   Post-op Pain Management:    Induction:   Airway Management Planned:   Additional Equipment:   Intra-op Plan:   Post-operative Plan:   Informed Consent: I have reviewed the patients History and Physical, chart, labs and discussed the procedure including the risks, benefits and alternatives for the proposed anesthesia with the patient or authorized representative who has indicated his/her understanding and acceptance.   Dental Advisory Given  Plan Discussed with:   Anesthesia Plan Comments: (Labs checked- platelets confirmed with RN in room. Fetal heart tracing, per RN, reported to be stable enough for sitting procedure. Discussed epidural, and patient consents to the procedure:  included risk of possible headache,backache, failed block, allergic reaction, and nerve injury. This patient was asked if she had any questions or concerns before the procedure started.)        Anesthesia Quick Evaluation  

## 2014-05-13 NOTE — H&P (Signed)
LABOR ADMISSION HISTORY AND PHYSICAL  April [name] is a 28 y.o. female G1P0 with IUP at 3373w2d by redating sono at 701w6d presenting for for IOL 2/2 A2/B DM. She reports +FMs, No LOF, no VB, no blurry vision, headaches or peripheral edema, and RUQ pain. She desires an epidural for labor pain control. She plans on breast feeding.   Dating: By 431w6d redating sono --->  Estimated Date of Delivery: 05/17/14  Sono:    @[redacted]w[redacted]d , CWD, normal anatomy, cephalic presentation, 4187g, 84%90% EFW  Prenatal History/Complications:  Past Medical History: Past Medical History  Diagnosis Date  . Gall stones     during 1st trimester of pregnancy    Past Surgical History: Past Surgical History  Procedure Laterality Date  . No past surgeries      Obstetrical History: OB History    Gravida Para Term Preterm AB TAB SAB Ectopic Multiple Living   1               Social History: History   Social History  . Marital Status: Married    Spouse Name: N/A    Number of Children: N/A  . Years of Education: N/A   Social History Main Topics  . Smoking status: Never Smoker   . Smokeless tobacco: Not on file  . Alcohol Use: No  . Drug Use: No  . Sexual Activity: Yes   Other Topics Concern  . Not on file   Social History Narrative    Family History: Family History  Problem Relation Age of Onset  . Hypertension Mother     Allergies: No Known Allergies  Prescriptions prior to admission  Medication Sig Dispense Refill Last Dose  . aspirin EC 81 MG tablet Take 1 tablet (81 mg total) by mouth daily. 30 tablet 4 05/11/2014 at Unknown time  . calcium carbonate (TUMS EX) 750 MG chewable tablet Chew 1 tablet by mouth daily.   05/12/2014 at Unknown time  . glyBURIDE (DIABETA) 2.5 MG tablet Take 0.5 tablets (1.25 mg total) by mouth at bedtime. 30 tablet 5 05/11/2014 at Unknown time  . Prenatal Vit-Fe Fumarate-FA (PRENATAL MULTIVITAMIN) TABS tablet Take 1 tablet by mouth daily.    05/11/2014 at Unknown  time     Review of Systems   All systems reviewed and negative except as stated in HPI  Blood pressure 134/91, pulse 79, temperature 98.9 F (37.2 C), temperature source Oral, resp. rate 20, height 5\' 2"  (1.575 m), weight 245 lb (111.131 kg), last menstrual period 08/10/2013. General appearance: alert and cooperative Lungs: clear to auscultation bilaterally Heart: regular rate and rhythm Abdomen: soft, non-tender; bowel sounds normal Pelvic: adequate Extremities: Homans sign is negative, no sign of DVT DTR's not examined Presentation: cephalic confirmed on ultrasound Fetal monitoringBaseline: 150 bpm, Variability: Good {> 6 bpm), Accelerations: Reactive and Decelerations: Absent Uterine activityNone Dilation: 2 Effacement (%): 60 Station: -2 Exam by:: Dr Loreta AveAcosta   Prenatal labs: ABO, Rh: --/--/O POS (11/27 2110) Antibody: NEG (11/27 2100) Rubella:   RPR: NON REAC (09/14 0929)  HBsAg: NEGATIVE (09/14 0929)  HIV: NONREACTIVE (09/14 0929)  GBS: Negative (11/02 0000)  1 hr Glucola unable to locate result from [place], abnormal Genetic screening  Cannot locate results from [facility] LGA    Clinic HRC  Dating LMP + 5 wk U/S  Genetic Screen 1 Screen:                 AFP:  Quad:                  NIPS:  Anatomic US Normal  GTT GDM A2/B  TDaP vaccine 02/27/14  Flu vaccine 02/27/14  GBS Negative  Contraception Undecided  Baby Food Breast  Pediatrician Undecided  Support Person Husband      Results for orders placed or performed during the hospital encounter of 05/12/14 (from the past 24 hour(s))  CBC   Collection Time: 05/12/14  9:00 PM  Result Value Ref Range   WBC 9.1 4.0 - 10.5 K/uL   RBC 3.83 (L) 3.87 - 5.11 MIL/uL   Hemoglobin 12.9 12.0 - 15.0 g/dL   HCT 30.835.9 (L) 65.736.0 - 84.646.0 %   MCV 93.7 78.0 - 100.0 fL   MCH 33.7 26.0 - 34.0 pg   MCHC 35.9 30.0 - 36.0 g/dL   RDW 96.213.4 95.211.5 - 84.115.5 %   Platelets 162 150 - 400 K/uL  Type and screen    Collection Time: 05/12/14  9:00 PM  Result Value Ref Range   ABO/RH(D) O POS    Antibody Screen NEG    Sample Expiration 05/15/2014   ABO/Rh   Collection Time: 05/12/14  9:10 PM  Result Value Ref Range   ABO/RH(D) O POS   Glucose, capillary   Collection Time: 05/12/14  9:35 PM  Result Value Ref Range   Glucose-Capillary 99 70 - 99 mg/dL    Patient Active Problem List   Diagnosis Date Noted  . Gestational diabetes 05/12/2014  . [redacted] weeks gestation of pregnancy   . Diabetes mellitus in pregnancy in third trimester   . Fetal macrosomia during pregnancy in third trimester, antepartum   . Cholelithiasis 03/23/2014  . Cholelithiasis affecting pregnancy in third trimester, antepartum 03/23/2014  . Supervision of high-risk pregnancy 01/09/2014  . Diabetes mellitus affecting pregnancy in second trimester, antepartum 12/26/2013    Assessment: April [name] is a 28 y.o. G1P0 at 7847w2d with suspected fetal macrosomia, declines blood transfusions here for IOL 2/2 A2/BDM   #Labor: place FB and give cytotec 25mcg PV #Pain: 100mcg fentanyl prn, epidural when foley out #FWB: Cat I, discussed risk of shoulder dystocia to both baby and mother, possible complications, possible need for cesarean section #ID:  GBS neg #MOF: breast #MOC:undecided #Circ:  N/a #transfusion: declines blood products, would prefer to die than receive blood transfusion.  Is agreeable to albumin, IV fluids, clotting factors.  April [name] 05/13/2014, 1:48 AM

## 2014-05-14 ENCOUNTER — Encounter (HOSPITAL_COMMUNITY)
Admission: RE | Disposition: A | Discharge status: Home / Self Care | Payer: Self-pay | Source: Ambulatory Visit | Attending: Obstetrics & Gynecology

## 2014-05-14 ENCOUNTER — Encounter (HOSPITAL_COMMUNITY): Payer: Self-pay

## 2014-05-14 DIAGNOSIS — O24419 Gestational diabetes mellitus in pregnancy, unspecified control: Secondary | ICD-10-CM

## 2014-05-14 DIAGNOSIS — Z3A39 39 weeks gestation of pregnancy: Secondary | ICD-10-CM

## 2014-05-14 DIAGNOSIS — Z98891 History of uterine scar from previous surgery: Secondary | ICD-10-CM

## 2014-05-14 LAB — GLUCOSE, CAPILLARY
Glucose-Capillary: 94 mg/dL (ref 70–99)
Glucose-Capillary: 95 mg/dL (ref 70–99)
Glucose-Capillary: 95 mg/dL (ref 70–99)
Glucose-Capillary: 98 mg/dL (ref 70–99)

## 2014-05-14 SURGERY — Surgical Case
Anesthesia: Epidural | Site: Abdomen

## 2014-05-14 MED ORDER — MEPERIDINE HCL 25 MG/ML IJ SOLN
INTRAMUSCULAR | Status: DC | PRN
  Administered 2014-05-14 (×2): 12.5 mg via INTRAVENOUS

## 2014-05-14 MED ORDER — ZOLPIDEM TARTRATE 5 MG PO TABS: 5.0000 mg | ORAL_TABLET | Freq: Every evening | ORAL | Status: DC | PRN

## 2014-05-14 MED ORDER — DEXAMETHASONE SODIUM PHOSPHATE 4 MG/ML IJ SOLN
INTRAMUSCULAR | Status: AC
  Filled 2014-05-14: qty 1

## 2014-05-14 MED ORDER — SCOPOLAMINE 1 MG/3DAYS TD PT72
MEDICATED_PATCH | TRANSDERMAL | Status: DC | PRN
  Administered 2014-05-14: 1 via TRANSDERMAL

## 2014-05-14 MED ORDER — ONDANSETRON HCL 4 MG/2ML IJ SOLN: 4.0000 mg | Freq: Three times a day (TID) | INTRAMUSCULAR | Status: DC | PRN

## 2014-05-14 MED ORDER — NALOXONE HCL 0.4 MG/ML IJ SOLN
0.4000 mg | INTRAMUSCULAR | Status: DC | PRN
Start: 2014-05-14 — End: 2014-05-17

## 2014-05-14 MED ORDER — LACTATED RINGERS IV SOLN
INTRAVENOUS | Status: DC | PRN
  Administered 2014-05-14 (×3): via INTRAVENOUS

## 2014-05-14 MED ORDER — DEXAMETHASONE SODIUM PHOSPHATE 4 MG/ML IJ SOLN
INTRAMUSCULAR | Status: DC | PRN
  Administered 2014-05-14: 4 mg via INTRAVENOUS

## 2014-05-14 MED ORDER — MEASLES, MUMPS & RUBELLA VAC ~~LOC~~ INJ
0.5000 mL | INJECTION | Freq: Once | SUBCUTANEOUS | Status: DC
  Filled 2014-05-14: qty 0.5

## 2014-05-14 MED ORDER — MEPERIDINE HCL 25 MG/ML IJ SOLN
INTRAMUSCULAR | Status: AC
  Filled 2014-05-14: qty 1

## 2014-05-14 MED ORDER — MAGNESIUM HYDROXIDE 400 MG/5ML PO SUSP: 30.0000 mL | ORAL | Status: DC | PRN

## 2014-05-14 MED ORDER — OXYTOCIN 40 UNITS IN LACTATED RINGERS INFUSION - SIMPLE MED: 62.5000 mL/h | INTRAVENOUS | Status: AC

## 2014-05-14 MED ORDER — ONDANSETRON HCL 4 MG PO TABS: 4.0000 mg | ORAL_TABLET | ORAL | Status: DC | PRN

## 2014-05-14 MED ORDER — NALBUPHINE HCL 10 MG/ML IJ SOLN: 5.0000 mg | Freq: Once | INTRAMUSCULAR | Status: AC | PRN

## 2014-05-14 MED ORDER — SODIUM CHLORIDE 0.9 % IJ SOLN: 3.0000 mL | INTRAMUSCULAR | Status: DC | PRN

## 2014-05-14 MED ORDER — SCOPOLAMINE 1 MG/3DAYS TD PT72
1.0000 | MEDICATED_PATCH | Freq: Once | TRANSDERMAL | Status: DC
  Filled 2014-05-14: qty 1

## 2014-05-14 MED ORDER — WITCH HAZEL-GLYCERIN EX PADS: 1.0000 "application " | MEDICATED_PAD | CUTANEOUS | Status: DC | PRN

## 2014-05-14 MED ORDER — SIMETHICONE 80 MG PO CHEW
80.0000 mg | CHEWABLE_TABLET | ORAL | Status: DC
  Administered 2014-05-14 – 2014-05-16 (×3): 80 mg via ORAL
  Filled 2014-05-14 (×3): qty 1

## 2014-05-14 MED ORDER — MEPERIDINE HCL 25 MG/ML IJ SOLN: 6.2500 mg | INTRAMUSCULAR | Status: DC | PRN

## 2014-05-14 MED ORDER — MIDAZOLAM HCL 2 MG/2ML IJ SOLN: 0.5000 mg | Freq: Once | INTRAMUSCULAR | Status: DC | PRN

## 2014-05-14 MED ORDER — DIPHENHYDRAMINE HCL 25 MG PO CAPS: 25.0000 mg | ORAL_CAPSULE | Freq: Four times a day (QID) | ORAL | Status: DC | PRN

## 2014-05-14 MED ORDER — KETOROLAC TROMETHAMINE 30 MG/ML IJ SOLN
INTRAMUSCULAR | Status: AC
  Filled 2014-05-14: qty 1

## 2014-05-14 MED ORDER — MORPHINE SULFATE (PF) 0.5 MG/ML IJ SOLN
INTRAMUSCULAR | Status: DC | PRN
  Administered 2014-05-14: 4 mg via EPIDURAL
  Administered 2014-05-14: 1 mg via INTRAVENOUS

## 2014-05-14 MED ORDER — KETOROLAC TROMETHAMINE 30 MG/ML IJ SOLN
30.0000 mg | Freq: Four times a day (QID) | INTRAMUSCULAR | Status: DC | PRN
  Administered 2014-05-14: 30 mg via INTRAMUSCULAR

## 2014-05-14 MED ORDER — SCOPOLAMINE 1 MG/3DAYS TD PT72
MEDICATED_PATCH | TRANSDERMAL | Status: AC
  Filled 2014-05-14: qty 1

## 2014-05-14 MED ORDER — DIBUCAINE 1 % RE OINT: 1.0000 "application " | TOPICAL_OINTMENT | RECTAL | Status: DC | PRN

## 2014-05-14 MED ORDER — METHYLERGONOVINE MALEATE 0.2 MG/ML IJ SOLN
INTRAMUSCULAR | Status: DC | PRN
  Administered 2014-05-14: 0.2 mg via INTRAMUSCULAR

## 2014-05-14 MED ORDER — DIPHENHYDRAMINE HCL 25 MG PO CAPS: 25.0000 mg | ORAL_CAPSULE | ORAL | Status: DC | PRN

## 2014-05-14 MED ORDER — DEXTROSE 5 % IV SOLN
1.0000 ug/kg/h | INTRAVENOUS | Status: DC | PRN
  Filled 2014-05-14: qty 2

## 2014-05-14 MED ORDER — SENNOSIDES-DOCUSATE SODIUM 8.6-50 MG PO TABS
2.0000 | ORAL_TABLET | ORAL | Status: DC
  Administered 2014-05-14 – 2014-05-16 (×3): 2 via ORAL
  Filled 2014-05-14 (×2): qty 2

## 2014-05-14 MED ORDER — LACTATED RINGERS IV SOLN
INTRAVENOUS | Status: DC
  Administered 2014-05-14 (×2): via INTRAVENOUS

## 2014-05-14 MED ORDER — LIDOCAINE-EPINEPHRINE (PF) 2 %-1:200000 IJ SOLN
INTRAMUSCULAR | Status: AC
  Filled 2014-05-14: qty 20

## 2014-05-14 MED ORDER — NALBUPHINE HCL 10 MG/ML IJ SOLN: 5.0000 mg | INTRAMUSCULAR | Status: DC | PRN

## 2014-05-14 MED ORDER — MENTHOL 3 MG MT LOZG: 1.0000 | LOZENGE | OROMUCOSAL | Status: DC | PRN

## 2014-05-14 MED ORDER — FENTANYL CITRATE 0.05 MG/ML IJ SOLN: 25.0000 ug | INTRAMUSCULAR | Status: DC | PRN

## 2014-05-14 MED ORDER — LACTATED RINGERS IV SOLN
INTRAVENOUS | Status: DC | PRN
  Administered 2014-05-14: 03:00:00 via INTRAVENOUS

## 2014-05-14 MED ORDER — ONDANSETRON HCL 4 MG/2ML IJ SOLN
INTRAMUSCULAR | Status: AC
  Filled 2014-05-14: qty 2

## 2014-05-14 MED ORDER — TETANUS-DIPHTH-ACELL PERTUSSIS 5-2.5-18.5 LF-MCG/0.5 IM SUSP: 0.5000 mL | Freq: Once | INTRAMUSCULAR | Status: DC

## 2014-05-14 MED ORDER — ACETAMINOPHEN 500 MG PO TABS
1000.0000 mg | ORAL_TABLET | Freq: Four times a day (QID) | ORAL | Status: AC
  Administered 2014-05-14: 1000 mg via ORAL
  Filled 2014-05-14: qty 2

## 2014-05-14 MED ORDER — ONDANSETRON HCL 4 MG/2ML IJ SOLN
INTRAMUSCULAR | Status: DC | PRN
  Administered 2014-05-14: 4 mg via INTRAVENOUS

## 2014-05-14 MED ORDER — KETOROLAC TROMETHAMINE 30 MG/ML IJ SOLN: 30.0000 mg | Freq: Four times a day (QID) | INTRAMUSCULAR | Status: DC | PRN

## 2014-05-14 MED ORDER — ONDANSETRON HCL 4 MG/2ML IJ SOLN: 4.0000 mg | INTRAMUSCULAR | Status: DC | PRN

## 2014-05-14 MED ORDER — OXYCODONE-ACETAMINOPHEN 5-325 MG PO TABS
1.0000 | ORAL_TABLET | ORAL | Status: DC | PRN
  Administered 2014-05-15 – 2014-05-16 (×2): 1 via ORAL
  Filled 2014-05-14 (×2): qty 1

## 2014-05-14 MED ORDER — PRENATAL MULTIVITAMIN CH
1.0000 | ORAL_TABLET | Freq: Every day | ORAL | Status: DC
  Administered 2014-05-15: 1 via ORAL
  Filled 2014-05-14: qty 1

## 2014-05-14 MED ORDER — IBUPROFEN 600 MG PO TABS: 600.0000 mg | ORAL_TABLET | Freq: Four times a day (QID) | ORAL | Status: DC | PRN

## 2014-05-14 MED ORDER — METHYLERGONOVINE MALEATE 0.2 MG/ML IJ SOLN
INTRAMUSCULAR | Status: AC
  Filled 2014-05-14: qty 1

## 2014-05-14 MED ORDER — DEXTROSE 5 % IV SOLN
2.0000 g | Freq: Once | INTRAVENOUS | Status: AC
  Administered 2014-05-14: 2 g via INTRAVENOUS
  Filled 2014-05-14: qty 2

## 2014-05-14 MED ORDER — LANOLIN HYDROUS EX OINT: 1.0000 "application " | TOPICAL_OINTMENT | CUTANEOUS | Status: DC | PRN

## 2014-05-14 MED ORDER — KETOROLAC TROMETHAMINE 30 MG/ML IJ SOLN: 15.0000 mg | Freq: Once | INTRAMUSCULAR | Status: DC | PRN

## 2014-05-14 MED ORDER — IBUPROFEN 600 MG PO TABS
600.0000 mg | ORAL_TABLET | Freq: Four times a day (QID) | ORAL | Status: DC
  Administered 2014-05-14 – 2014-05-15 (×4): 600 mg via ORAL
  Filled 2014-05-14 (×4): qty 1

## 2014-05-14 MED ORDER — SODIUM BICARBONATE 8.4 % IV SOLN
INTRAVENOUS | Status: AC
  Filled 2014-05-14: qty 50

## 2014-05-14 MED ORDER — SIMETHICONE 80 MG PO CHEW: 80.0000 mg | CHEWABLE_TABLET | ORAL | Status: DC | PRN

## 2014-05-14 MED ORDER — OXYCODONE-ACETAMINOPHEN 5-325 MG PO TABS
2.0000 | ORAL_TABLET | ORAL | Status: DC | PRN
  Administered 2014-05-15: 2 via ORAL
  Filled 2014-05-14: qty 2

## 2014-05-14 MED ORDER — SODIUM BICARBONATE 8.4 % IV SOLN
INTRAVENOUS | Status: DC | PRN
  Administered 2014-05-14: 10 mL via EPIDURAL

## 2014-05-14 MED ORDER — PROMETHAZINE HCL 25 MG/ML IJ SOLN: 6.2500 mg | INTRAMUSCULAR | Status: DC | PRN

## 2014-05-14 MED ORDER — BUPIVACAINE HCL (PF) 0.5 % IJ SOLN
INTRAMUSCULAR | Status: DC | PRN
  Administered 2014-05-14: 30 mL

## 2014-05-14 MED ORDER — OXYTOCIN 10 UNIT/ML IJ SOLN
INTRAMUSCULAR | Status: AC
  Filled 2014-05-14: qty 4

## 2014-05-14 MED ORDER — BUPIVACAINE HCL (PF) 0.5 % IJ SOLN
INTRAMUSCULAR | Status: AC
Start: 2014-05-14 — End: 2014-05-14
  Filled 2014-05-14: qty 30

## 2014-05-14 MED ORDER — LACTATED RINGERS IV SOLN
40.0000 [IU] | INTRAVENOUS | Status: DC | PRN
  Administered 2014-05-14: 40 [IU] via INTRAVENOUS

## 2014-05-14 MED ORDER — MORPHINE SULFATE 0.5 MG/ML IJ SOLN
INTRAMUSCULAR | Status: AC
Start: 2014-05-14 — End: 2014-05-14
  Filled 2014-05-14: qty 10

## 2014-05-14 MED ORDER — DIPHENHYDRAMINE HCL 50 MG/ML IJ SOLN
12.5000 mg | INTRAMUSCULAR | Status: DC | PRN
Start: 2014-05-14 — End: 2014-05-17

## 2014-05-14 SURGICAL SUPPLY — 35 items
CLAMP CORD UMBIL (MISCELLANEOUS) IMPLANT
CLOTH BEACON ORANGE TIMEOUT ST (SAFETY) ×2 IMPLANT
DRAPE SHEET LG 3/4 BI-LAMINATE (DRAPES) IMPLANT
DRESSING DISP NPWT PICO 4X12 (MISCELLANEOUS) ×2 IMPLANT
DRSG OPSITE POSTOP 4X10 (GAUZE/BANDAGES/DRESSINGS) ×2 IMPLANT
DURAPREP 26ML APPLICATOR (WOUND CARE) ×2 IMPLANT
ELECT REM PT RETURN 9FT ADLT (ELECTROSURGICAL) ×2
ELECTRODE REM PT RTRN 9FT ADLT (ELECTROSURGICAL) ×1 IMPLANT
EXTRACTOR VACUUM M CUP 4 TUBE (SUCTIONS) IMPLANT
GLOVE BIOGEL PI IND STRL 7.0 (GLOVE) ×1 IMPLANT
GLOVE BIOGEL PI INDICATOR 7.0 (GLOVE) ×1
GLOVE ECLIPSE 7.0 STRL STRAW (GLOVE) ×2 IMPLANT
GOWN STRL REUS W/TWL LRG LVL3 (GOWN DISPOSABLE) ×4 IMPLANT
HEMOSTAT SURGICEL 2X14 (HEMOSTASIS) ×2 IMPLANT
KIT ABG SYR 3ML LUER SLIP (SYRINGE) IMPLANT
NEEDLE HYPO 22GX1.5 SAFETY (NEEDLE) ×2 IMPLANT
NEEDLE HYPO 25X5/8 SAFETYGLIDE (NEEDLE) ×2 IMPLANT
NS IRRIG 1000ML POUR BTL (IV SOLUTION) ×2 IMPLANT
PACK C SECTION WH (CUSTOM PROCEDURE TRAY) ×2 IMPLANT
PAD ABD 7.5X8 STRL (GAUZE/BANDAGES/DRESSINGS) ×2 IMPLANT
PAD OB MATERNITY 4.3X12.25 (PERSONAL CARE ITEMS) ×2 IMPLANT
RTRCTR C-SECT PINK 25CM LRG (MISCELLANEOUS) IMPLANT
SPONGE LAP 18X18 X RAY DECT (DISPOSABLE) ×2 IMPLANT
STAPLER VISISTAT 35W (STAPLE) IMPLANT
SUT PDS AB 0 CT1 27 (SUTURE) IMPLANT
SUT PDS AB 0 CTX 36 PDP370T (SUTURE) ×2 IMPLANT
SUT PLAIN 2 0 XLH (SUTURE) ×2 IMPLANT
SUT VIC AB 0 CT1 36 (SUTURE) ×4 IMPLANT
SUT VIC AB 0 CTX 36 (SUTURE) ×2
SUT VIC AB 0 CTX36XBRD ANBCTRL (SUTURE) ×2 IMPLANT
SUT VIC AB 4-0 KS 27 (SUTURE) ×2 IMPLANT
SYR CONTROL 10ML LL (SYRINGE) ×2 IMPLANT
TOWEL OR 17X24 6PK STRL BLUE (TOWEL DISPOSABLE) ×2 IMPLANT
TRAY FOLEY CATH 14FR (SET/KITS/TRAYS/PACK) ×2 IMPLANT
WATER STERILE IRR 1000ML POUR (IV SOLUTION) ×2 IMPLANT

## 2014-05-14 NOTE — Op Note (Signed)
April [name] PROCEDURE DATE: 05/14/2014  PREOPERATIVE DIAGNOSES: Intrauterine pregnancy at 6852w4d weeks gestation; protracted active phase - arrest of cervical dilation and descent; A2GDM with concern about likely LGA   POSTOPERATIVE DIAGNOSES: The same  PROCEDURE: Primary Low Transverse Cesarean Section  SURGEON:  Dr. Jaynie CollinsUgonna Signora Zucco  ANESTHESIOLOGIST: Dr.Freeman Jean RosenthalJackson  INDICATIONS: April [name] is a 28 y.o. G1P0 at 7352w4d here for cesarean section secondary to the indications listed under preoperative diagnoses; please see preoperative note for further details.  The risks of cesarean section were discussed with the patient including but were not limited to: bleeding which may require further procedures or reoperation; infection which may require antibiotics; injury to bowel, bladder, ureters or other surrounding organs; injury to the fetus; need for additional procedures including hysterectomy in the event of a life-threatening hemorrhage; placental abnormalities wth subsequent pregnancies, incisional problems, thromboembolic phenomenon and other postoperative/anesthesia complications.   The patient concurred with the proposed plan, giving informed written consent for the procedure. Of note, patient refuses all blood products due to religious reasons.    FINDINGS:  Viable female infant in cephalic presentation.  Apgars 8 and 9, weight 8 lb 10 oz.  Heavy meconium in the amniotic fluid. Loose nuchal cord x 1.  Intact placenta, three vessel cord.  Normal uterus, fallopian tubes and ovaries bilaterally.  ANESTHESIA: Epidural INTRAVENOUS FLUIDS: 3400 ml ESTIMATED BLOOD LOSS: 800 ml URINE OUTPUT:  100 ml SPECIMENS: Placenta sent to pathology COMPLICATIONS: None immediate  PROCEDURE IN DETAIL:  The patient preoperatively received intravenous antibiotics and had sequential compression devices applied to her lower extremities.  She was then taken to the operating room where the epidural  anesthesia was dosed up to surgical level and was found to be adequate. She was then placed in a dorsal supine position with a leftward tilt, and prepped and draped in a sterile manner.  A foley catheter was placed into her bladder and attached to constant gravity.  After an adequate timeout was performed, a Pfannenstiel skin incision was made with scalpel and carried through to the underlying layer of fascia. The fascia was incised in the midline, and this incision was extended bilaterally using the Mayo scissors.  Kocher clamps were applied to the superior aspect of the fascial incision and the underlying rectus muscles were dissected off bluntly. A similar process was carried out on the inferior aspect of the fascial incision. The rectus muscles were separated in the midline bluntly and the peritoneum was entered bluntly. Attention was turned to the lower uterine segment where a low transverse hysterotomy was made with a scalpel and extended bilaterally bluntly.  The infant was successfully delivered, the cord was clamped and cut and the infant was handed over to awaiting neonatology team. Uterine massage was then administered, and the placenta delivered intact with a three-vessel cord. The uterus was then cleared of clot and debris.  The hysterotomy was closed with 0 Vicryl in a running locked fashion, and an imbricating layer was also placed with 0 Vicryl. The pelvis was cleared of all clot and debris. Hemostasis was confirmed on all surfaces.  The peritoneum and the muscles were reapproximated using 0 Vicryl interrupted stitches. The fascia was then closed using 0 PDS in a running fashion.  The subcutaneous layer was irrigated, then reapproximated with 2-0 plain gut interrupted stitches, and 30 ml of 0.5% Marcaine was injected subcutaneously around the incision.  The skin was closed with a 4-0 Vicryl subcuticular stitch.  After the skin was closed, a PICO  disposable negative pressure wound therapy device was  placed over the incision.  The suction was activated at a pressure of 80mmHg.  The adhesive was affixed well and there were no leaks noted. The patient tolerated the procedure well. Sponge, lap, instrument and needle counts were correct x 2.  She was taken to the recovery room in stable condition.    Jaynie CollinsUGONNA  Kevonte Vanecek, MD, FACOG Attending Obstetrician & Gynecologist Faculty Practice, [facility] [place]

## 2014-05-14 NOTE — Addendum Note (Signed)
Addendum  created 05/14/14 1430 by Shanon PayorSuzanne M Valda Christenson, CRNA   Modules edited: Notes Section   Notes Section:  File: [id number]

## 2014-05-14 NOTE — Transfer of Care (Signed)
Immediate Anesthesia Transfer of Care Note  Patient: April [name]  Procedure(s) Performed: Procedure(s): CESAREAN SECTION (N/A)  Patient Location: PACU  Anesthesia Type:Epidural  Level of Consciousness: awake  Airway & Oxygen Therapy: Patient Spontanous Breathing  Post-op Assessment: Report given to PACU RN and Post -op Vital signs reviewed and stable  Post vital signs: stable  Complications: No apparent anesthesia complications

## 2014-05-14 NOTE — Progress Notes (Signed)
April [name] is a 28 y.o. G1P0 at 1265w4d undergoing IOL for A2GDM  Subjective: Comfortable with epidural  Objective: BP 127/71 mmHg  Pulse 62  Temp(Src) 98.6 F (37 C) (Oral)  Resp 18  Ht 5\' 2"  (1.575 m)  Wt 245 lb (111.131 kg)  BMI 44.80 kg/m2  LMP 08/10/2013 I/O last 3 completed shifts: In: -  Out: 450 [Emesis/NG output:450]    FHT:  FHR: 150s bpm, variability: moderate,  accelerations:  Present,  decelerations:  Absent- occ early variables UC:   regular, every 2-3 minutes SVE:   Dilation:  8 (no change) Effacement (%): 90 Station: -2 Exam by:: Dr Macon LargeAnyanwu- Adequate MVUs for about two hours  Labs: Lab Results  Component Value Date   WBC 9.1 05/12/2014   HGB 12.9 05/12/2014   HCT 35.9* 05/12/2014   MCV 93.7 05/12/2014   PLT 162 05/12/2014    Assessment / Plan: Protracted active phase - arrest of cervical dilation and descent A2GDM with concern about likely LGA Reassuring FHR pattern.  No cervical change.  Recommended cesarean delivery.  Aware that patient does not want any blood products due to religious reasons.  The risks of cesarean section discussed with the patient included but were not limited to: bleeding which may require further procedures or reoperation; infection which may require antibiotics; injury to bowel, bladder, ureters or other surrounding organs; injury to the fetus; need for additional procedures including hysterectomy in the event of a life-threatening hemorrhage; placental abnormalities wth subsequent pregnancies, incisional problems, thromboembolic phenomenon and other postoperative/anesthesia complications. The patient concurred with the proposed plan, giving informed written consent for the procedure.  Anesthesia and OR aware. Preoperative prophylactic antibiotics and SCDs ordered on call to the OR.  To OR when ready.    Lindee Leason A MD  05/14/2014, 2:44 AM

## 2014-05-14 NOTE — Lactation Note (Signed)
This note was copied from the chart of April Neri Kanan. Lactation Consultation Note      Follow up consult with mom and baby, now 111 hours old. Mom was able to hand express 1 ml for the 1400 feeding, and the baby was again supplemented with Pregestimil. I then set up DEP for mom, and showed mom and dad ow to set premie setting, and instructed mom in the care of both hand and DEP parts. I decreased mom to size 21 flanges.   Patient Name: April [name] Reason for consult: Follow-up assessment   Maternal Data    Feeding Feeding Type: Formula  LATCH Score/Interventions                      Lactation Tools Discussed/Used Tools: Pump Breast pump type: Double-Electric Breast Pump   Consult Status Consult Status: Follow-up Date: 05/15/14 Follow-up type: In-patient    April [name], April [name] [name], 3:04 PM

## 2014-05-14 NOTE — Plan of Care (Signed)
Problem: Phase I Progression Outcomes Goal: Pain controlled with appropriate interventions Outcome: Completed/Met Date Met:  05/14/14 Goal: IS, TCDB as ordered Outcome: Adequate for Discharge Goal: Other Phase I Outcomes/Goals Outcome: Not Applicable Date Met:  89/16/94

## 2014-05-14 NOTE — Plan of Care (Signed)
Problem: Phase I Progression Outcomes Goal: OOB as tolerated unless otherwise ordered Outcome: Completed/Met Date Met:  05/14/14 Goal: IS, TCDB as ordered Outcome: Completed/Met Date Met:  05/14/14  Problem: Phase II Progression Outcomes Goal: Tolerating diet Outcome: Completed/Met Date Met:  05/14/14

## 2014-05-14 NOTE — Anesthesia Postprocedure Evaluation (Signed)
Anesthesia Post Note  Patient: April [name]  Procedure(s) Performed: Procedure(s) (LRB): CESAREAN SECTION (N/A)  Anesthesia type: Epidural  Patient location: Mother/Baby  Post pain: Pain level controlled  Post assessment: Post-op Vital signs reviewed  Last Vitals:  Filed Vitals:   05/14/14 1330  BP: 104/66  Pulse: 79  Temp: 37 C  Resp: 20    Post vital signs: Reviewed  Level of consciousness:alert  Complications: No apparent anesthesia complications

## 2014-05-14 NOTE — Anesthesia Postprocedure Evaluation (Signed)
  Anesthesia Post Note  Patient: April [name]  Procedure(s) Performed: Procedure(s) (LRB): CESAREAN SECTION (N/A)  Anesthesia type: Epidural  Patient location: PACU  Post pain: Pain level controlled  Post assessment: Post-op Vital signs reviewed  Last Vitals:  Filed Vitals:   05/14/14 0251  BP: 115/77  Pulse: 72  Temp:   Resp:     Post vital signs: Reviewed  Level of consciousness: awake  Complications: No apparent anesthesia complications

## 2014-05-14 NOTE — Progress Notes (Addendum)
April [name] is a 28 y.o. G1P0 at 4232w4d undergoing IOL for A2GDM  Subjective: Comfortable with epidural  Objective: BP 142/81 mmHg  Pulse 66  Temp(Src) 98.6 F (37 C) (Oral)  Resp 18  Ht 5\' 2"  (1.575 m)  Wt 245 lb (111.131 kg)  BMI 44.80 kg/m2  LMP 08/10/2013 I/O last 3 completed shifts: In: -  Out: 450 [Emesis/NG output:450]    FHT:  FHR: 150s bpm, variability: moderate,  accelerations:  Present,  decelerations:  Absent- occ early variables UC:   regular, every 2-3 minutes SVE:   Dilation: 8 Effacement (%): 90 Station: -2 Exam by:: Dr Lynetta MareAnyawuRolene Arbour- IAdequate MVUs for about two hours  Labs: Lab Results  Component Value Date   WBC 9.1 05/12/2014   HGB 12.9 05/12/2014   HCT 35.9* 05/12/2014   MCV 93.7 05/12/2014   PLT 162 05/12/2014    Assessment / Plan: Protracted active phase LGA A2GDM  No cervical change.  Discussed possible need for cesarean delivery.  Will reevaluate in one hour. If no change, will proceed with cesarean delivery. Continue pitocin per protocol for now.  Reassuring FHR pattern.  April [name] A MD  05/14/2014, 1:16 AM

## 2014-05-14 NOTE — Lactation Note (Signed)
This note was copied from the chart of April Ronnie Soderquist. Lactation Consultation Note      Initial consult with this mom of a term LGA baby, now 7 hours old. Mom was GDM on glyburide. Mom has wide spaced breasts with small amount of breast tissue, flat nipple on left and inverted nipple on right. The baby was able to latch in both cross cradle and football hold, mom seemed to prefer football. The tissue needs to be compressed, and the latch is shallow, with dimpling in baby's cheeks seen with sucking. On exam of baby's mouth, her tongue seems to have limited ability to elevate, and forms a bowl shape - this may be an indication of a mid posterior  tight frenulum. I did not mention this to the parents at this time. After breast feeding, I showed mom how to hand express into a foley cup. Mom will need more time to learn this technique, but is very motivated to learn. I cup fed  Elly with a foley cup and she did well. I told mom feeding at least every 3 hours and lots of skin to skin is indicated due to baby's hypoglycemia, but feeding with cues is also advised  DEP will be added with next feeding, every 3 hours, followed with hand expression. Basic lactation and breast feeding teaching done, from baby and me bbokd and lactation info.   Patient Name: April [name] ZOXWR'UToday's Date: 05/14/2014 Reason for consult: Initial assessment;Other (Comment) (LGA, hypoglycemia, mom with GDM on glyburide)   Maternal Data Formula Feeding for Exclusion: No Has patient been taught Hand Expression?: Yes Does the patient have breastfeeding experience prior to this delivery?: No  Feeding Feeding Type: Formula Length of feed: 10 min (baby on and off both breasts for at least 20 minutes, with about 10 minutes of sucking)  LATCH Score/Interventions Latch: Repeated attempts needed to sustain latch, nipple held in mouth throughout feeding, stimulation needed to elicit sucking reflex. Intervention(s): Adjust  position;Assist with latch;Breast massage;Breast compression  Audible Swallowing: None Intervention(s): Skin to skin;Hand expression  Type of Nipple: Inverted (right nipple inverted left flat, wide spaced breast with small amount of breast tissue) Intervention(s): Hand pump;Double electric pump Intervention(s): Hand pump  Comfort (Breast/Nipple): Soft / non-tender     Hold (Positioning): Assistance needed to correctly position infant at breast and maintain latch. Intervention(s): Breastfeeding basics reviewed;Support Pillows;Position options;Skin to skin  LATCH Score: 4  Lactation Tools Discussed/Used WIC Program: Yes [place] in [place] Pump Review: Setup, frequency, and cleaning;Milk Storage;Other (comment) Initiated by:: clee RN LC Date initiated:: 05/14/14   Consult Status Consult Status: Follow-up Date: 05/15/14 (at 1400) Follow-up type: In-patient    Alfred LevinsLee, Randie Tallarico Anne 05/14/2014, 11:32 AM

## 2014-05-14 NOTE — Plan of Care (Signed)
Problem: Phase I Progression Outcomes Goal: Foley catheter patent Outcome: Completed/Met Date Met:  05/14/14 Goal: VS, stable, temp < 100.4 degrees F Outcome: Completed/Met Date Met:  05/14/14 Goal: Initial discharge plan identified Outcome: Completed/Met Date Met:  05/14/14

## 2014-05-15 ENCOUNTER — Encounter (HOSPITAL_COMMUNITY): Payer: Self-pay | Admitting: Obstetrics & Gynecology

## 2014-05-15 LAB — CBC
HCT: 22.6 % — ABNORMAL LOW (ref 36.0–46.0)
Hemoglobin: 8.2 g/dL — ABNORMAL LOW (ref 12.0–15.0)
MCH: 34.2 pg — ABNORMAL HIGH (ref 26.0–34.0)
MCHC: 36.3 g/dL — ABNORMAL HIGH (ref 30.0–36.0)
MCV: 94.2 fL (ref 78.0–100.0)
Platelets: 110 10*3/uL — ABNORMAL LOW (ref 150–400)
RBC: 2.4 MIL/uL — ABNORMAL LOW (ref 3.87–5.11)
RDW: 13.6 % (ref 11.5–15.5)
WBC: 13.8 10*3/uL — ABNORMAL HIGH (ref 4.0–10.5)

## 2014-05-15 LAB — GLUCOSE, CAPILLARY: Glucose-Capillary: 122 mg/dL — ABNORMAL HIGH (ref 70–99)

## 2014-05-15 MED ORDER — COMPLETENATE 29-1 MG PO CHEW
1.0000 | CHEWABLE_TABLET | Freq: Every day | ORAL | Status: DC
Start: 2014-05-16 — End: 2014-05-17
  Administered 2014-05-16 – 2014-05-17 (×2): 1 via ORAL
  Filled 2014-05-15 (×2): qty 1

## 2014-05-15 MED ORDER — IBUPROFEN 100 MG/5ML PO SUSP
600.0000 mg | Freq: Four times a day (QID) | ORAL | Status: DC
  Administered 2014-05-15 – 2014-05-17 (×8): 600 mg via ORAL
  Filled 2014-05-15 (×8): qty 30

## 2014-05-15 NOTE — Progress Notes (Addendum)
Subjective: Postpartum Day 1: Cesarean Delivery Patient reports incisional pain, tolerating PO and no problems voiding.    Objective: Vital signs in last 24 hours: Temp:  [97.7 F (36.5 C)-99 F (37.2 C)] 97.7 F (36.5 C) (11/30 0650) Pulse Rate:  [65-89] 65 (11/30 0650) Resp:  [18-20] 18 (11/30 0650) BP: (94-122)/(44-95) 94/44 mmHg (11/30 0650) SpO2:  [96 %-99 %] 99 % (11/30 0346)  Physical Exam:  General: alert, cooperative and no distress Lochia: appropriate Uterine Fundus: firm Incision: healing well, no significant drainage DVT Evaluation: No evidence of DVT seen on physical exam.   Recent Labs  05/12/14 2100 05/15/14 0545  HGB 12.9 8.2*  HCT 35.9* 22.6*   CBG 122  Assessment/Plan: Status post Cesarean section. Doing well postoperatively.  Continue current care.  [facility] Center[facility]ILLIAMS,April Pech 05/15/2014, 7:18 AM

## 2014-05-15 NOTE — Plan of Care (Signed)
Problem: Consults Goal: Diabetes Guidelines if Diabetic/Glucose > 140 If diabetic or lab glucose is > 140 mg/dl - Initiate Diabetes/Hyperglycemia Guidelines & Document Interventions  Outcome: Not Applicable Date Met:  58/83/25  Problem: Phase II Progression Outcomes Goal: Pain controlled on oral analgesia Outcome: Completed/Met Date Met:  05/15/14 Goal: Progress activity as tolerated unless otherwise ordered Outcome: Completed/Met Date Met:  05/15/14 Goal: Afebrile, VS remain stable Outcome: Completed/Met Date Met:  05/15/14

## 2014-05-15 NOTE — Plan of Care (Signed)
Problem: Consults Goal: Postpartum Patient Education (See Patient Education module for education specifics.)  Outcome: Completed/Met Date Met:  05/15/14  Problem: Phase I Progression Outcomes Goal: Voiding adequately Outcome: Completed/Met Date Met:  05/15/14  Problem: Phase II Progression Outcomes Goal: Incision intact & without signs/symptoms of infection Outcome: Completed/Met Date Met:  05/15/14  Problem: Discharge Progression Outcomes Goal: Tolerating diet Outcome: Completed/Met Date Met:  05/15/14

## 2014-05-15 NOTE — Lactation Note (Signed)
This note was copied from the chart of April Gennett Horstman. Lactation Consultation Note Baby has been syringe fed and mom has been pumping.  Mom has wide spaced breasts and is not seeing any colostrum yet.  Encouraged mom to continue pumping.  She reports that the baby sucks on the curved tip syringe and now won't open her mouth. She was placed on her mom's chest and began rooting and opening her mouth.  She attached to the nipple but quickly came off though she continued to try and reattach. I suggested a NS #20 with an SNS behind it.  She agreed.  Mom was taught how to apply the NS and was able to apply it independently.  Baby mouthed it for a minute or so and then attached.  She had periods of nutritive and non-nutritive suckling with audible air leaks at times.  She was able to transfer 7ml.  We finger fed her an additional 3 ml.  The feeding took about 40 minutes.  Much encouragement given to Mom.  Advised her to pump for 10 minutes after each feeding.  Instructed her on volume guidelines and when to increase amounts.  She was agreeable to the plan.   Patient Name: April [name] BJYNW'GToday's Date: 05/15/2014 Reason for consult: Follow-up assessment   Maternal Data    Feeding Feeding Type: Formula Length of feed: 20 min  LATCH Score/Interventions Latch: Grasps breast easily, tongue down, lips flanged, rhythmical sucking. (used NS) Intervention(s): Adjust position;Assist with latch;Breast compression  Audible Swallowing: A few with stimulation  Type of Nipple: Everted at rest and after stimulation (inverted in the center)  Comfort (Breast/Nipple): Soft / non-tender     Hold (Positioning): Assistance needed to correctly position infant at breast and maintain latch.  LATCH Score: 8  Lactation Tools Discussed/Used Tools: Nipple Shields Nipple shield size: 20 Breast pump type: Double-Electric Breast Pump   Consult Status Consult Status: Follow-up Date: 05/16/14 Follow-up  type: In-patient    Soyla DryerJoseph, Kember Boch 05/15/2014, 5:35 PM

## 2014-05-16 ENCOUNTER — Encounter: Payer: Self-pay | Admitting: Obstetrics & Gynecology

## 2014-05-16 LAB — GLUCOSE, CAPILLARY: Glucose-Capillary: 142 mg/dL — ABNORMAL HIGH (ref 70–99)

## 2014-05-16 MED ORDER — ACETAMINOPHEN 160 MG/5ML PO SOLN: 650.0000 mg | ORAL | Status: DC | PRN

## 2014-05-16 MED ORDER — ACETAMINOPHEN 160 MG/5ML PO SOLN
325.0000 mg | ORAL | Status: DC | PRN
  Administered 2014-05-16 (×2): 325 mg via ORAL
  Filled 2014-05-16 (×2): qty 20.3

## 2014-05-16 MED ORDER — OXYCODONE HCL 5 MG/5ML PO SOLN: 10.0000 mg | ORAL | Status: DC | PRN

## 2014-05-16 MED ORDER — FERROUS SULFATE 325 (65 FE) MG PO TABS
325.0000 mg | ORAL_TABLET | Freq: Two times a day (BID) | ORAL | Status: DC
  Administered 2014-05-16 – 2014-05-17 (×3): 325 mg via ORAL
  Filled 2014-05-16 (×3): qty 1

## 2014-05-16 MED ORDER — OXYCODONE HCL 5 MG/5ML PO SOLN
5.0000 mg | ORAL | Status: DC | PRN
  Administered 2014-05-16 (×2): 5 mg via ORAL
  Filled 2014-05-16 (×2): qty 5

## 2014-05-16 NOTE — Plan of Care (Signed)
Problem: Phase II Progression Outcomes Goal: Other Phase II Outcomes/Goals Outcome: Completed/Met Date Met:  05/16/14     

## 2014-05-16 NOTE — Progress Notes (Signed)
Post Partum Day 2 Subjective:  April [name] is a 28 y.o. G1P1000 7756w4d s/p pLTCS.  No acute events overnight.  Pt denies problems with ambulating, voiding or po intake.  She denies nausea or vomiting.  Pain is well controlled.  She has had flatus. She has not had bowel movement.  Lochia Moderate.  Plan for birth control is IUD.  Method of Feeding: Breast  Objective: Blood pressure 105/46, pulse 80, temperature 98.1 F (36.7 C), temperature source Oral, resp. rate 18, height 5\' 2"  (1.575 m), weight 245 lb (111.131 kg), last menstrual period 08/10/2013, SpO2 99 %, unknown if currently breastfeeding.  Physical Exam:  General: alert, cooperative and no distress Lochia:normal flow Chest: normal effort Heart: normal rate Abdomen: +BS, soft, nontender,  Uterine Fundus: firm DVT Evaluation: No evidence of DVT seen on physical exam.   Recent Labs  05/15/14 0545  HGB 8.2*  HCT 22.6*    Assessment/Plan:  ASSESSMENT: April [name] is a 28 y.o. G1P1000 5156w4d s/p pLTCS for arrest of descent. Doing well. Having some issues with lactation and needs continued support.   Plan for discharge tomorrow, Breastfeeding, Lactation consult and Contraception Copper IUD   LOS: 4 days   William DaltonMcEachern, Marlyce Mcdougald 05/16/2014, 7:46 AM

## 2014-05-16 NOTE — Progress Notes (Signed)
UR completed 

## 2014-05-16 NOTE — Lactation Note (Addendum)
This note was copied from the chart of April Nasha Keckler. Lactation Consultation Note  Mom reports that BF is going well.  SHe continues to use a NS and SNS.  Breast milk is starting to be expressed. Baby continues to transfer slowly but mom is patient with her and works with her until the SNS is empty.  I observed about 20 ml in the NS when I was in the room. Follow-up outpatient appointment to be scheduled prior to DC tomorrow. She has a double SNS in her room which I explained to her.  Patient Name: April [name] Today's Date: 05/16/2014     Maternal Data    Feeding Feeding Type: Breast Milk with Formula added  [facility]                      Lactation Tools Discussed/Used     Consult Status      April [name], April [name] 05/16/2014, 9:54 PM

## 2014-05-17 LAB — GLUCOSE, CAPILLARY: Glucose-Capillary: 80 mg/dL (ref 70–99)

## 2014-05-17 MED ORDER — OXYCODONE-ACETAMINOPHEN 5-325 MG PO TABS: 1.0000 | ORAL_TABLET | ORAL | Status: DC | PRN

## 2014-05-17 MED ORDER — FUROSEMIDE 40 MG PO TABS
40.0000 mg | ORAL_TABLET | Freq: Once | ORAL | Status: AC
  Administered 2014-05-17: 40 mg via ORAL
  Filled 2014-05-17: qty 1

## 2014-05-17 NOTE — Discharge Instructions (Signed)

## 2014-05-17 NOTE — Lactation Note (Signed)
This note was copied from the chart of April [name]. Lactation Consultation Note  Patient Name: April Cecille Poraceli Hallahan UJWJX'BToday's Date: 05/17/2014 Reason for consult: Follow-up assessment  [facility]; Mom knows to pump q3h for 15-20 min. (or for 2 min past the last drop, but not for longer than 30 min). Mom reports getting 5 mL from her R breast when she pumped for 25 min earlier (baby was being fed on the L). Because it is possible that baby is getting such a small amount of breast milk, I suggested that Mom switch to regular Similac (from Alimentum) so that baby can benefit from the lactose for brain growth.   Anatomy: Mom's breasts are hypoplastic in the lower lobes.  There is some filling in the upper lobes & I was able to express some colostrum.  Mom does report breast change with pregnancy (small increase in size, per Mom).  Mom is at high risk for low milk supply.  Peds appt tomorrow. LC appt & WIC appt on 05-25-14.    Lurline HareRichey, Emily [facility]se [facility] 05/17/2014, 10:29 AM

## 2014-05-17 NOTE — Discharge Summary (Signed)
Obstetric Discharge Summary Reason for Admission: induction of labor Prenatal Procedures: ultrasound Intrapartum Procedures: cesarean: low cervical, transverse Postpartum Procedures: none Complications-Operative and Postpartum: none HEMOGLOBIN  Date Value Ref Range Status  05/15/2014 8.2* 12.0 - 15.0 g/dL Final   HCT  Date Value Ref Range Status  05/15/2014 22.6* 36.0 - 46.0 % Final    Physical Exam:  General: alert, cooperative, appears stated age and no distress Lochia: appropriate Uterine Fundus: firm Incision: healing well, no significant drainage DVT Evaluation: No evidence of DVT seen on physical exam. Negative Homan's sign.  Discharge Diagnoses: Term Pregnancy-delivered  Discharge Information: Date: 05/17/2014 Activity: pelvic rest Diet: routine Medications: Ibuprofen and Percocet Condition: stable Instructions: refer to practice specific booklet Discharge to: home  Physician Obstetric Discharge Summary  Patient ID: April [name] MRN: [id number] DOB/AGE: 70/09/1985 28 y.o.  Reason for Admission: induction of labor Prenatal Procedures: ultrasound Intrapartum Procedures: cesarean: low cervical, transverse Postpartum Procedures: none Complications-Operative and Postpartum: none  Delivery Note At 3:26 AM a viable female was delivered via C-Section, Low Transverse (Presentation: Left Occiput Anterior).  APGAR: 8, 9; weight 8 lb 10 oz (3912 g).   Placenta status: Intact, Manual removal.  Cord: 3 vessels with the following complications: None.  Cord pH:   Anesthesia: Epidural  Episiotomy: None Lacerations: None Est. Blood Loss (mL):  800 mL  Mom to postpartum.  Baby to Couplet care / Skin to Skin.  April [name], April [name] 05/17/2014, 6:27 AM    APGAR: , ; weight  .    H/H:  Lab Results  Component Value Date/Time   HGB 8.2* 05/15/2014 05:45 AM   HCT 22.6* 05/15/2014 05:45 AM    Brief Hospital Course: April [name] is a G1P1000 who underwent cesarean  section on 05/12/2014 - 05/14/2014.  Patient had an uncomplicated surgery; for further details of this surgery, please refer to the operative note.  Patient had an uncomplicated postpartum course.  By time of discharge on POD#2/PPD#2, her pain was controlled on oral pain medications; she had appropriate lochia and was ambulating, voiding without difficulty, tolerating regular diet and passing flatus.   She was deemed stable for discharge to home.    Discharge Diagnoses: Term Pregnancy-delivered and Failed induction  Discharge Information: Date: 05/17/2014 Activity: pelvic rest Diet: routine Baby feeding: plans to breastfeed Contraception: IUD Medications: Ibuprofen and Percocet Discharged Condition: good Instructions: refer to practice specific booklet Discharge to: home  Signed: Rulon AbideKELI [name], M.[name]. [facility] 05/17/2014, 6:27 AM    Newborn Data: Live born female  Birth Weight: 8 lb 10 oz (3912 g) APGAR: 8, 9  Home with mother.  April [name], April [name] 05/17/2014, 6:27 AM

## 2014-05-17 NOTE — Plan of Care (Signed)
Problem: Discharge Progression Outcomes Goal: Barriers To Progression Addressed/Resolved Outcome: Completed/Met Date Met:  91/91/66 Goal: Complications resolved/controlled Outcome: Completed/Met Date Met:  05/17/14 Goal: Pain controlled with appropriate interventions Outcome: Completed/Met Date Met:  05/17/14 Goal: Afebrile, VS remain stable at discharge Outcome: Completed/Met Date Met:  05/17/14 Goal: Remove staples per MD order Outcome: Not Applicable Date Met:  06/00/45

## 2014-05-17 NOTE — Plan of Care (Signed)
Problem: Discharge Progression Outcomes Goal: Discharge plan in place and appropriate Outcome: Completed/Met Date Met:  05/17/14 Goal: Other Discharge Outcomes/Goals Outcome: Completed/Met Date Met:  05/17/14

## 2014-05-17 NOTE — Plan of Care (Signed)
Problem: Discharge Progression Outcomes Goal: Activity appropriate for discharge plan Outcome: Completed/Met Date Met:  05/17/14     

## 2014-05-21 ENCOUNTER — Encounter (HOSPITAL_COMMUNITY): Payer: Self-pay | Admitting: *Deleted

## 2014-05-21 ENCOUNTER — Inpatient Hospital Stay (HOSPITAL_COMMUNITY)
Admission: AD | Admit: 2014-05-21 | Discharge: 2014-05-21 | Disposition: A | Discharge status: Home / Self Care | Payer: Self-pay | Source: Ambulatory Visit | Attending: Obstetrics & Gynecology | Admitting: Obstetrics & Gynecology

## 2014-05-21 DIAGNOSIS — Z4889 Encounter for other specified surgical aftercare: Secondary | ICD-10-CM

## 2014-05-21 DIAGNOSIS — O9089 Other complications of the puerperium, not elsewhere classified: Secondary | ICD-10-CM | POA: Insufficient documentation

## 2014-05-21 NOTE — Discharge Instructions (Signed)

## 2014-05-21 NOTE — MAU Note (Signed)
Pt presents stating that her wound vac stopped working around 1700. Pt has wound vac for c/s incsion. Pt having soreness at site. Denies abnormal vaginal bleeding.

## 2014-05-21 NOTE — MAU Provider Note (Signed)
  History     CSN: [id number]  Arrival date and time: 05/21/14 2106   First Provider Initiated Contact with Patient 05/21/14 2240      Chief Complaint  Patient presents with  . Wound Vac Broken    HPI  April [name] is a 28 y.o.  G1P1001 who is S/P c-section x 1 week. She states that her wound vac stopped working today. She doesn't have FU appointment until January. She states that she has not seen the smart start nurse, and was not given any instructions on wound vac care.   Past Medical History  Diagnosis Date  . Gall stones     during 1st trimester of pregnancy  . Diabetes mellitus without complication     Past Surgical History  Procedure Laterality Date  . No past surgeries    . Cesarean section N/A 05/14/2014    Procedure: CESAREAN SECTION;  Surgeon: Tereso NewcomerUgonna A Anyanwu, MD;  Location: [facility];  Service: Obstetrics;  Laterality: N/A;    Family History  Problem Relation Age of Onset  . Hypertension Mother     History  Substance Use Topics  . Smoking status: Never Smoker   . Smokeless tobacco: Not on file  . Alcohol Use: No    Allergies: No Known Allergies  Prescriptions prior to admission  Medication Sig Dispense Refill Last Dose  . calcium carbonate (TUMS EX) 750 MG chewable tablet Chew 1 tablet by mouth daily as needed for heartburn.    Past Week at Unknown time  . oxyCODONE-acetaminophen (ROXICET) 5-325 MG per tablet Take 1 tablet by mouth every 4 (four) hours as needed for severe pain. 50 tablet 0 Past Week at Unknown time  . Prenatal Vit-Fe Fumarate-FA (PRENATAL MULTIVITAMIN) TABS tablet Take 1 tablet by mouth daily.    Past Week at Unknown time    ROS Physical Exam   Blood pressure 133/82, pulse 90, temperature 98 F (36.7 C), temperature source Oral, resp. rate 18, height 5\' 2"  (1.575 m), weight 102.059 kg (225 lb), unknown if currently breastfeeding.  Physical Exam  Nursing note and vitals reviewed. Constitutional: She is oriented to person,  place, and time. She appears well-developed and well-nourished. No distress.  Cardiovascular: Normal rate.   GI: Soft.  Wound vac removed without difficulty. c-section incision: well healed, clean and dry.   Neurological: She is alert and oriented to person, place, and time.  Skin: Skin is warm and dry.  Psychiatric: She has a normal mood and affect.    MAU Course  Procedures   Assessment and Plan   1. Encounter for postoperative wound care    Wound vac removed today Reviewed post-op care Return to MAU as needed  Follow-up Information    [facility]   Specialty:  Obstetrics and Gynecology   Why:  As scheduled   Contact information:   [address] [place] [id number] [phone]       Tawnya CrookHogan, Manley Fason Donovan 05/21/2014, 10:46 PM

## 2014-05-21 NOTE — MAU Note (Signed)
Pt. Here to have wound vac assessed. Does not have follow up scheduled with MD. April [name] was born one week ago today via c-section. Pt. States she is having pain at incision site. Denies any abnormal bleeding.

## 2014-05-25 ENCOUNTER — Ambulatory Visit (HOSPITAL_COMMUNITY): Admit: 2014-05-25 | Payer: MEDICAID

## 2014-05-27 ENCOUNTER — Emergency Department (HOSPITAL_COMMUNITY): Payer: Medicaid - Out of State

## 2014-05-27 ENCOUNTER — Inpatient Hospital Stay (HOSPITAL_COMMUNITY)
Admission: EM | Admit: 2014-05-27 | Discharge: 2014-05-30 | DRG: 776 | Disposition: A | Discharge status: Home / Self Care | Payer: Medicaid - Out of State | Attending: Family Medicine | Admitting: Family Medicine

## 2014-05-27 ENCOUNTER — Encounter (HOSPITAL_COMMUNITY): Payer: Self-pay

## 2014-05-27 DIAGNOSIS — R509 Fever, unspecified: Secondary | ICD-10-CM

## 2014-05-27 DIAGNOSIS — N719 Inflammatory disease of uterus, unspecified: Secondary | ICD-10-CM | POA: Diagnosis present

## 2014-05-27 DIAGNOSIS — O909 Complication of the puerperium, unspecified: Secondary | ICD-10-CM | POA: Diagnosis present

## 2014-05-27 DIAGNOSIS — O8612 Endometritis following delivery: Principal | ICD-10-CM | POA: Diagnosis present

## 2014-05-27 DIAGNOSIS — E119 Type 2 diabetes mellitus without complications: Secondary | ICD-10-CM | POA: Diagnosis present

## 2014-05-27 HISTORY — DX: Gestational diabetes mellitus in pregnancy, unspecified control: O24.419

## 2014-05-27 LAB — CBC WITH DIFFERENTIAL/PLATELET
Basophils Absolute: 0 10*3/uL (ref 0.0–0.1)
Basophils Relative: 0 % (ref 0–1)
Eosinophils Absolute: 0.2 10*3/uL (ref 0.0–0.7)
Eosinophils Relative: 2 % (ref 0–5)
HCT: 33.1 % — ABNORMAL LOW (ref 36.0–46.0)
Hemoglobin: 11.6 g/dL — ABNORMAL LOW (ref 12.0–15.0)
Lymphocytes Relative: 13 % (ref 12–46)
Lymphs Abs: 1.5 10*3/uL (ref 0.7–4.0)
MCH: 32.4 pg (ref 26.0–34.0)
MCHC: 35 g/dL (ref 30.0–36.0)
MCV: 92.5 fL (ref 78.0–100.0)
Monocytes Absolute: 0.6 10*3/uL (ref 0.1–1.0)
Monocytes Relative: 6 % (ref 3–12)
Neutro Abs: 8.8 10*3/uL — ABNORMAL HIGH (ref 1.7–7.7)
Neutrophils Relative %: 79 % — ABNORMAL HIGH (ref 43–77)
Platelets: 256 10*3/uL (ref 150–400)
RBC: 3.58 MIL/uL — ABNORMAL LOW (ref 3.87–5.11)
RDW: 12.9 % (ref 11.5–15.5)
WBC: 11 10*3/uL — ABNORMAL HIGH (ref 4.0–10.5)

## 2014-05-27 LAB — I-STAT CG4 LACTIC ACID, ED: Lactic Acid, Venous: 1.67 mmol/L (ref 0.5–2.2)

## 2014-05-27 LAB — URINALYSIS, ROUTINE W REFLEX MICROSCOPIC
Bilirubin Urine: NEGATIVE
Glucose, UA: NEGATIVE mg/dL
Ketones, ur: NEGATIVE mg/dL
Nitrite: NEGATIVE
Protein, ur: NEGATIVE mg/dL
Specific Gravity, Urine: 1.01 (ref 1.005–1.030)
Urobilinogen, UA: 0.2 mg/dL (ref 0.0–1.0)
pH: 6 (ref 5.0–8.0)

## 2014-05-27 LAB — COMPREHENSIVE METABOLIC PANEL
ALT: 24 U/L (ref 0–35)
AST: 21 U/L (ref 0–37)
Albumin: 3.4 g/dL — ABNORMAL LOW (ref 3.5–5.2)
Alkaline Phosphatase: 136 U/L — ABNORMAL HIGH (ref 39–117)
Anion gap: 15 (ref 5–15)
BUN: 11 mg/dL (ref 6–23)
CO2: 22 mEq/L (ref 19–32)
Calcium: 8.9 mg/dL (ref 8.4–10.5)
Chloride: 100 mEq/L (ref 96–112)
Creatinine, Ser: 0.9 mg/dL (ref 0.50–1.10)
GFR calc Af Amer: >90 mL/min (ref >90)
GFR calc non Af Amer: 86 mL/min — ABNORMAL LOW (ref >90)
Glucose, Bld: 133 mg/dL — ABNORMAL HIGH (ref 70–99)
Potassium: 4 mEq/L (ref 3.7–5.3)
Sodium: 137 mEq/L (ref 137–147)
Total Bilirubin: 0.2 mg/dL — ABNORMAL LOW (ref 0.3–1.2)
Total Protein: 7.6 g/dL (ref 6.0–8.3)

## 2014-05-27 LAB — WET PREP, GENITAL
Clue Cells Wet Prep HPF POC: NONE SEEN
Trich, Wet Prep: NONE SEEN
Yeast Wet Prep HPF POC: NONE SEEN

## 2014-05-27 LAB — URINE MICROSCOPIC-ADD ON

## 2014-05-27 LAB — PREGNANCY, URINE: Preg Test, Ur: NEGATIVE

## 2014-05-27 LAB — RAPID STREP SCREEN (MED CTR MEBANE ONLY): Streptococcus, Group A Screen (Direct): NEGATIVE

## 2014-05-27 MED ORDER — GENTAMICIN SULFATE 40 MG/ML IJ SOLN
INTRAMUSCULAR | Status: AC
  Filled 2014-05-27: qty 6

## 2014-05-27 MED ORDER — GENTAMICIN SULFATE 40 MG/ML IJ SOLN
170.0000 mg | Freq: Once | INTRAVENOUS | Status: AC
  Administered 2014-05-27: 170 mg via INTRAVENOUS
  Filled 2014-05-27: qty 4.25

## 2014-05-27 MED ORDER — MORPHINE SULFATE 4 MG/ML IJ SOLN
4.0000 mg | Freq: Once | INTRAMUSCULAR | Status: AC
Start: 2014-05-27 — End: 2014-05-27

## 2014-05-27 MED ORDER — CLINDAMYCIN PHOSPHATE 600 MG/50ML IV SOLN
600.0000 mg | Freq: Once | INTRAVENOUS | Status: AC
  Administered 2014-05-27: 600 mg via INTRAVENOUS
  Filled 2014-05-27: qty 50

## 2014-05-27 MED ORDER — ACETAMINOPHEN 325 MG PO TABS
ORAL_TABLET | ORAL | Status: AC
  Filled 2014-05-27: qty 2

## 2014-05-27 MED ORDER — ONDANSETRON HCL 4 MG/2ML IJ SOLN
4.0000 mg | Freq: Once | INTRAMUSCULAR | Status: AC
  Administered 2014-05-27: 4 mg via INTRAVENOUS
  Filled 2014-05-27: qty 2

## 2014-05-27 MED ORDER — ACETAMINOPHEN 325 MG PO TABS
650.0000 mg | ORAL_TABLET | Freq: Once | ORAL | Status: AC
  Administered 2014-05-27: 650 mg via ORAL

## 2014-05-27 MED ORDER — MORPHINE SULFATE 10 MG/ML IJ SOLN
INTRAMUSCULAR | Status: AC
Start: 2014-05-27 — End: 2014-05-27
  Administered 2014-05-27: 4 mg
  Filled 2014-05-27: qty 1

## 2014-05-27 MED ORDER — ACETAMINOPHEN 325 MG PO TABS: 650.0000 mg | ORAL_TABLET | Freq: Once | ORAL | Status: AC

## 2014-05-27 MED ORDER — GENTAMICIN IN SALINE 1.6-0.9 MG/ML-% IV SOLN
INTRAVENOUS | Status: AC
  Filled 2014-05-27: qty 50

## 2014-05-27 MED ORDER — SODIUM CHLORIDE 0.9 % IV BOLUS (SEPSIS)
1000.0000 mL | Freq: Once | INTRAVENOUS | Status: AC
  Administered 2014-05-27: 1000 mL via INTRAVENOUS

## 2014-05-27 NOTE — ED Provider Notes (Signed)
CSN: [id number]     Arrival date & time 05/27/14  2007 History  This chart was scribed for Glynn Octave, MD by Modena Jansky, ED Scribe. This patient was seen in room APA07/APA07 and the patient's care was started at 8:40 PM.   Chief Complaint  Patient presents with  . Fever  . Wound Infection   The history is provided by the patient. No language interpreter was used.   HPI Comments: April [name] is a 28 y.o. female who presents to the Emergency Department complaining of a constant moderate fever that started about 9 hours ago. She states that she had a C-section two weeks ago, and started having some warmth so she checked her temperature. She reports that her temperature was 102.9. Pt's temperature in the ED today was 102.9. She reports no treatment PTA. She states that she has pain in the incision area with some redness without any bleeding or discharge. She reports that she gave birth 2 weeks ago. She states that she had some mild SOB and headache. She reports that she has a hx of gestational DM. She denies any chest pain, cough, or sore throat.   Past Medical History  Diagnosis Date  . Gall stones     during 1st trimester of pregnancy  . Diabetes mellitus without complication   . Gestational diabetes    Past Surgical History  Procedure Laterality Date  . No past surgeries    . Cesarean section N/A 05/14/2014    Procedure: CESAREAN SECTION;  Surgeon: Tereso Newcomer, MD;  Location: [facility];  Service: Obstetrics;  Laterality: N/A;   Family History  Problem Relation Age of Onset  . Hypertension Mother    History  Substance Use Topics  . Smoking status: Never Smoker   . Smokeless tobacco: Never Used  . Alcohol Use: No   OB History    Gravida Para Term Preterm AB TAB SAB Ectopic Multiple Living   1 1 1       0 1     Review of Systems A complete 10 system review of systems was obtained and all systems are negative except as noted in the HPI and PMH.   Allergies   Review of patient's allergies indicates no known allergies.  Home Medications   Prior to Admission medications   Medication Sig Start Date End Date Taking? Authorizing Provider  oxyCODONE-acetaminophen (ROXICET) 5-325 MG per tablet Take 1 tablet by mouth every 4 (four) hours as needed for severe pain. 05/17/14  Yes Perry Mount, MD   BP 145/69 mmHg  Pulse 121  Temp(Src) 102.9 F (39.4 C)  Resp 22  Ht 5\' 2"  (1.575 m)  Wt 215 lb 3 oz (97.608 kg)  BMI 39.35 kg/m2  SpO2 99%  Breastfeeding? Yes Physical Exam  Constitutional: She is oriented to person, place, and time. She appears well-developed and well-nourished. No distress.  HENT:  Head: Normocephalic and atraumatic.  Mouth/Throat: Oropharynx is clear and moist. No oropharyngeal exudate.  Eyes: Conjunctivae and EOM are normal. Pupils are equal, round, and reactive to light.  Neck: Normal range of motion. Neck supple.  No meningismus.  Cardiovascular: Regular rhythm, normal heart sounds and intact distal pulses.   No murmur heard. Tachycardic.  Pulmonary/Chest: Effort normal and breath sounds normal. No respiratory distress.  Abdominal: Soft. There is no tenderness. There is no rebound and no guarding.  Obese and soft. C cection incision has erythema with scant white drainage. No CVA tenderness.   Genitourinary: Vaginal discharge  found.  Chaperone present. Normal external genitalia. Brown discharge in vaginal vault. No CMT. tenderness of uterus. No adnexal tenderness  Musculoskeletal: Normal range of motion. She exhibits no edema or tenderness.  Neurological: She is alert and oriented to person, place, and time. No cranial nerve deficit. She exhibits normal muscle tone. Coordination normal.  No ataxia on finger to nose bilaterally. No pronator drift. 5/5 strength throughout. CN 2-12 intact. Negative Romberg. Equal grip strength. Sensation intact. Gait is normal.   Skin: Skin is warm.  Psychiatric: She has a normal mood and  affect. Her behavior is normal.  Nursing note and vitals reviewed.   ED Course  Procedures (including critical care time) DIAGNOSTIC STUDIES: Oxygen Saturation is 99% on RA, normal by my interpretation.    COORDINATION OF CARE: 8:44 PM- Pt advised of plan for treatment which includes medication, radiology, and labs and pt agrees.  Labs Review Labs Reviewed  WET PREP, GENITAL - Abnormal; Notable for the following:    WBC, Wet Prep HPF POC TOO NUMEROUS TO COUNT (*)    All other components within normal limits  URINALYSIS, ROUTINE W REFLEX MICROSCOPIC - Abnormal; Notable for the following:    Hgb urine dipstick LARGE (*)    Leukocytes, UA LARGE (*)    All other components within normal limits  CBC WITH DIFFERENTIAL - Abnormal; Notable for the following:    WBC 11.0 (*)    RBC 3.58 (*)    Hemoglobin 11.6 (*)    HCT 33.1 (*)    Neutrophils Relative % 79 (*)    Neutro Abs 8.8 (*)    All other components within normal limits  COMPREHENSIVE METABOLIC PANEL - Abnormal; Notable for the following:    Glucose, Bld 133 (*)    Albumin 3.4 (*)    Alkaline Phosphatase 136 (*)    Total Bilirubin 0.2 (*)    GFR calc non Af Amer 86 (*)    All other components within normal limits  URINE MICROSCOPIC-ADD ON - Abnormal; Notable for the following:    Bacteria, UA FEW (*)    All other components within normal limits  RAPID STREP SCREEN  GC/CHLAMYDIA PROBE AMP  CULTURE, BLOOD (ROUTINE X 2)  CULTURE, BLOOD (ROUTINE X 2)  CULTURE, GROUP A STREP  PREGNANCY, URINE  I-STAT CG4 LACTIC ACID, ED    Imaging Review Dg Chest 2 View  05/27/2014   CLINICAL DATA:  Fever and Caesarean section wound infection.  EXAM: CHEST  2 VIEW  COMPARISON:  None.  FINDINGS: Cardiomediastinal silhouette is unremarkable. The lungs are clear without pleural effusions or focal consolidations. Trachea projects midline and there is no pneumothorax. Soft tissue planes and included osseous structures are non-suspicious.   IMPRESSION: No acute cardiopulmonary process ; normal chest radiograph.   Electronically Signed   By: Awilda Metroourtnay  Bloomer   On: 05/27/2014 22:20     EKG Interpretation None      MDM   Final diagnoses:  Fever  Endometritis   Patient with fever, pain along C-section incision, nausea since 3 PM. Denies any vaginal discharge or bleeding.  Patient with tachycardia, fever, cellulitis along C-section incision. Pelvic exam shows vaginal discharge with uterine tenderness. Concern for endometritis.  Blood culture sent. WBC 11.  Lactate normal. UA appears infected. IVF and antibiotics given.  Case discussed with Dr. Shawnie PonsPratt of obstetrics. Concern for endometritis. Patient will be started on clindamycin and gentamicin. [facility] Shawnie PonsPratt states [facility]   BP 106/61 mmHg  Pulse 100  Temp(Src) 99 F (37.2 C) (Oral)  Resp 18  Ht 5\' 2"  (1.575 m)  Wt 215 lb 3 oz (97.608 kg)  BMI 39.35 kg/m2  SpO2 98%  LMP   Breastfeeding? Yes   CRITICAL CARE Performed by: Glynn OctaveANCOUR, Aidian Salomon Total critical care time: 30 Critical care time was exclusive of separately billable procedures and treating other patients. Critical care was necessary to treat or prevent imminent or life-threatening deterioration. Critical care was time spent personally by me on the following activities: development of treatment plan with patient and/or surrogate as well as nursing, discussions with consultants, evaluation of patient's response to treatment, examination of patient, obtaining history from patient or surrogate, ordering and performing treatments and interventions, ordering and review of laboratory studies, ordering and review of radiographic studies, pulse oximetry and re-evaluation of patient's condition.   I personally performed the services described in this documentation, which was scribed in my presence. The recorded information has been reviewed and is  accurate.   Glynn OctaveStephen Rashawn Rolon, MD 05/28/14 [id number]

## 2014-05-27 NOTE — ED Notes (Signed)
I had a c-section 2 weeks ago. Today I am having a fever, chills, and pain in the incision area per pt.

## 2014-05-28 ENCOUNTER — Encounter (HOSPITAL_COMMUNITY): Payer: Self-pay

## 2014-05-28 ENCOUNTER — Inpatient Hospital Stay (HOSPITAL_COMMUNITY): Payer: Medicaid - Out of State

## 2014-05-28 DIAGNOSIS — O8612 Endometritis following delivery: Secondary | ICD-10-CM | POA: Diagnosis present

## 2014-05-28 DIAGNOSIS — N719 Inflammatory disease of uterus, unspecified: Secondary | ICD-10-CM

## 2014-05-28 DIAGNOSIS — R109 Unspecified abdominal pain: Secondary | ICD-10-CM | POA: Diagnosis present

## 2014-05-28 DIAGNOSIS — E119 Type 2 diabetes mellitus without complications: Secondary | ICD-10-CM | POA: Diagnosis present

## 2014-05-28 DIAGNOSIS — O909 Complication of the puerperium, unspecified: Secondary | ICD-10-CM | POA: Diagnosis present

## 2014-05-28 MED ORDER — BUTALBITAL-APAP-CAFFEINE 50-325-40 MG PO TABS
2.0000 | ORAL_TABLET | Freq: Once | ORAL | Status: AC
  Administered 2014-05-28: 2 via ORAL
  Filled 2014-05-28: qty 2

## 2014-05-28 MED ORDER — HYDROCODONE-ACETAMINOPHEN 5-325 MG PO TABS
1.0000 | ORAL_TABLET | ORAL | Status: DC | PRN
  Administered 2014-05-28 – 2014-05-29 (×2): 2 via ORAL
  Administered 2014-05-29: 1 via ORAL
  Filled 2014-05-28: qty 2
  Filled 2014-05-28: qty 1
  Filled 2014-05-28: qty 2
  Filled 2014-05-28: qty 1

## 2014-05-28 MED ORDER — HYDROMORPHONE HCL 1 MG/ML IJ SOLN
0.2000 mg | INTRAMUSCULAR | Status: DC | PRN
  Administered 2014-05-28: 0.6 mg via INTRAVENOUS
  Administered 2014-05-28: 0.5 mg via INTRAVENOUS
  Administered 2014-05-28: 0.6 mg via INTRAVENOUS
  Filled 2014-05-28 (×3): qty 1

## 2014-05-28 MED ORDER — IBUPROFEN 100 MG/5ML PO SUSP
600.0000 mg | Freq: Four times a day (QID) | ORAL | Status: DC | PRN
  Administered 2014-05-28 – 2014-05-29 (×4): 600 mg via ORAL
  Filled 2014-05-28 (×5): qty 30

## 2014-05-28 MED ORDER — MENTHOL 3 MG MT LOZG: 1.0000 | LOZENGE | OROMUCOSAL | Status: DC | PRN

## 2014-05-28 MED ORDER — DOCUSATE SODIUM 100 MG PO CAPS
100.0000 mg | ORAL_CAPSULE | Freq: Two times a day (BID) | ORAL | Status: DC
  Administered 2014-05-28 – 2014-05-30 (×5): 100 mg via ORAL
  Filled 2014-05-28 (×5): qty 1

## 2014-05-28 MED ORDER — CLINDAMYCIN PHOSPHATE 900 MG/50ML IV SOLN
900.0000 mg | Freq: Three times a day (TID) | INTRAVENOUS | Status: DC
  Administered 2014-05-28 – 2014-05-29 (×4): 900 mg via INTRAVENOUS
  Filled 2014-05-28 (×5): qty 50

## 2014-05-28 MED ORDER — IBUPROFEN 600 MG PO TABS: 600.0000 mg | ORAL_TABLET | Freq: Four times a day (QID) | ORAL | Status: DC | PRN

## 2014-05-28 MED ORDER — ALUM & MAG HYDROXIDE-SIMETH 200-200-20 MG/5ML PO SUSP: 30.0000 mL | ORAL | Status: DC | PRN

## 2014-05-28 MED ORDER — GUAIFENESIN 100 MG/5ML PO SOLN: 15.0000 mL | ORAL | Status: DC | PRN

## 2014-05-28 MED ORDER — GENTAMICIN SULFATE 40 MG/ML IJ SOLN
150.0000 mg | Freq: Two times a day (BID) | INTRAVENOUS | Status: DC
  Administered 2014-05-28 (×2): 150 mg via INTRAVENOUS
  Filled 2014-05-28 (×3): qty 3.75

## 2014-05-28 MED ORDER — DEXTROSE IN LACTATED RINGERS 5 % IV SOLN
INTRAVENOUS | Status: DC
  Administered 2014-05-28 – 2014-05-30 (×7): via INTRAVENOUS

## 2014-05-28 NOTE — MAU Note (Signed)
Received [facility] for c/os of abd pain at "incision" and fever. Postpartum C/S. Delivered 05/14/2014.

## 2014-05-28 NOTE — Progress Notes (Signed)
ANTIBIOTIC CONSULT NOTE - INITIAL  Pharmacy Consult for gentamicin Indication: endometritis  No Known Allergies  Patient Measurements: Height: 5\' 2"  (157.5 cm) Weight: 215 lb 3 oz (97.608 kg) IBW/kg (Calculated) : 50.1 Adjusted Body Weight: 65 kg  Vital Signs: Temp: 99.5 F (37.5 C) (12/13 0207) Temp Source: Oral (12/13 0207) BP: 111/62 mmHg (12/13 0207) Pulse Rate: 94 (12/13 0207) Intake/Output from previous day:   Intake/Output from this shift:    Labs:  Recent Labs  05/27/14 2100  WBC 11.0*  HGB 11.6*  PLT 256  CREATININE 0.90   Estimated CrCl = 75-6680ml/min with SCr= 0.9  (presumed dehydration secondary to fever/infection).   Microbiology: Recent Results (from the past 720 hour(s))  Rapid strep screen     Status: None   Collection Time: 05/27/14  9:07 PM  Result Value Ref Range Status   Streptococcus, Group A Screen (Direct) NEGATIVE NEGATIVE Final    Comment: (NOTE) A Rapid Antigen test may result negative if the antigen level in the sample is below the detection level of this test. The FDA has not cleared this test as a stand-alone test therefore the rapid antigen negative result has reflexed to a Group A Strep culture.   Wet prep, genital     Status: Abnormal   Collection Time: 05/27/14  9:32 PM  Result Value Ref Range Status   Yeast Wet Prep HPF POC NONE SEEN NONE SEEN Final   Trich, Wet Prep NONE SEEN NONE SEEN Final   Clue Cells Wet Prep HPF POC NONE SEEN NONE SEEN Final   WBC, Wet Prep HPF POC TOO NUMEROUS TO COUNT (A) NONE SEEN Final    Medical History: Past Medical History  Diagnosis Date  . Gall stones     during 1st trimester of pregnancy  . Diabetes mellitus without complication   . Gestational diabetes     Medications:  Scheduled:  . clindamycin (CLEOCIN) IV  900 mg Intravenous 3 times per day  . docusate sodium  100 mg Oral BID  . gentamicin  150 mg Intravenous Q12H   Infusions:  . dextrose 5% lactated ringers 125 mL/hr at  05/28/14 0209   PRN: alum & mag hydroxide-simeth, guaiFENesin, HYDROcodone-acetaminophen, HYDROmorphone (DILAUDID) injection, ibuprofen, menthol-cetylpyridinium   Assessment: [age]yr G1P1  S/P C-section 2 wks ago.  Patient now with infection presumed to be endometritis.  Goal of Therapy:  Desire gentamicin serum peak 6-108mcg/ml and trough <361mcg/ml.  Plan:  1.  Clindamycin 600mg  IV x 1 dose given at [facility]-ED @ 2230 and gentamicin 170mg  IV x 1 dose given at [facility] @ 2300  Last night. 2.  Clindamycin 900mg  IV q8h started this morning 3.  Gentamicin maintenance regimen 150mg  IV q12h to be started later this morning 4.  Will monitor for indices of infection and renal function 5.  Will measure actual steady state serum gentamicin levels if Tx continues >48-72hr or as clinically indicated  Lorilynn Lehr E 05/28/2014,2:53 AM

## 2014-05-28 NOTE — H&P (Signed)
Chief Complaint  Patient presents with  . Fever  . Wound Infection   The history is provided by the patient. No language interpreter was used.   HPI Comments: April [name] is a 28 y.o. female who presents to the Emergency Department complaining of a constant moderate fever that started about 9 hours ago. She states that she had a C-section two weeks ago, and started having some warmth so she checked her temperature. She reports that her temperature was 102.9. Pt's temperature in the ED today was 102.9. She reports no treatment PTA. She states that she has pain in the incision area with some redness without any bleeding or discharge. She reports that she gave birth 2 weeks ago. She states that she had some mild SOB and headache. She reports that she has a hx of gestational DM. She denies any chest pain, cough, or sore throat.   Past Medical History  Diagnosis Date  . Gall stones     during 1st trimester of pregnancy  . Diabetes mellitus without complication   . Gestational diabetes    Past Surgical History  Procedure Laterality Date  . No past surgeries    . Cesarean section N/A 05/14/2014    Procedure: CESAREAN SECTION; Surgeon: Tereso NewcomerUgonna A Anyanwu, MD; Location: [facility]; Service: Obstetrics; Laterality: N/A;   Family History  Problem Relation Age of Onset  . Hypertension Mother    History  Substance Use Topics  . Smoking status: Never Smoker   . Smokeless tobacco: Never Used  . Alcohol Use: No   OB History    Gravida Para Term Preterm AB TAB SAB Ectopic Multiple Living   1 1 1       0 1     Review of Systems A complete 10 system review of systems was obtained and all systems are negative except as noted in the HPI and PMH.   Allergies  Review of patient's allergies indicates no known allergies.  Home Medications   Prior to Admission medications   Medication Sig Start Date  End Date Taking? Authorizing Provider  oxyCODONE-acetaminophen (ROXICET) 5-325 MG per tablet Take 1 tablet by mouth every 4 (four) hours as needed for severe pain. 05/17/14  Yes Perry MountKristy Rocio Acosta, MD   BP 145/69 mmHg  Pulse 121  Temp(Src) 102.9 F (39.4 C)  Resp 22  Ht 5\' 2"  (1.575 m)  Wt 215 lb 3 oz (97.608 kg)  BMI 39.35 kg/m2  SpO2 99%  Breastfeeding? Yes Physical Exam  Constitutional: She is oriented to person, place, and time. She appears well-developed and well-nourished. No distress.  HENT:  Head: Normocephalic and atraumatic.  Mouth/Throat: Oropharynx is clear and moist. No oropharyngeal exudate.  Eyes: Conjunctivae and EOM are normal. Pupils are equal, round, and reactive to light.  Neck: Normal range of motion. Neck supple.  No meningismus.  Cardiovascular: Regular rhythm, normal heart sounds and intact distal pulses.  No murmur heard. Tachycardic.  Pulmonary/Chest: Effort normal and breath sounds normal. No respiratory distress.  Abdominal: Soft. There is no tenderness. There is no rebound and no guarding.  Obese and soft. C cection incision has minimal erythema at central portion of incision with scant white drainage. No CVA tenderness.  Genitourinary: Vaginal discharge found.  Chaperone present. Normal external genitalia. Brown discharge in vaginal vault. No CMT. +Tenderness of uterus. No adnexal tenderness  Musculoskeletal: Normal range of motion. She exhibits no edema or tenderness.  Neurological: She is alert and oriented to person, place, and time. No cranial  nerve deficit. She exhibits normal muscle tone. Coordination normal.  No ataxia on finger to nose bilaterally. No pronator drift. 5/5 strength throughout. CN 2-12 intact. Negative Romberg. Equal grip strength. Sensation intact. Gait is normal.  Skin: Skin is warm.  Psychiatric: She has a normal mood and affect. Her behavior is normal.  Nursing note and vitals reviewed.  Labs WET PREP, GENITAL  - Abnormal; Notable for the following:    WBC, Wet Prep HPF POC TOO NUMEROUS TO COUNT (*)    All other components within normal limits  URINALYSIS, ROUTINE W REFLEX MICROSCOPIC - Abnormal; Notable for the following:    Hgb urine dipstick LARGE (*)    Leukocytes, UA LARGE (*)    All other components within normal limits  CBC WITH DIFFERENTIAL - Abnormal; Notable for the following:    WBC 11.0 (*)    RBC 3.58 (*)    Hemoglobin 11.6 (*)    HCT 33.1 (*)    Neutrophils Relative % 79 (*)    Neutro Abs 8.8 (*)    All other components within normal limits  COMPREHENSIVE METABOLIC PANEL - Abnormal; Notable for the following:    Glucose, Bld 133 (*)    Albumin 3.4 (*)    Alkaline Phosphatase 136 (*)    Total Bilirubin 0.2 (*)    GFR calc non Af Amer 86 (*)    All other components within normal limits  URINE MICROSCOPIC-ADD ON - Abnormal; Notable for the following:    Bacteria, UA FEW (*)    All other components within normal limits  RAPID STREP SCREEN  GC/CHLAMYDIA PROBE AMP  CULTURE, BLOOD (ROUTINE X 2)  CULTURE, BLOOD (ROUTINE X 2)  CULTURE, GROUP A STREP  PREGNANCY, URINE  I-STAT CG4 LACTIC ACID, ED    Imaging     Dg Chest 2 View  05/27/2014 CLINICAL DATA: Fever and Caesarean section wound infection. EXAM: CHEST 2 VIEW COMPARISON: None. FINDINGS: Cardiomediastinal silhouette is unremarkable. The lungs are clear without pleural effusions or focal consolidations. Trachea projects midline and there is no pneumothorax. Soft tissue planes and included osseous structures are non-suspicious. IMPRESSION: No acute cardiopulmonary process ; normal chest radiograph. Electronically Signed By: Awilda Metroourtnay Bloomer On: 05/27/2014 22:20     Assessment: Principal Problem:   Puerperal endometritis, delivered, with postpartum complication   Plan: U/S to rule out retained POC Antibiotics IV Needs  breast pump and lactation consultant

## 2014-05-28 NOTE — Progress Notes (Signed)
Pt verbalized she is not ready to pump yet, received pain med and wants to rest.  Noted to be leaking milk intermittently.  Helped her put her bra on and cloths in bra to help with leaking. Baby is staying with her mother.   Breast pump and kit placed in Women's unit room at bedside and let pt know we can assist her when she is ready.  Reports she is familiar with pump as she used it when she delivered couple weeks ago.

## 2014-05-28 NOTE — Progress Notes (Signed)
UR completed 

## 2014-05-29 MED ORDER — PIPERACILLIN-TAZOBACTAM 3.375 G IVPB 30 MIN: 3.3750 g | Freq: Three times a day (TID) | INTRAVENOUS | Status: DC

## 2014-05-29 MED ORDER — PIPERACILLIN-TAZOBACTAM 3.375 G IVPB
3.3750 g | Freq: Three times a day (TID) | INTRAVENOUS | Status: DC
  Administered 2014-05-29 – 2014-05-30 (×4): 3.375 g via INTRAVENOUS
  Filled 2014-05-29 (×6): qty 50

## 2014-05-29 NOTE — Progress Notes (Signed)
Post Partum Day 15 Subjective: Slight improvement in her abdominal pain  Objective: Blood pressure 110/64, pulse 67, temperature 98.1 F (36.7 C), temperature source Oral, resp. rate 15, height 5\' 2"  (1.575 m), weight 215 lb 3 oz (97.608 kg), SpO2 99 %, currently breastfeeding.  Physical Exam:  General: alert, cooperative and no distress Lochia: appropriate Uterine Fundus: firm with mild tenderness Incision: healing well, no significant drainage, no significant erythema DVT Evaluation: Negative Homan's sign. No cords or calf tenderness.   Recent Labs  05/27/14 2100  HGB 11.6*  HCT 33.1*    Assessment/Plan: 28 yo G1P1 POD#15 s/p c-section admitted with endometritis Patient with Tmax 100.7 at 1am  Continue IV antibiotics Continue current care   LOS: 2 days   April [name] 05/29/2014, 7:16 AM

## 2014-05-29 NOTE — Plan of Care (Signed)
Problem: Phase II Progression Outcomes Goal: Progress activity as tolerated unless otherwise ordered Outcome: Completed/Met Date Met:  05/29/14 Tolerates ambulating well.

## 2014-05-30 DIAGNOSIS — N719 Inflammatory disease of uterus, unspecified: Secondary | ICD-10-CM

## 2014-05-30 DIAGNOSIS — O909 Complication of the puerperium, unspecified: Secondary | ICD-10-CM

## 2014-05-30 DIAGNOSIS — E119 Type 2 diabetes mellitus without complications: Secondary | ICD-10-CM

## 2014-05-30 DIAGNOSIS — O8612 Endometritis following delivery: Secondary | ICD-10-CM

## 2014-05-30 LAB — GC/CHLAMYDIA PROBE AMP
CT Probe RNA: INVALID
GC Probe RNA: INVALID

## 2014-05-30 LAB — CULTURE, GROUP A STREP

## 2014-05-30 NOTE — Discharge Summary (Signed)
Physician Discharge Summary  Patient ID: April [name] MRN: [id number] DOB/AGE: 28/09/1985 28 y.o.  Admit date: 05/27/2014 Discharge date: 05/30/2014  Admission Diagnoses:  Discharge Diagnoses:  Principal Problem:   Puerperal endometritis, delivered, with postpartum complication Active Problems:   Postpartum endometritis   Discharged Condition: good  Hospital Course: admitted POD#14 s/p LTCS after prolonged induction 2/2 A2DM.  Presented to OSH with fevers/abdominal pain.  Started on gent and clinda, subsequently transitioned to zosyn after remained afebrile.  Last fever around 0100 yesterday morning, has remained afebrile since currently >24h.  Pain well controlled.  Consults: None  Significant Diagnostic Studies:   Ultrasound: Mild postpartum uterine enlargement. Small amount of simple endometrial fluid which is nonspecific and could be seen with early endometritis.  Treatments: IV hydration  Discharge Exam: Blood pressure 107/65, pulse 56, temperature 97.5 F (36.4 C), temperature source Oral, resp. rate 18, height 5\' 2"  (1.575 m), weight 221 lb 0.7 oz (100.265 kg), SpO2 99 %, currently breastfeeding. General appearance: alert, cooperative and no distress Cardio: reguar rate GI: soft, non-tender; bowel sounds normal; no masses,  no organomegaly Extremities: extremities normal, atraumatic, no cyanosis or edema Pulses: 2+ and symmetric Skin: Skin color, texture, turgor normal. No rashes or lesions Incision/Wound: lower transverse without drainage, middle incision with <0.5cm minimal wound dehiscence, unable to probe with q tip, suture visible.  No fluctuance, no induration, overall healing well  Disposition: 01-Home or Self Care      Discharge Instructions    Call MD for:  redness, tenderness, or signs of infection (pain, swelling, redness, odor or green/yellow discharge around incision site)    Complete by:  As directed      Call MD for:  severe uncontrolled pain     Complete by:  As directed      Call MD for:  temperature >100.4    Complete by:  As directed      Diet - low sodium heart healthy    Complete by:  As directed             Medication List    TAKE these medications        oxyCODONE-acetaminophen 5-325 MG per tablet  Commonly known as:  ROXICET  Take 1 tablet by mouth every 4 (four) hours as needed for severe pain.       Follow-up Information    Follow up with [facility] In 1 week.   Why:  for follow up      Signed: [name] 05/30/2014, 10:30 AM

## 2014-05-30 NOTE — Progress Notes (Signed)
Pt out in wheelchair  Teaching complete   

## 2014-05-30 NOTE — Discharge Instructions (Signed)
Endometritis Endometritis is an irritation, soreness, and swelling (inflammation) of the lining of the uterus (endometrium).  CAUSES   Bacterial infections.  Sexually transmitted infections (STIs).  Having a miscarriage or childbirth, especially after a long labor or cesarean delivery.  Certain gynecological procedures (such as dilation and curettage, hysteroscopy, or contraceptive insertion). SIGNS AND SYMPTOMS   Fever.  Lower abdominal or pelvic pain.  Abnormal vaginal discharge or bleeding.  Abdominal bloating (distention) or swelling.  General discomfort or ill feeling.  Discomfort with bowel movements. DIAGNOSIS  A physical and pelvic exam are performed. Other tests may include:  Cultures from the cervix.  Blood tests.  Examining a tissue sample of the uterine lining (endometrial biopsy).  Examining discharge under a microscope (wet prep).  Laparoscopy. TREATMENT  Antibiotic medicines are usually given. Other treatments may include:  Fluids through an IV tube inserted in your vein.  Rest. HOME CARE INSTRUCTIONS   Take over-the-counter or prescription medicines for pain, discomfort, or fever as directed by your health care provider.  Take your antibiotics as directed. Finish them even if you start to feel better.  Resume your normal diet and activities as directed or as tolerated.  Do not douche or have sexual intercourse until your health care provider says it is okay.  Do not have sexual intercourse until your partner has been treated if your endometritis is caused by an STI. SEEK IMMEDIATE MEDICAL CARE IF:   You have swelling or increasing pain in the abdomen.  You have a fever.  You have bad smelling vaginal discharge, or you have an increased amount of discharge.  You have abnormal vaginal bleeding.  Your medicine is not helping with the pain.  You experience any problems that may be related to the medicine you are taking.  You have nausea  and vomiting, or you cannot keep foods down.  You have pain with bowel movements. MAKE SURE YOU:   Understand these instructions.  Will watch your condition.  Will get help right away if you are not doing well or get worse. Document Released: 05/27/2001 Document Revised: 02/02/2013 Document Reviewed: 12/30/2012 ExitCare Patient Information 2015 [employer]. This information is not intended to replace advice given to you by your health care provider. Make sure you discuss any questions you have with your health care provider.  

## 2014-06-01 LAB — CULTURE, BLOOD (ROUTINE X 2): Culture: NO GROWTH

## 2014-06-03 LAB — CULTURE, BLOOD (ROUTINE X 2): Culture: NO GROWTH

## 2014-06-23 ENCOUNTER — Ambulatory Visit: Payer: Self-pay | Admitting: Nurse Practitioner

## 2014-06-23 ENCOUNTER — Telehealth: Payer: Self-pay

## 2014-06-23 NOTE — Telephone Encounter (Signed)
Patient missed PP visit today. Attempted to contact patient. No answer. Left message stating we are sorry you missed your appointment, please call clinic to reschedule.

## 2014-07-21 ENCOUNTER — Encounter: Payer: Self-pay | Admitting: General Practice

## 2014-08-17 ENCOUNTER — Encounter: Payer: Self-pay | Admitting: Obstetrics & Gynecology

## 2015-12-26 IMAGING — CR DG CHEST 2V
2 series · 2 of 2 positions shown · non-contrast
Comparison: None.

CLINICAL DATA: Fever and Caesarean section wound infection.

EXAM:
CHEST  2 VIEW

[view not recorded (1 of 2)]
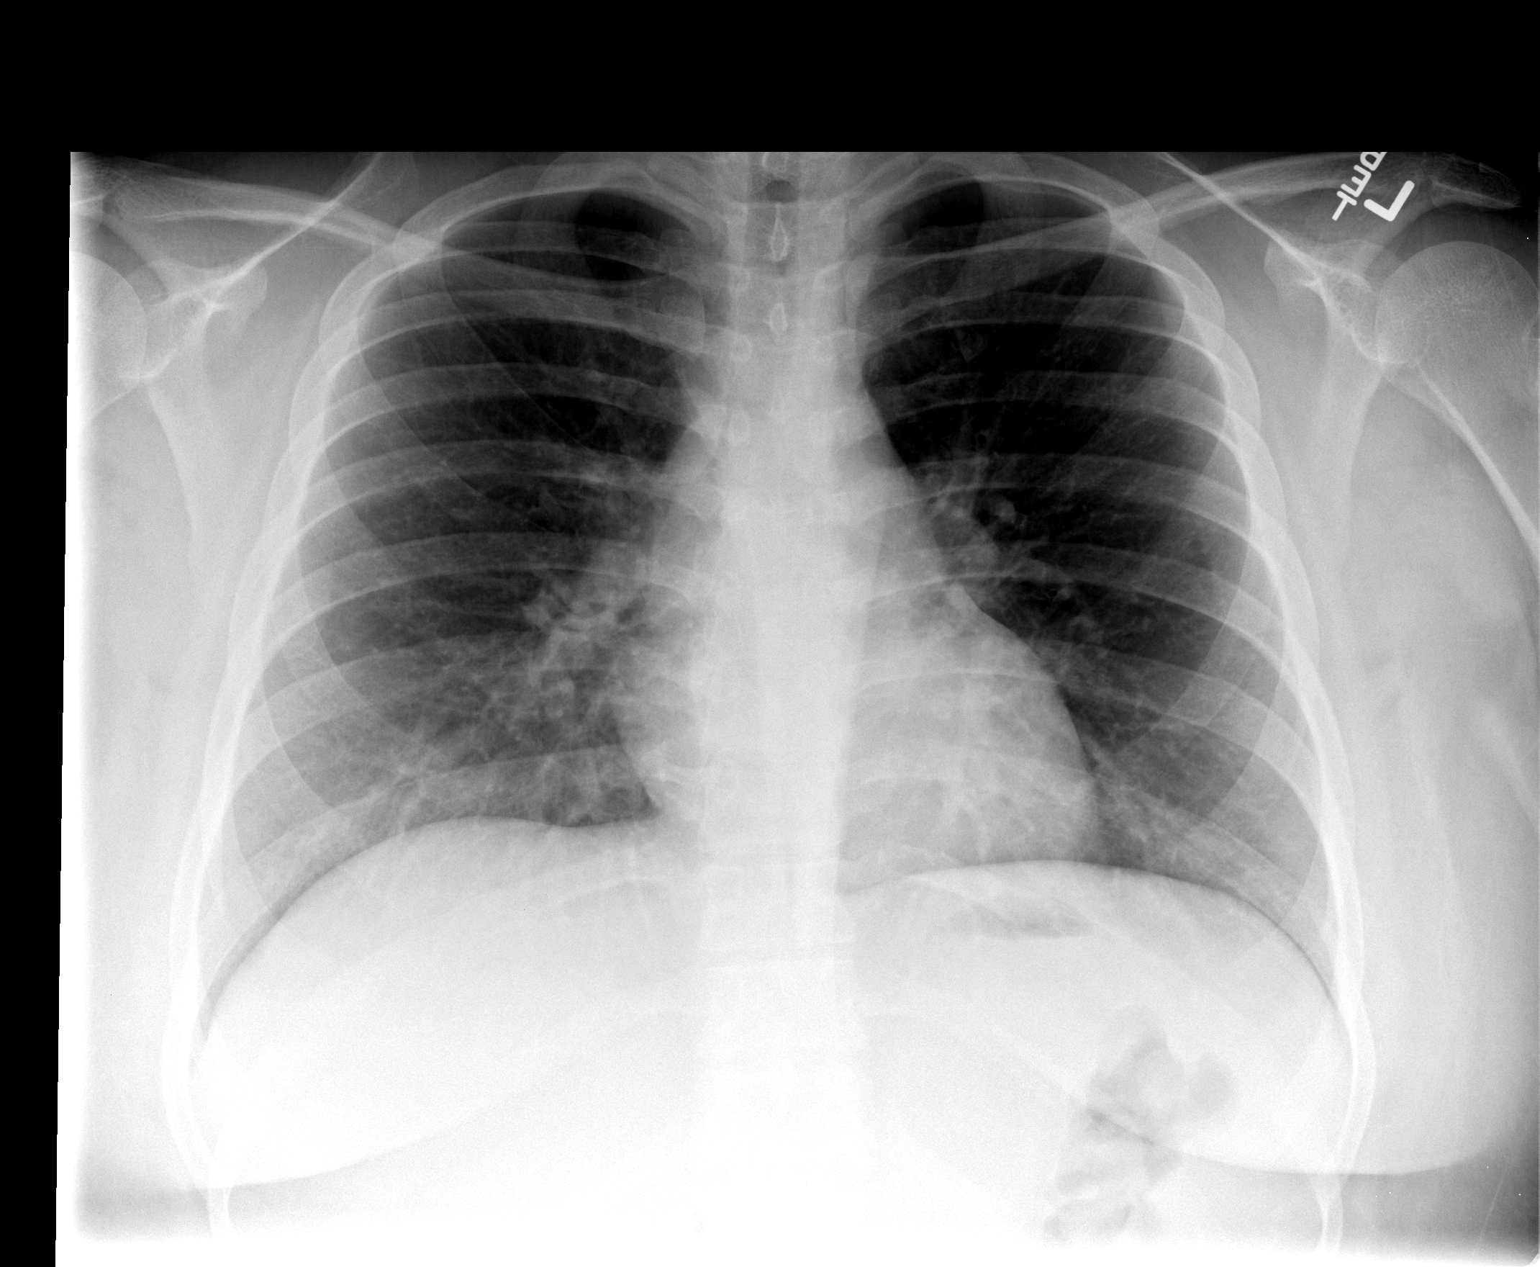

[view not recorded (2 of 2)]
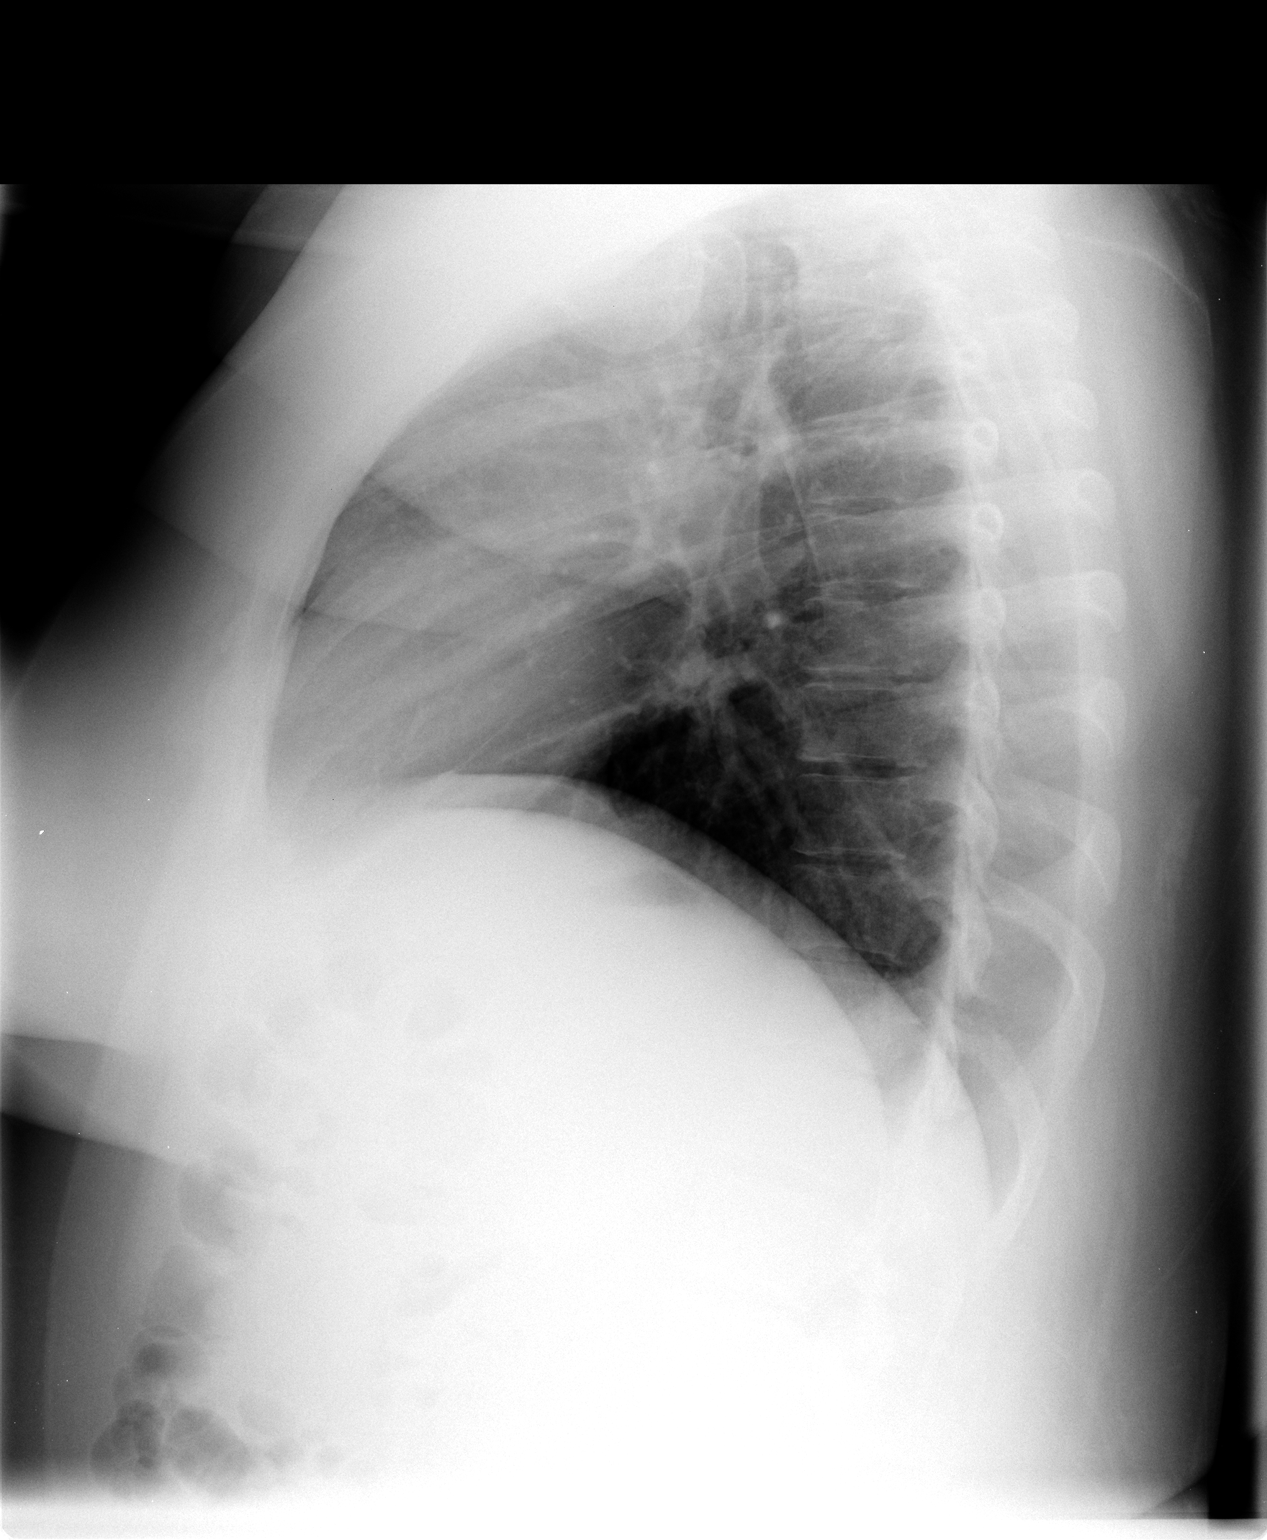

[2 of 2 positions shown; findings below may reference images not displayed]

FINDINGS: Cardiomediastinal silhouette is unremarkable. The lungs are clear
without pleural effusions or focal consolidations. Trachea projects
midline and there is no pneumothorax. Soft tissue planes and
included osseous structures are non-suspicious.
IMPRESSION: No acute cardiopulmonary process ; normal chest radiograph.

  By: Almina Hor

## 2019-12-27 ENCOUNTER — Other Ambulatory Visit: Payer: Self-pay

## 2019-12-27 ENCOUNTER — Ambulatory Visit (INDEPENDENT_AMBULATORY_CARE_PROVIDER_SITE_OTHER): Payer: Medicaid - Out of State | Admitting: *Deleted

## 2019-12-27 DIAGNOSIS — Z3201 Encounter for pregnancy test, result positive: Secondary | ICD-10-CM

## 2019-12-27 DIAGNOSIS — Z3490 Encounter for supervision of normal pregnancy, unspecified, unspecified trimester: Secondary | ICD-10-CM

## 2019-12-27 DIAGNOSIS — Z3200 Encounter for pregnancy test, result unknown: Secondary | ICD-10-CM

## 2019-12-27 LAB — POCT PREGNANCY, URINE: Preg Test, Ur: POSITIVE — AB

## 2019-12-27 NOTE — Progress Notes (Signed)
Pt left urine specimen for pregnancy test earlier today. I called her to provide results of +UPT. She reported LMP approximately 08/06/19 however states that her periods are irregular. She has not yet felt FM. Pt was advised that we will need to schedule [place] to determine gestational age and EDD. Following the [place], we will be able to schedule her prenatal care. Per chart review, pt has diabetes. She states that she has not take medication for Diabetes in over 1 year due to not having insurance. She was previously taking Victoza. She was advised to begin taking prenatal vitamins. Pt voiced understanding of all information and instructions given.

## 2019-12-28 ENCOUNTER — Encounter: Payer: Self-pay | Admitting: Student

## 2019-12-28 ENCOUNTER — Inpatient Hospital Stay (HOSPITAL_COMMUNITY): Payer: Medicaid - Out of State

## 2019-12-28 ENCOUNTER — Other Ambulatory Visit: Payer: Self-pay

## 2019-12-28 ENCOUNTER — Encounter (HOSPITAL_COMMUNITY): Payer: Self-pay | Admitting: Obstetrics and Gynecology

## 2019-12-28 ENCOUNTER — Inpatient Hospital Stay (HOSPITAL_COMMUNITY)
Admission: AD | Admit: 2019-12-28 | Discharge: 2019-12-28 | Disposition: A | Discharge status: Home / Self Care | Payer: Medicaid - Out of State | Attending: Obstetrics and Gynecology | Admitting: Obstetrics and Gynecology

## 2019-12-28 DIAGNOSIS — Z3491 Encounter for supervision of normal pregnancy, unspecified, first trimester: Secondary | ICD-10-CM

## 2019-12-28 DIAGNOSIS — R109 Unspecified abdominal pain: Secondary | ICD-10-CM | POA: Diagnosis not present

## 2019-12-28 DIAGNOSIS — O24312 Unspecified pre-existing diabetes mellitus in pregnancy, second trimester: Secondary | ICD-10-CM | POA: Diagnosis not present

## 2019-12-28 DIAGNOSIS — O34219 Maternal care for unspecified type scar from previous cesarean delivery: Secondary | ICD-10-CM | POA: Insufficient documentation

## 2019-12-28 DIAGNOSIS — R519 Headache, unspecified: Secondary | ICD-10-CM | POA: Diagnosis not present

## 2019-12-28 DIAGNOSIS — O26899 Other specified pregnancy related conditions, unspecified trimester: Secondary | ICD-10-CM

## 2019-12-28 DIAGNOSIS — Z79899 Other long term (current) drug therapy: Secondary | ICD-10-CM | POA: Diagnosis not present

## 2019-12-28 DIAGNOSIS — Z3A16 16 weeks gestation of pregnancy: Secondary | ICD-10-CM | POA: Insufficient documentation

## 2019-12-28 DIAGNOSIS — Z8632 Personal history of gestational diabetes: Secondary | ICD-10-CM | POA: Diagnosis not present

## 2019-12-28 DIAGNOSIS — O26892 Other specified pregnancy related conditions, second trimester: Secondary | ICD-10-CM

## 2019-12-28 DIAGNOSIS — E119 Type 2 diabetes mellitus without complications: Secondary | ICD-10-CM | POA: Insufficient documentation

## 2019-12-28 LAB — COMPREHENSIVE METABOLIC PANEL
ALT: 32 U/L (ref 0–44)
AST: 23 U/L (ref 15–41)
Albumin: 3.3 g/dL — ABNORMAL LOW (ref 3.5–5.0)
Alkaline Phosphatase: 85 U/L (ref 38–126)
Anion gap: 10 (ref 5–15)
BUN: 8 mg/dL (ref 6–20)
CO2: 24 mmol/L (ref 22–32)
Calcium: 8.8 mg/dL — ABNORMAL LOW (ref 8.9–10.3)
Chloride: 100 mmol/L (ref 98–111)
Creatinine, Ser: 0.45 mg/dL (ref 0.44–1.00)
GFR calc Af Amer: >60 mL/min (ref >60)
GFR calc non Af Amer: >60 mL/min (ref >60)
Glucose, Bld: 163 mg/dL — ABNORMAL HIGH (ref 70–99)
Potassium: 3.5 mmol/L (ref 3.5–5.1)
Sodium: 134 mmol/L — ABNORMAL LOW (ref 135–145)
Total Bilirubin: 0.7 mg/dL (ref 0.3–1.2)
Total Protein: 6.5 g/dL (ref 6.5–8.1)

## 2019-12-28 LAB — WET PREP, GENITAL
Clue Cells Wet Prep HPF POC: NONE SEEN
Sperm: NONE SEEN
Trich, Wet Prep: NONE SEEN
Yeast Wet Prep HPF POC: NONE SEEN

## 2019-12-28 LAB — URINALYSIS, ROUTINE W REFLEX MICROSCOPIC
Bilirubin Urine: NEGATIVE
Glucose, UA: 150 mg/dL — AB
Hgb urine dipstick: NEGATIVE
Ketones, ur: NEGATIVE mg/dL
Leukocytes,Ua: NEGATIVE
Nitrite: NEGATIVE
Protein, ur: NEGATIVE mg/dL
Specific Gravity, Urine: 1.009 (ref 1.005–1.030)
pH: 6 (ref 5.0–8.0)

## 2019-12-28 LAB — CBC
HCT: 38.8 % (ref 36.0–46.0)
Hemoglobin: 13.8 g/dL (ref 12.0–15.0)
MCH: 32.2 pg (ref 26.0–34.0)
MCHC: 35.6 g/dL (ref 30.0–36.0)
MCV: 90.7 fL (ref 80.0–100.0)
Platelets: 223 10*3/uL (ref 150–400)
RBC: 4.28 MIL/uL (ref 3.87–5.11)
RDW: 11.6 % (ref 11.5–15.5)
WBC: 9.4 10*3/uL (ref 4.0–10.5)
nRBC: 0 % (ref 0.0–0.2)

## 2019-12-28 LAB — GLUCOSE, CAPILLARY: Glucose-Capillary: 141 mg/dL — ABNORMAL HIGH (ref 70–99)

## 2019-12-28 MED ORDER — CYCLOBENZAPRINE HCL 5 MG PO TABS
10.0000 mg | ORAL_TABLET | Freq: Once | ORAL | Status: AC
  Administered 2019-12-28: 10 mg via ORAL
  Filled 2019-12-28: qty 2

## 2019-12-28 NOTE — Progress Notes (Signed)
Chart reviewed for nurse visit. Agree with plan of care.   Marylene Land, CNM 12/28/2019 8:58 PM

## 2019-12-28 NOTE — MAU Note (Addendum)
Found out preg 2 days ago. Has been having some cramping, having a lot of headaches- worse when she stands (has not taken anything, feeling a little whoozy today.  lmp was in March, has hx of irreg cycles.  This was the first test she had done. [facility] at [facility]. Pt is a diabetic, type 2.

## 2019-12-28 NOTE — MAU Provider Note (Signed)
Patient April [name] is a 34 y.o. G2P1001  at [redacted]w[redacted]d here with complaints of abdominal pain and headache. She found out she was pregnant two days ago; LMP was in March but she had very irregular periods. She reports feeling "off for a while". She has a history of gestational diabetes and was also diagnosed by MD with diabetes in 2017.  She denies nausea, vomiting, constipation, vaginal bleeding, vaginal discharge, pelvic pain. She has a history of c/section in 2015.  History     CSN: [id number]  Arrival date and time: 12/28/19 1449   None     No chief complaint on file.  Abdominal Pain This is a new problem. The current episode started in the past 7 days. The problem occurs intermittently. The pain is located in the suprapubic region. The pain is at a severity of 8/10. The quality of the pain is cramping. The abdominal pain does not radiate. Pertinent negatives include no diarrhea, dysuria, fever, nausea or vomiting.  She also endorses a headache that happens when she stands up.   OB History    Gravida  2   Para  1   Term  1   Preterm      AB      Living  1     SAB      TAB      Ectopic      Multiple  0   Live Births              Past Medical History:  Diagnosis Date   Diabetes mellitus without complication (HCC)    Gall stones    during 1st trimester of pregnancy   Gestational diabetes     Past Surgical History:  Procedure Laterality Date   CESAREAN SECTION N/A 05/14/2014   Procedure: CESAREAN SECTION;  Surgeon: Tereso Newcomer, MD;  Location: [facility];  Service: Obstetrics;  Laterality: N/A;   NO PAST SURGERIES      Family History  Problem Relation Age of Onset   Hypertension Mother     Social History   Tobacco Use   Smoking status: Never Smoker   Smokeless tobacco: Never Used  Vaping Use   Vaping Use: Never used  Substance Use Topics   Alcohol use: No   Drug use: No    Allergies: No Known Allergies  Medications Prior to  Admission  Medication Sig Dispense Refill Last Dose   Prenatal Vit-Fe Fumarate-FA (PRENATAL VITAMINS) 28-0.8 MG TABS Take by mouth.   12/28/2019 at Unknown time    Review of Systems  Constitutional: Negative for fever.  Gastrointestinal: Positive for abdominal pain. Negative for diarrhea, nausea and vomiting.  Genitourinary: Negative for dysuria.   Physical Exam   Blood pressure 121/76, pulse 74, temperature 98.6 F (37 C), temperature source Oral, resp. rate 18, height 5\' 2"  (1.575 m), weight 103 kg, last menstrual period 09/03/2019, SpO2 100 %, currently breastfeeding.  Physical Exam HENT:     Head: Normocephalic.     Mouth/Throat:     Mouth: Mucous membranes are moist.  Cardiovascular:     Rate and Rhythm: Normal rate.  Pulmonary:     Effort: Pulmonary effort is normal.  Abdominal:     General: Abdomen is flat.  Genitourinary:    General: Normal vulva.  Musculoskeletal:        General: Normal range of motion.  Skin:    General: Skin is warm.  Neurological:     Mental Status: She is alert.  MAU Course  Procedures  MDM -will do [place] to rule out ectopic>US shows fetus at 9 weeks with cardiac activity.  -Flexeril helped headache "a little bit".  Blood glucose today in MAU is 140, UA with glyosuria but no ketones so IV not done.  -will check CBC, patient will not stay for results but if anemia Assessment and Plan   1. Viable pregnancy in first trimester   2. Abdominal pain affecting pregnancy    -Advised patient to drink water, not to skip snacks, make sure she checks her sugars at home. Reviewed normal values for fasting and PP. Patient states that she is very motivated to keep her blood sugars under control.   -Number for [facility] given; patient is not sure where she will start prenatal care. She lives in [place] but would like to deliver at [facility]; she is in the process of applying for Medicaid.   -Reviewed warning signs of first trimester and when to return to  MAU.   -CBC and CMP pending; will notify patient if she needs iron supplements.   Charlesetta Garibaldi Akiem Urieta 12/28/2019, 5:55 PM

## 2019-12-28 NOTE — Discharge Instructions (Signed)
Prenatal Care Providers      [facility]   Phone: [phone]

## 2019-12-29 LAB — GC/CHLAMYDIA PROBE AMP (~~LOC~~) NOT AT ARMC
Chlamydia: NEGATIVE
Comment: NEGATIVE
Comment: NORMAL
Neisseria Gonorrhea: NEGATIVE

## 2019-12-30 ENCOUNTER — Telehealth: Payer: Self-pay | Admitting: Advanced Practice Midwife

## 2019-12-30 NOTE — Telephone Encounter (Signed)
Patient spouse called in saying they found out that the patient sugar was high and wants to know what they should do.

## 2019-12-30 NOTE — Telephone Encounter (Signed)
Telephoned patient at home number. Patient did not state sugar was abnormal. Verified appointment with patient and patient wanted to know what to bring to appointment. Advised patient would need photo id and insurance card. Patient has applied for emergency Medicaid.

## 2020-01-09 ENCOUNTER — Ambulatory Visit: Payer: Self-pay | Admitting: *Deleted

## 2020-01-09 ENCOUNTER — Encounter: Payer: Self-pay | Admitting: Advanced Practice Midwife

## 2020-01-09 ENCOUNTER — Ambulatory Visit (INDEPENDENT_AMBULATORY_CARE_PROVIDER_SITE_OTHER): Payer: Medicaid - Out of State | Admitting: Advanced Practice Midwife

## 2020-01-09 VITALS — BP 124/75 | HR 80 | Wt 229.0 lb

## 2020-01-09 DIAGNOSIS — Z3A10 10 weeks gestation of pregnancy: Secondary | ICD-10-CM

## 2020-01-09 DIAGNOSIS — O09291 Supervision of pregnancy with other poor reproductive or obstetric history, first trimester: Secondary | ICD-10-CM

## 2020-01-09 DIAGNOSIS — O34218 Maternal care for other type scar from previous cesarean delivery: Secondary | ICD-10-CM | POA: Diagnosis not present

## 2020-01-09 DIAGNOSIS — O0990 Supervision of high risk pregnancy, unspecified, unspecified trimester: Secondary | ICD-10-CM | POA: Insufficient documentation

## 2020-01-09 DIAGNOSIS — Z8632 Personal history of gestational diabetes: Secondary | ICD-10-CM

## 2020-01-09 DIAGNOSIS — O24111 Pre-existing diabetes mellitus, type 2, in pregnancy, first trimester: Secondary | ICD-10-CM

## 2020-01-09 DIAGNOSIS — E119 Type 2 diabetes mellitus without complications: Secondary | ICD-10-CM

## 2020-01-09 DIAGNOSIS — Z6841 Body Mass Index (BMI) 40.0 and over, adult: Secondary | ICD-10-CM

## 2020-01-09 DIAGNOSIS — Z98891 History of uterine scar from previous surgery: Secondary | ICD-10-CM | POA: Insufficient documentation

## 2020-01-09 DIAGNOSIS — O09299 Supervision of pregnancy with other poor reproductive or obstetric history, unspecified trimester: Secondary | ICD-10-CM

## 2020-01-09 DIAGNOSIS — O0991 Supervision of high risk pregnancy, unspecified, first trimester: Secondary | ICD-10-CM

## 2020-01-09 DIAGNOSIS — O09521 Supervision of elderly multigravida, first trimester: Secondary | ICD-10-CM | POA: Diagnosis not present

## 2020-01-09 LAB — POCT URINALYSIS DIPSTICK OB
Blood, UA: NEGATIVE
Ketones, UA: NEGATIVE
Leukocytes, UA: NEGATIVE
Nitrite, UA: NEGATIVE
POC,PROTEIN,UA: NEGATIVE

## 2020-01-09 MED ORDER — BLOOD PRESSURE MONITOR MISC: 0 refills | Status: AC

## 2020-01-09 MED ORDER — ASPIRIN 81 MG PO CHEW: 162.0000 mg | CHEWABLE_TABLET | Freq: Every day | ORAL | 8 refills | Status: DC

## 2020-01-09 NOTE — Patient Instructions (Signed)
April [name], I greatly value your feedback.  If you receive a survey following your visit with [place] today, we appreciate you taking the time to fill it out.  Thanks, [name] CNM   [facility] ([address] [place], [place] [address]) Entrance C, located off of [address] Free 24/7 valet parking   Nausea & Vomiting  Have saltine crackers or pretzels by your bed and eat a few bites before you raise your head out of bed in the morning  Eat small frequent meals throughout the day instead of large meals  Drink plenty of fluids throughout the day to stay hydrated, just don't drink a lot of fluids with your meals.  This can make your stomach fill up faster making you feel sick  Do not brush your teeth right after you eat  Products with real ginger are good for nausea, like ginger ale and ginger hard candy Make sure it says made with real ginger!  Sucking on sour candy like lemon heads is also good for nausea  If your prenatal vitamins make you nauseated, take them at night so you will sleep through the nausea  Sea Bands  If you feel like you need medicine for the nausea & vomiting please let us know  If you are unable to keep any fluids or food down please let us know   Constipation  Drink plenty of fluid, preferably water, throughout the day  Eat foods high in fiber such as fruits, vegetables, and grains  Exercise, such as walking, is a good way to keep your bowels regular  Drink warm fluids, especially warm prune juice, or decaf coffee  Eat a 1/2 cup of real oatmeal (not instant), 1/2 cup applesauce, and 1/2-1 cup warm prune juice every day  If needed, you may take Colace (docusate sodium) stool softener once or twice a day to help keep the stool soft.   If you still are having problems with constipation, you may take Miralax once daily as needed to help keep your bowels regular.   Home Blood Pressure Monitoring for Patients   Your  provider has recommended that you check your blood pressure (BP) at least once a week at home. If you do not have a blood pressure cuff at home, one will be provided for you. Contact your provider if you have not received your monitor within 1 week.   Helpful Tips for Accurate Home Blood Pressure Checks  . Don't smoke, exercise, or drink caffeine 30 minutes before checking your BP . Use the restroom before checking your BP (a full bladder can raise your pressure) . Relax in a comfortable upright chair . Feet on the ground . Left arm resting comfortably on a flat surface at the level of your heart . Legs uncrossed . Back supported . Sit quietly and don't talk . Place the cuff on your bare arm . Adjust snuggly, so that only two fingertips can fit between your skin and the top of the cuff . Check 2 readings separated by at least one minute . Keep a log of your BP readings . For a visual, please reference this diagram: http://ccnc.care/bpdiagram  Provider Name: [facility]     Phone: [phone]  Zone 1: ALL CLEAR  Continue to monitor your symptoms:  . BP reading is less than 140 (top number) or less than 90 (bottom number)  . No right upper stomach pain . No headaches or seeing spots .  No feeling nauseated or throwing up . No swelling in face and hands  Zone 2: CAUTION Call your doctor's office for any of the following:  . BP reading is greater than 140 (top number) or greater than 90 (bottom number)  . Stomach pain under your ribs in the middle or right side . Headaches or seeing spots . Feeling nauseated or throwing up . Swelling in face and hands  Zone 3: EMERGENCY  Seek immediate medical care if you have any of the following:  . BP reading is greater than160 (top number) or greater than 110 (bottom number) . Severe headaches not improving with Tylenol . Serious difficulty catching your breath . Any worsening symptoms from Zone 2    First Trimester of Pregnancy The  first trimester of pregnancy is from week 1 until the end of week 12 (months 1 through 3). A week after a sperm fertilizes an egg, the egg will implant on the wall of the uterus. This embryo will begin to develop into a baby. Genes from you and your partner are forming the baby. The female genes determine whether the baby is a boy or a girl. At 6-8 weeks, the eyes and face are formed, and the heartbeat can be seen on ultrasound. At the end of 12 weeks, all the baby's organs are formed.  Now that you are pregnant, you will want to do everything you can to have a healthy baby. Two of the most important things are to get good prenatal care and to follow your health care provider's instructions. Prenatal care is all the medical care you receive before the baby's birth. This care will help prevent, find, and treat any problems during the pregnancy and childbirth. BODY CHANGES Your body goes through many changes during pregnancy. The changes vary from woman to woman.   You may gain or lose a couple of pounds at first.  You may feel sick to your stomach (nauseous) and throw up (vomit). If the vomiting is uncontrollable, call your health care provider.  You may tire easily.  You may develop headaches that can be relieved by medicines approved by your health care provider.  You may urinate more often. Painful urination may mean you have a bladder infection.  You may develop heartburn as a result of your pregnancy.  You may develop constipation because certain hormones are causing the muscles that push waste through your intestines to slow down.  You may develop hemorrhoids or swollen, bulging veins (varicose veins).  Your breasts may begin to grow larger and become tender. Your nipples may stick out more, and the tissue that surrounds them (areola) may become darker.  Your gums may bleed and may be sensitive to brushing and flossing.  Dark spots or blotches (chloasma, mask of pregnancy) may develop on  your face. This will likely fade after the baby is born.  Your menstrual periods will stop.  You may have a loss of appetite.  You may develop cravings for certain kinds of food.  You may have changes in your emotions from day to day, such as being excited to be pregnant or being concerned that something may go wrong with the pregnancy and baby.  You may have more vivid and strange dreams.  You may have changes in your hair. These can include thickening of your hair, rapid growth, and changes in texture. Some women also have hair loss during or after pregnancy, or hair that feels dry or thin. Your hair will most  likely return to normal after your baby is born. WHAT TO EXPECT AT YOUR PRENATAL VISITS During a routine prenatal visit:  You will be weighed to make sure you and the baby are growing normally.  Your blood pressure will be taken.  Your abdomen will be measured to track your baby's growth.  The fetal heartbeat will be listened to starting around week 10 or 12 of your pregnancy.  Test results from any previous visits will be discussed. Your health care provider may ask you:  How you are feeling.  If you are feeling the baby move.  If you have had any abnormal symptoms, such as leaking fluid, bleeding, severe headaches, or abdominal cramping.  If you have any questions. Other tests that may be performed during your first trimester include:  Blood tests to find your blood type and to check for the presence of any previous infections. They will also be used to check for low iron levels (anemia) and Rh antibodies. Later in the pregnancy, blood tests for diabetes will be done along with other tests if problems develop.  Urine tests to check for infections, diabetes, or protein in the urine.  An ultrasound to confirm the proper growth and development of the baby.  An amniocentesis to check for possible genetic problems.  Fetal screens for spina bifida and Down  syndrome.  You may need other tests to make sure you and the baby are doing well. HOME CARE INSTRUCTIONS  Medicines  Follow your health care provider's instructions regarding medicine use. Specific medicines may be either safe or unsafe to take during pregnancy.  Take your prenatal vitamins as directed.  If you develop constipation, try taking a stool softener if your health care provider approves. Diet  Eat regular, well-balanced meals. Choose a variety of foods, such as meat or vegetable-based protein, fish, milk and low-fat dairy products, vegetables, fruits, and whole grain breads and cereals. Your health care provider will help you determine the amount of weight gain that is right for you.  Avoid raw meat and uncooked cheese. These carry germs that can cause birth defects in the baby.  Eating four or five small meals rather than three large meals a day may help relieve nausea and vomiting. If you start to feel nauseous, eating a few soda crackers can be helpful. Drinking liquids between meals instead of during meals also seems to help nausea and vomiting.  If you develop constipation, eat more high-fiber foods, such as fresh vegetables or fruit and whole grains. Drink enough fluids to keep your urine clear or pale yellow. Activity and Exercise  Exercise only as directed by your health care provider. Exercising will help you:  Control your weight.  Stay in shape.  Be prepared for labor and delivery.  Experiencing pain or cramping in the lower abdomen or low back is a good sign that you should stop exercising. Check with your health care provider before continuing normal exercises.  Try to avoid standing for long periods of time. Move your legs often if you must stand in one place for a long time.  Avoid heavy lifting.  Wear low-heeled shoes, and practice good posture.  You may continue to have sex unless your health care provider directs you otherwise. Relief of Pain or  Discomfort  Wear a good support bra for breast tenderness.    Take warm sitz baths to soothe any pain or discomfort caused by hemorrhoids. Use hemorrhoid cream if your health care provider approves.  Rest with your legs elevated if you have leg cramps or low back pain.  If you develop varicose veins in your legs, wear support hose. Elevate your feet for 15 minutes, 3-4 times a day. Limit salt in your diet. Prenatal Care  Schedule your prenatal visits by the twelfth week of pregnancy. They are usually scheduled monthly at first, then more often in the last 2 months before delivery.  Write down your questions. Take them to your prenatal visits.  Keep all your prenatal visits as directed by your health care provider. Safety  Wear your seat belt at all times when driving.  Make a list of emergency phone numbers, including numbers for family, friends, the hospital, and police and fire departments. General Tips  Ask your health care provider for a referral to a local prenatal education class. Begin classes no later than at the beginning of month 6 of your pregnancy.  Ask for help if you have counseling or nutritional needs during pregnancy. Your health care provider can offer advice or refer you to specialists for help with various needs.  Do not use hot tubs, steam rooms, or saunas.  Do not douche or use tampons or scented sanitary pads.  Do not cross your legs for long periods of time.  Avoid cat litter boxes and soil used by cats. These carry germs that can cause birth defects in the baby and possibly loss of the fetus by miscarriage or stillbirth.  Avoid all smoking, herbs, alcohol, and medicines not prescribed by your health care provider. Chemicals in these affect the formation and growth of the baby.  Schedule a dentist appointment. At home, brush your teeth with a soft toothbrush and be gentle when you floss. SEEK MEDICAL CARE IF:   You have dizziness.  You have mild  pelvic cramps, pelvic pressure, or nagging pain in the abdominal area.  You have persistent nausea, vomiting, or diarrhea.  You have a bad smelling vaginal discharge.  You have pain with urination.  You notice increased swelling in your face, hands, legs, or ankles. SEEK IMMEDIATE MEDICAL CARE IF:   You have a fever.  You are leaking fluid from your vagina.  You have spotting or bleeding from your vagina.  You have severe abdominal cramping or pain.  You have rapid weight gain or loss.  You vomit blood or material that looks like coffee grounds.  You are exposed to [place] measles and have never had them.  You are exposed to fifth disease or chickenpox.  You develop a severe headache.  You have shortness of breath.  You have any kind of trauma, such as from a fall or a car accident. Document Released: 05/27/2001 Document Revised: 10/17/2013 Document Reviewed: 04/12/2013 [facility] Patient Information 2015 [place], [place]. This information is not intended to replace advice given to you by your health care provider. Make sure you discuss any questions you have with your health care provider.

## 2020-01-09 NOTE — Progress Notes (Signed)
INITIAL OBSTETRICAL VISIT Patient name: April [name] MRN [id number]  Date of birth: [birth year] Chief Complaint:   Initial Prenatal Visit (tired, headaches)  History of Present Illness:   April [name] is a 34 y.o. G24P1001 female at 63w6dby [place] 9 weeks with an Estimated Date of Delivery: 07/31/20 being seen today for her initial obstetrical visit.   Her obstetrical history is significant for GDMA2 & pLTCS.   Today she reports fatigue.  Depression screen PHQ 2/9 01/09/2020  Decreased Interest 2  Down, Depressed, Hopeless 1  PHQ - 2 Score 3  Altered sleeping 3  Tired, decreased energy 3  Change in appetite 0  Feeling bad or failure about yourself  1  Trouble concentrating 1  Moving slowly or fidgety/restless 0  Suicidal thoughts 0  PHQ-9 Score 11    Patient's last menstrual period was 09/03/2019. Last pap didn't ask- requesting for next visit.  Review of Systems:   Pertinent items are noted in HPI Denies cramping/contractions, leakage of fluid, vaginal bleeding, abnormal vaginal discharge w/ itching/odor/irritation, headaches, visual changes, shortness of breath, chest pain, abdominal pain, severe nausea/vomiting, or problems with urination or bowel movements unless otherwise stated above.  Pertinent History Reviewed:  Reviewed past medical,surgical, social, obstetrical and family history.  Reviewed problem list, medications and allergies. OB History  Gravida Para Term Preterm AB Living  '2 1 1     1  '$ SAB TAB Ectopic Multiple Live Births        0 1    # Outcome Date GA Lbr Len/2nd Weight Sex Delivery Anes PTL Lv  2 Current           1 Term 05/14/14 313w4d8 lb 10 oz (3.912 kg) F CS-LTranv EPI N LIV     Complications: Failure to Progress in First Stage, Gestational diabetes   Physical Assessment:   Vitals:   01/09/20 1347  BP: 124/75  Pulse: 80  Weight: (!) 229 lb (103.9 kg)  Body mass index is 41.88 kg/m.       Physical Examination:  General appearance - well  appearing, and in no distress  Mental status - alert, oriented to person, place, and time  Psych:  She has a normal mood and affect  Skin - warm and dry, normal color, no suspicious lesions noted  Chest - effort normal, all lung fields clear to auscultation bilaterally  Heart - normal rate and regular rhythm  Abdomen - soft, nontender  Extremities:  No swelling or varicosities noted  Pelvic - not indicated  Thin prep pap is not done   Declines NT & genetic screening  Results for orders placed or performed in visit on 01/09/20 (from the past 24 hour(s))  POC Urinalysis Dipstick OB   Collection Time: 01/09/20  2:13 PM  Result Value Ref Range   Color, UA     Clarity, UA     Glucose, UA Small (1+) (A) Negative   Bilirubin, UA     Ketones, UA neg    Spec Grav, UA     Blood, UA neg    pH, UA     POC,PROTEIN,UA Negative Negative, Trace, Small (1+), Moderate (2+), Large (3+), 4+   Urobilinogen, UA     Nitrite, UA neg    Leukocytes, UA Negative Negative   Appearance     Odor      Assessment & Plan:  1) High-Risk Pregnancy G2P1001 at 1068w6dth an Estimated Date of Delivery: 07/31/20   2) Initial  OB visit  3) AMA, will be 35 at delivery  4) Previous C/S for FTP, options reviewed, will continue to eval as pregnancy progresses   5) Dx in 2017 with T2DM, couldn't afford meds due to loss of insurance, will get HgbA1c and CMET as she reports her LFTs were elevated, then discuss with MD; bASA '162mg'$  daily  Meds:  Meds ordered this encounter  Medications  . Blood Pressure Monitor MISC    Sig: For regular home bp monitoring during pregnancy    Dispense:  1 each    Refill:  0    Z34.90  . aspirin 81 MG chewable tablet    Sig: Chew 2 tablets (162 mg total) by mouth daily.    Dispense:  60 tablet    Refill:  8    Order Specific Question:   Supervising Provider    Answer:   Tania Ade H [[id number]]    Initial labs obtained Continue prenatal vitamins Reviewed n/v relief measures and  warning s/s to report Reviewed recommended weight gain based on pre-gravid BMI Encouraged well-balanced diet Genetic & carrier screening discussed: declines Panorama and NT/IT, declines Horizon 14  Ultrasound discussed; fetal survey: requested CCNC completed> form faxed if has or is planning to apply for medicaid The nature of [place] for [employer] with multiple MDs and other Advanced Practice Providers was explained to patient; also emphasized that fellows, residents, and students are part of our team. Ordered home bp cuff. Check bp weekly, let us know if >140/90.   Indications for ASA therapy (per uptodate) One of the following:  T1DM or T2DM Yes   Indications for early A1C (per uptodate) BMI >=25 (>=23 in Asian women) AND one of the following GDM in a previous pregnancy Yes Previous A1C?5.7, impaired glucose tolerance, or impaired fasting glucose on previous testing Yes  Follow-up: Return in about 4 weeks (around 02/06/2020) for in person, HROB.   Orders Placed This Encounter  Procedures  . Urine Culture  . GC/Chlamydia Probe Amp  . Hemoglobin A1c  . CBC/D/Plt+RPR+Rh+ABO+Rub Ab...  . Pain Management Screening Profile (10S)  . Comp Met (CMET)  . POC Urinalysis Dipstick OB    Myrtis Ser [facility] 01/09/2020 2:40 PM

## 2020-01-10 LAB — PMP SCREEN PROFILE (10S), URINE
Amphetamine Scrn, Ur: NEGATIVE ng/mL
BARBITURATE SCREEN URINE: NEGATIVE ng/mL
BENZODIAZEPINE SCREEN, URINE: NEGATIVE ng/mL
CANNABINOIDS UR QL SCN: NEGATIVE ng/mL
Cocaine (Metab) Scrn, Ur: NEGATIVE ng/mL
Creatinine(Crt), U: 175.3 mg/dL (ref 20.0–300.0)
Methadone Screen, Urine: NEGATIVE ng/mL
OXYCODONE+OXYMORPHONE UR QL SCN: NEGATIVE ng/mL
Opiate Scrn, Ur: NEGATIVE ng/mL
Ph of Urine: 5.3 (ref 4.5–8.9)
Phencyclidine Qn, Ur: NEGATIVE ng/mL
Propoxyphene Scrn, Ur: NEGATIVE ng/mL

## 2020-01-10 LAB — CBC/D/PLT+RPR+RH+ABO+RUB AB...
Antibody Screen: NEGATIVE
Basophils Absolute: 0 10*3/uL (ref 0.0–0.2)
Basos: 1 %
EOS (ABSOLUTE): 0.2 10*3/uL (ref 0.0–0.4)
Eos: 2 %
HCV Ab: <0.1 s/co ratio (ref 0.0–0.9)
HIV Screen 4th Generation wRfx: NONREACTIVE
Hematocrit: 40 % (ref 34.0–46.6)
Hemoglobin: 14.1 g/dL (ref 11.1–15.9)
Hepatitis B Surface Ag: NEGATIVE
Immature Grans (Abs): 0 10*3/uL (ref 0.0–0.1)
Immature Granulocytes: 0 %
Lymphocytes Absolute: 2.2 10*3/uL (ref 0.7–3.1)
Lymphs: 25 %
MCH: 32.7 pg (ref 26.6–33.0)
MCHC: 35.3 g/dL (ref 31.5–35.7)
MCV: 93 fL (ref 79–97)
Monocytes Absolute: 0.5 10*3/uL (ref 0.1–0.9)
Monocytes: 5 %
Neutrophils Absolute: 5.9 10*3/uL (ref 1.4–7.0)
Neutrophils: 67 %
Platelets: 208 10*3/uL (ref 150–450)
RBC: 4.31 x10E6/uL (ref 3.77–5.28)
RDW: 12.5 % (ref 11.7–15.4)
RPR Ser Ql: NONREACTIVE
Rh Factor: POSITIVE
Rubella Antibodies, IGG: 6.4 index (ref >0.99)
WBC: 8.8 10*3/uL (ref 3.4–10.8)

## 2020-01-10 LAB — COMPREHENSIVE METABOLIC PANEL
ALT: 36 IU/L — ABNORMAL HIGH (ref 0–32)
AST: 25 IU/L (ref 0–40)
Albumin/Globulin Ratio: 1.2 (ref 1.2–2.2)
Albumin: 3.9 g/dL (ref 3.8–4.8)
Alkaline Phosphatase: 107 IU/L (ref 48–121)
BUN/Creatinine Ratio: 18 (ref 9–23)
BUN: 10 mg/dL (ref 6–20)
Bilirubin Total: 0.2 mg/dL (ref 0.0–1.2)
CO2: 22 mmol/L (ref 20–29)
Calcium: 9.2 mg/dL (ref 8.7–10.2)
Chloride: 100 mmol/L (ref 96–106)
Creatinine, Ser: 0.57 mg/dL (ref 0.57–1.00)
GFR calc Af Amer: 140 mL/min/{1.73_m2} (ref >59)
GFR calc non Af Amer: 121 mL/min/{1.73_m2} (ref >59)
Globulin, Total: 3.2 g/dL (ref 1.5–4.5)
Glucose: 225 mg/dL — ABNORMAL HIGH (ref 65–99)
Potassium: 3.8 mmol/L (ref 3.5–5.2)
Sodium: 135 mmol/L (ref 134–144)
Total Protein: 7.1 g/dL (ref 6.0–8.5)

## 2020-01-10 LAB — HCV INTERPRETATION

## 2020-01-10 LAB — HEMOGLOBIN A1C
Est. average glucose Bld gHb Est-mCnc: 223 mg/dL
Hgb A1c MFr Bld: 9.4 % — ABNORMAL HIGH (ref 4.8–5.6)

## 2020-01-10 LAB — GC/CHLAMYDIA PROBE AMP
Chlamydia trachomatis, NAA: NEGATIVE
Neisseria Gonorrhoeae by PCR: NEGATIVE

## 2020-01-10 LAB — MED LIST OPTION NOT SELECTED

## 2020-01-11 ENCOUNTER — Encounter: Payer: Self-pay | Admitting: Advanced Practice Midwife

## 2020-01-11 ENCOUNTER — Telehealth: Payer: Self-pay | Admitting: Advanced Practice Midwife

## 2020-01-11 ENCOUNTER — Other Ambulatory Visit: Payer: Self-pay | Admitting: Advanced Practice Midwife

## 2020-01-11 ENCOUNTER — Other Ambulatory Visit: Payer: Self-pay | Admitting: *Deleted

## 2020-01-11 DIAGNOSIS — O24111 Pre-existing diabetes mellitus, type 2, in pregnancy, first trimester: Secondary | ICD-10-CM

## 2020-01-11 DIAGNOSIS — O0991 Supervision of high risk pregnancy, unspecified, first trimester: Secondary | ICD-10-CM

## 2020-01-11 DIAGNOSIS — O24119 Pre-existing diabetes mellitus, type 2, in pregnancy, unspecified trimester: Secondary | ICD-10-CM | POA: Insufficient documentation

## 2020-01-11 DIAGNOSIS — E119 Type 2 diabetes mellitus without complications: Secondary | ICD-10-CM | POA: Insufficient documentation

## 2020-01-11 LAB — URINE CULTURE

## 2020-01-11 MED ORDER — ACCU-CHEK SOFTCLIX LANCETS MISC: 12 refills | Status: AC

## 2020-01-11 MED ORDER — ACCU-CHEK GUIDE VI STRP: ORAL_STRIP | 12 refills | Status: AC

## 2020-01-11 MED ORDER — GLYBURIDE 2.5 MG PO TABS
2.5000 mg | ORAL_TABLET | Freq: Every day | ORAL | 6 refills | Status: DC
Start: 2020-01-11 — End: 2020-02-06

## 2020-01-11 MED ORDER — ACCU-CHEK GUIDE ME W/DEVICE KIT: 1.0000 | PACK | Freq: Four times a day (QID) | 0 refills | Status: AC

## 2020-01-11 NOTE — Telephone Encounter (Signed)
Left message @ 4:46 pm. JSY 

## 2020-01-11 NOTE — Telephone Encounter (Signed)
Pt spouse would like someone to review lab results with him and patient

## 2020-01-12 ENCOUNTER — Encounter: Payer: Self-pay | Admitting: *Deleted

## 2020-01-12 NOTE — Telephone Encounter (Signed)
Pt saw results online. Has picked up supplies from pharmacy. Has appt with dietician on 01/26/20. Pt had no further questions. JSY

## 2020-01-19 ENCOUNTER — Encounter: Payer: Self-pay | Admitting: Obstetrics and Gynecology

## 2020-01-19 ENCOUNTER — Encounter: Payer: Self-pay | Admitting: Obstetrics & Gynecology

## 2020-01-20 ENCOUNTER — Telehealth: Payer: Self-pay | Admitting: Women's Health

## 2020-01-20 ENCOUNTER — Telehealth: Payer: Self-pay | Admitting: *Deleted

## 2020-01-20 ENCOUNTER — Other Ambulatory Visit: Payer: Self-pay

## 2020-01-20 ENCOUNTER — Encounter (HOSPITAL_COMMUNITY): Payer: Self-pay | Admitting: Family Medicine

## 2020-01-20 ENCOUNTER — Inpatient Hospital Stay (HOSPITAL_COMMUNITY)
Admission: AD | Admit: 2020-01-20 | Discharge: 2020-01-20 | Disposition: A | Discharge status: Home / Self Care | Payer: Medicaid - Out of State | Attending: Family Medicine | Admitting: Family Medicine

## 2020-01-20 DIAGNOSIS — R109 Unspecified abdominal pain: Secondary | ICD-10-CM | POA: Diagnosis not present

## 2020-01-20 DIAGNOSIS — O361930 Maternal care for other isoimmunization, third trimester, not applicable or unspecified: Secondary | ICD-10-CM

## 2020-01-20 DIAGNOSIS — Z98891 History of uterine scar from previous surgery: Secondary | ICD-10-CM

## 2020-01-20 DIAGNOSIS — Z833 Family history of diabetes mellitus: Secondary | ICD-10-CM | POA: Insufficient documentation

## 2020-01-20 DIAGNOSIS — O209 Hemorrhage in early pregnancy, unspecified: Secondary | ICD-10-CM

## 2020-01-20 DIAGNOSIS — O0991 Supervision of high risk pregnancy, unspecified, first trimester: Secondary | ICD-10-CM | POA: Diagnosis not present

## 2020-01-20 DIAGNOSIS — Z6790 Unspecified blood type, Rh positive: Secondary | ICD-10-CM | POA: Diagnosis not present

## 2020-01-20 DIAGNOSIS — Z3A12 12 weeks gestation of pregnancy: Secondary | ICD-10-CM | POA: Diagnosis not present

## 2020-01-20 DIAGNOSIS — Z7982 Long term (current) use of aspirin: Secondary | ICD-10-CM | POA: Insufficient documentation

## 2020-01-20 DIAGNOSIS — Z8249 Family history of ischemic heart disease and other diseases of the circulatory system: Secondary | ICD-10-CM | POA: Diagnosis not present

## 2020-01-20 DIAGNOSIS — O24111 Pre-existing diabetes mellitus, type 2, in pregnancy, first trimester: Secondary | ICD-10-CM | POA: Diagnosis not present

## 2020-01-20 DIAGNOSIS — M549 Dorsalgia, unspecified: Secondary | ICD-10-CM | POA: Insufficient documentation

## 2020-01-20 DIAGNOSIS — Z7984 Long term (current) use of oral hypoglycemic drugs: Secondary | ICD-10-CM | POA: Diagnosis not present

## 2020-01-20 DIAGNOSIS — E119 Type 2 diabetes mellitus without complications: Secondary | ICD-10-CM

## 2020-01-20 DIAGNOSIS — O26891 Other specified pregnancy related conditions, first trimester: Secondary | ICD-10-CM | POA: Diagnosis not present

## 2020-01-20 DIAGNOSIS — O34219 Maternal care for unspecified type scar from previous cesarean delivery: Secondary | ICD-10-CM | POA: Diagnosis not present

## 2020-01-20 DIAGNOSIS — O26892 Other specified pregnancy related conditions, second trimester: Secondary | ICD-10-CM

## 2020-01-20 DIAGNOSIS — O4690 Antepartum hemorrhage, unspecified, unspecified trimester: Secondary | ICD-10-CM

## 2020-01-20 LAB — URINALYSIS, ROUTINE W REFLEX MICROSCOPIC
Bacteria, UA: NONE SEEN
Bilirubin Urine: NEGATIVE
Glucose, UA: 50 mg/dL — AB
Ketones, ur: NEGATIVE mg/dL
Leukocytes,Ua: NEGATIVE
Nitrite: NEGATIVE
Protein, ur: NEGATIVE mg/dL
Specific Gravity, Urine: 1.005 (ref 1.005–1.030)
pH: 6 (ref 5.0–8.0)

## 2020-01-20 NOTE — Telephone Encounter (Signed)
Patient was seen in MAU this morning and had a bedside US that showed a [facility]. Patient stated that she was told to call us and make an appointment. Her next visit is on 8/23. Advised patient that we would have provider review her MAU note and advise if appt should be prior to 8/23.

## 2020-01-20 NOTE — MAU Provider Note (Signed)
History     CSN: [id number]  Arrival date and time: 01/20/20 1029   First Provider Initiated Contact with Patient 01/20/20 1131      Chief Complaint  Patient presents with  . Vaginal Bleeding  . Abdominal Pain  . Back Pain   [name] is a 34 y.o. G2P1001 at 91w3dwho presents to MAU for vaginal bleeding which began this morning. Patient reports yesterday she had "really bad cramping" on her left side, which caused her to stop what she was doing. Patient reports she laid down and was able to go to sleep. Patient reports only light cramping at this time, similar to when she is about to start her period. Patient reports the cramping is intermittent. Patient reports she went to use the bathroom this morning and saw a small spot of brown discharge present in her underwear. Patient reports she saw the discharge again when using the bathroom in MAU, but only when wiping.  Passing blood clots? no Blood soaking clothes? no Lightheaded/dizzy? no Significant pelvic pain or cramping? Per above Passed any tissue? no Hx ectopic pregnancy? no  Current pregnancy problems? Type II DM, hx C/S Blood Type? O Positive Allergies? NKDA Current medications? Glyburide, PNV Current PNC & next appt? [facility], 02/06/2020  Pt denies vaginal discharge/odor/itching. Pt denies N/V, abdominal pain, constipation, diarrhea, or urinary problems. Pt denies fever, chills, fatigue, sweating or changes in appetite. Pt denies SOB or chest pain. Pt denies dizziness, HA, light-headedness, weakness.   OB History    Gravida  2   Para  1   Term  1   Preterm      AB      Living  1     SAB      TAB      Ectopic      Multiple  0   Live Births  1           Past Medical History:  Diagnosis Date  . Diabetes mellitus without complication ([place]   . Gall stones    during 1st trimester of pregnancy  . Gestational diabetes     Past Surgical History:  Procedure Laterality Date  . CESAREAN  SECTION N/A 05/14/2014   Procedure: CESAREAN SECTION;  Surgeon: UOsborne Oman MD;  [facility];  Service: Obstetrics;  Laterality: N/A;    Family History  Problem Relation Age of Onset  . Hypertension Mother   . Diabetes Mother   . Diabetes Father   . Diabetes Paternal Uncle   . Hypertension Maternal Grandmother     Social History   Tobacco Use  . Smoking status: Never Smoker  . Smokeless tobacco: Never Used  Vaping Use  . Vaping Use: Never used  Substance Use Topics  . Alcohol use: No  . Drug use: No    Allergies: No Known Allergies  Medications Prior to Admission  Medication Sig Dispense Refill Last Dose  . glyBURIDE (DIABETA) 2.5 MG tablet Take 1 tablet (2.5 mg total) by mouth at bedtime. 60 tablet 6 01/19/2020 at 2300  . Prenatal Vit-Fe Fumarate-FA (PRENATAL VITAMINS) 28-0.8 MG TABS Take by mouth.   01/20/2020 at 0900  . Accu-Chek Softclix Lancets lancets Use as instructed to check blood sugar 4 times daily 100 each 12   . aspirin 81 MG chewable tablet Chew 2 tablets (162 mg total) by mouth daily. 60 tablet 8   . Blood Glucose Monitoring Suppl (ACCU-CHEK GUIDE ME) w/Device KIT 1 each by Does not  apply route 4 (four) times daily. 1 kit 0   . Blood Pressure Monitor MISC For regular home bp monitoring during pregnancy 1 each 0   . glucose blood (ACCU-CHEK GUIDE) test strip Use as instructed to check blood sugar 4 times daily 50 each 12     Review of Systems  Constitutional: Negative for chills, diaphoresis, fatigue and fever.  Eyes: Negative for visual disturbance.  Respiratory: Negative for shortness of breath.   Cardiovascular: Negative for chest pain.  Gastrointestinal: Negative for abdominal pain, constipation, diarrhea, nausea and vomiting.  Genitourinary: Positive for pelvic pain and vaginal bleeding. Negative for dysuria, flank pain, frequency, urgency and vaginal discharge.  Neurological: Negative for dizziness, weakness, light-headedness and headaches.    Physical Exam   Blood pressure 123/73, pulse 74, temperature 98.5 F (36.9 C), temperature source Oral, resp. rate 16, height _0  (1.575 m), weight 102.5 kg, last menstrual period 09/03/2019, SpO2 100 %, currently breastfeeding.  Patient Vitals for the past 24 hrs:  BP Temp Temp src Pulse Resp SpO2 Height Weight  01/20/20 1040 123/73 98.5 F (36.9 C) Oral 74 16 100 % _1  (1.575 m) 102.5 kg   Physical Exam Vitals and nursing note reviewed. Exam conducted with a chaperone present.  Constitutional:      General: She is not in acute distress.    Appearance: Normal appearance. She is not ill-appearing, toxic-appearing or diaphoretic.  HENT:     Head: Normocephalic and atraumatic.  Pulmonary:     Effort: Pulmonary effort is normal.  Abdominal:     Palpations: Abdomen is soft.  Genitourinary:    General: Normal vulva.     Labia:        Right: No rash, tenderness or lesion.        Left: No rash, tenderness or lesion.      Comments: Dilation: Closed Effacement (%): Thick Cervical Position: Posterior Exam by:: N. Lerline Valdivia, NP Skin:    General: Skin is warm and dry.  Neurological:     Mental Status: She is alert and oriented to person, place, and time.  Psychiatric:        Mood and Affect: Mood normal.        Behavior: Behavior normal.        Thought Content: Thought content normal.        Judgment: Judgment normal.    US OB Comp Less 14 Wks  Result Date: 12/28/2019 CLINICAL DATA:  Pregnant, abdominal pain EXAM: OBSTETRIC <14 WK [place] AND TRANSVAGINAL OB US TECHNIQUE: Both transabdominal and transvaginal ultrasound examinations were performed for complete evaluation of the gestation as well as the maternal uterus, adnexal regions, and pelvic cul-de-sac. Transvaginal technique was performed to assess early pregnancy. COMPARISON:  None. FINDINGS: Intrauterine gestational sac: Present, single Yolk sac:  Single, normal-appearing Embryo:  Single Cardiac Activity: Present Heart Rate: 164  bpm MSD: Appropriate in relation to fetal size CRL:  24 mm mm   9 w   1 d                  [place] EDC: 07/31/2020 Subchorionic hemorrhage:  None visualized. Maternal uterus/adnexae: The uterus is anteverted. No intrauterine masses are seen. The placenta is not well visualized given the early phase of gestation. The cervix is closed and demonstrates a cervical length of approximately 3.9 cm. No free fluid is seen within the cul-de-sac. The maternal ovaries are normal in size and echogenicity bilaterally. No adnexal masses are seen. IMPRESSION: Single living intrauterine  gestation with an estimated gestational age of [redacted] weeks, 1 day. Estimated date of delivery is 07/31/2020 Electronically Signed   By: Fidela Salisbury MD   On: 12/28/2019 17:00   US OB Transvaginal  Result Date: 12/28/2019 CLINICAL DATA:  Pregnant, abdominal pain EXAM: OBSTETRIC <14 WK [place] AND TRANSVAGINAL OB US TECHNIQUE: Both transabdominal and transvaginal ultrasound examinations were performed for complete evaluation of the gestation as well as the maternal uterus, adnexal regions, and pelvic cul-de-sac. Transvaginal technique was performed to assess early pregnancy. COMPARISON:  None. FINDINGS: Intrauterine gestational sac: Present, single Yolk sac:  Single, normal-appearing Embryo:  Single Cardiac Activity: Present Heart Rate: 164 bpm MSD: Appropriate in relation to fetal size CRL:  24 mm mm   9 w   1 d                  [place] EDC: 07/31/2020 Subchorionic hemorrhage:  None visualized. Maternal uterus/adnexae: The uterus is anteverted. No intrauterine masses are seen. The placenta is not well visualized given the early phase of gestation. The cervix is closed and demonstrates a cervical length of approximately 3.9 cm. No free fluid is seen within the cul-de-sac. The maternal ovaries are normal in size and echogenicity bilaterally. No adnexal masses are seen. IMPRESSION: Single living intrauterine gestation with an estimated gestational age of [redacted] weeks, 1  day. Estimated date of delivery is 07/31/2020 Electronically Signed   By: Fidela Salisbury MD   On: 12/28/2019 17:00   MAU Course  Procedures  MDM -VB and cramping with small amount of dark brown discharge starting this morning and progressively improving over the past few hours -previously confirmed IUP on [place] at [facility] -RH positive -Dilation: Closed Effacement (%): Thick Cervical Position: Posterior Exam by:: N. Khristy Kalan, NP -Pt informed that the ultrasound is considered a limited OB ultrasound and is not intended to be a complete ultrasound exam.  Patient also informed that the ultrasound is not being completed with the intent of assessing for fetal or placental anomalies or any pelvic abnormalities.  Explained that the purpose of today's ultrasound is to assess for  viability (FHR 152).  Patient acknowledges the purpose of the exam and the limitations of the study. -WetPrep/GC/CT negative within past 3 weeks, pt and husband report no concern for new infection -pt discharged home in stable condition  Orders Placed This Encounter  Procedures  . Culture, OB Urine    Standing Status:   Standing    Number of Occurrences:   1  . Urinalysis, Routine w reflex microscopic Urine, Clean Catch    Standing Status:   Standing    Number of Occurrences:   1  . Discharge patient    Order Specific Question:   Discharge disposition    Answer:   01-Home or Self Care [1]    Order Specific Question:   Discharge patient date    Answer:   01/20/2020   No orders of the defined types were placed in this encounter.   Assessment and Plan   1. Vaginal bleeding in pregnancy   2. Pregnancy with type 2 diabetes mellitus in first trimester   3. History of cesarean delivery   4. Supervision of high risk pregnancy in first trimester   5. Blood type, Rh positive   6. [redacted] weeks gestation of pregnancy     Allergies as of 01/20/2020   No Known Allergies     Medication List    TAKE these medications   Accu-Chek  Guide  Me w/Device Kit 1 each by Does not apply route 4 (four) times daily.   Accu-Chek Guide test strip Generic drug: glucose blood Use as instructed to check blood sugar 4 times daily   Accu-Chek Softclix Lancets lancets Use as instructed to check blood sugar 4 times daily   aspirin 81 MG chewable tablet Chew 2 tablets (162 mg total) by mouth daily.   Blood Pressure Monitor Misc For regular home bp monitoring during pregnancy   glyBURIDE 2.5 MG tablet Commonly known as: DIABETA Take 1 tablet (2.5 mg total) by mouth at bedtime.   Prenatal Vitamins 28-0.8 MG Tabs Take by mouth.       -will call with culture results, if positive -pt informed it may not always be possible to ensure a female provider, especially in the hospital setting, pt and husband verbalizes understanding -discussed s/sx of [facility] and expectations -return MAU precautions given -pt discharged to home in stable condition  Elmyra Ricks E Fenris Cauble 01/20/2020, 12:09 PM

## 2020-01-20 NOTE — MAU Note (Signed)
Had bad cramps last night.  When she woke up this morning, her underwear was full of blood, still cramping and having lower back pain.

## 2020-01-20 NOTE — Telephone Encounter (Signed)
Patient's husband called stating that the patient has woken up with bleeding she is 12 wks and 3 days. I tried to offer an appointment today at 10:10 am with Dr. Despina Hidden, patient's husband refused because it was a female provider and wanted a female. I informed the patient's husband that I do not have any opening with a female provider today and pt\'s husband stated that he is just going to take her to [facility] hospital and hung up.

## 2020-01-20 NOTE — Discharge Instructions (Signed)
Subchorionic Hematoma  A subchorionic hematoma is a gathering of blood between the outer wall of the embryo (chorion) and the inner wall of the womb (uterus). This condition can cause vaginal bleeding. If they cause little or no vaginal bleeding, early small hematomas usually shrink on their own and do not affect your baby or pregnancy. When bleeding starts later in pregnancy, or if the hematoma is larger or occurs in older pregnant women, the condition may be more serious. Larger hematomas may get bigger, which increases the chances of miscarriage. This condition also increases the risk of:  Premature separation of the placenta from the uterus.  Premature (preterm) labor.  Stillbirth. What are the causes? The exact cause of this condition is not known. It occurs when blood is trapped between the placenta and the uterine wall because the placenta has separated from the original site of implantation. What increases the risk? You are more likely to develop this condition if:  You were treated with fertility medicines.  You conceived through in vitro fertilization (IVF). What are the signs or symptoms? Symptoms of this condition include:  Vaginal spotting or bleeding.  Contractions of the uterus. These cause abdominal pain. Sometimes you may have no symptoms and the bleeding may only be seen when ultrasound images are taken (transvaginal ultrasound). How is this diagnosed? This condition is diagnosed based on a physical exam. This includes a pelvic exam. You may also have other tests, including:  Blood tests.  Urine tests.  Ultrasound of the abdomen. How is this treated? Treatment for this condition can vary. Treatment may include:  Watchful waiting. You will be monitored closely for any changes in bleeding. During this stage: ? The hematoma may be reabsorbed by the body. ? The hematoma may separate the fluid-filled space containing the embryo (gestational sac) from the wall of the  womb (endometrium).  Medicines.  Activity restriction. This may be needed until the bleeding stops. Follow these instructions at home:  Stay on bed rest if told to do so by your health care provider.  Do not lift anything that is heavier than 10 lbs. (4.5 kg) or as told by your health care provider.  Do not use any products that contain nicotine or tobacco, such as cigarettes and e-cigarettes. If you need help quitting, ask your health care provider.  Track and write down the number of pads you use each day and how soaked (saturated) they are.  Do not use tampons.  Keep all follow-up visits as told by your health care provider. This is important. Your health care provider may ask you to have follow-up blood tests or ultrasound tests or both. Contact a health care provider if:  You have any vaginal bleeding.  You have a fever. Get help right away if:  You have severe cramps in your stomach, back, abdomen, or pelvis.  You pass large clots or tissue. Save any tissue for your health care provider to look at.  You have more vaginal bleeding, and you faint or become lightheaded or weak. Summary  A subchorionic hematoma is a gathering of blood between the outer wall of the placenta and the uterus.  This condition can cause vaginal bleeding.  Sometimes you may have no symptoms and the bleeding may only be seen when ultrasound images are taken.  Treatment may include watchful waiting, medicines, or activity restriction. This information is not intended to replace advice given to you by your health care provider. Make sure you discuss any questions you   have with your health care provider. Document Revised: 05/15/2017 Document Reviewed: 07/29/2016 Elsevier Patient Education  2020 [employer] Inc.        Vaginal Bleeding During Pregnancy, First Trimester  A small amount of bleeding from the vagina (spotting) is relatively common during early pregnancy. It usually stops on its  own. Various things may cause bleeding or spotting during early pregnancy. Some bleeding may be related to the pregnancy, and some may not. In many cases, the bleeding is normal and is not a problem. However, bleeding can also be a sign of something serious. Be sure to tell your health care provider about any vaginal bleeding right away. Some possible causes of vaginal bleeding during the first trimester include:  Infection or inflammation of the cervix.  Growths (polyps) on the cervix.  Miscarriage or threatened miscarriage.  Pregnancy tissue developing outside of the uterus (ectopic pregnancy).  A mass of tissue developing in the uterus due to an egg being fertilized incorrectly (molar pregnancy). Follow these instructions at home: Activity  Follow instructions from your health care provider about limiting your activity. Ask what activities are safe for you.  If needed, make plans for someone to help with your regular activities.  Do not have sex or orgasms until your health care provider says that this is safe. General instructions  Take over-the-counter and prescription medicines only as told by your health care provider.  Pay attention to any changes in your symptoms.  Do not use tampons or douche.  Write down how many pads you use each day, how often you change pads, and how soaked (saturated) they are.  If you pass any tissue from your vagina, save the tissue so you can show it to your health care provider.  Keep all follow-up visits as told by your health care provider. This is important. Contact a health care provider if:  You have vaginal bleeding during any part of your pregnancy.  You have cramps or labor pains.  You have a fever. Get help right away if:  You have severe cramps in your back or abdomen.  You pass large clots or a large amount of tissue from your vagina.  Your bleeding increases.  You feel light-headed or weak, or you faint.  You have  chills.  You are leaking fluid or have a gush of fluid from your vagina. Summary  A small amount of bleeding (spotting) from the vagina is relatively common during early pregnancy.  Various things may cause bleeding or spotting in early pregnancy.  Be sure to tell your health care provider about any vaginal bleeding right away. This information is not intended to replace advice given to you by your health care provider. Make sure you discuss any questions you have with your health care provider. Document Revised: 09/21/2018 Document Reviewed: 09/04/2016 Elsevier Patient Education  2020 [employer].

## 2020-01-20 NOTE — Telephone Encounter (Signed)
I have reviewed her chart and keeping her appt is fine  She does not need an earlier one unless something changes

## 2020-01-21 LAB — CULTURE, OB URINE: Culture: <10000 — AB

## 2020-01-23 ENCOUNTER — Telehealth: Payer: Self-pay | Admitting: Obstetrics & Gynecology

## 2020-01-23 NOTE — Telephone Encounter (Signed)
Patient spouse called and questioned how the patient was dx with Subchorionic Hematoma when the patient did not have ultrasound.

## 2020-01-24 ENCOUNTER — Telehealth: Payer: Self-pay | Admitting: Obstetrics & Gynecology

## 2020-01-24 NOTE — Telephone Encounter (Signed)
Pt is 12 weeks/ having pain and father of baby concerned and wanted a nurse to call

## 2020-01-24 NOTE — Telephone Encounter (Signed)
Telephoned patient at home number and left message to return call.  

## 2020-01-26 ENCOUNTER — Other Ambulatory Visit: Payer: Self-pay

## 2020-02-06 ENCOUNTER — Ambulatory Visit (INDEPENDENT_AMBULATORY_CARE_PROVIDER_SITE_OTHER): Payer: Medicaid - Out of State | Admitting: Women's Health

## 2020-02-06 ENCOUNTER — Encounter: Payer: Self-pay | Admitting: Women's Health

## 2020-02-06 ENCOUNTER — Encounter: Payer: Self-pay | Admitting: Obstetrics & Gynecology

## 2020-02-06 VITALS — BP 98/70 | HR 81 | Wt 230.0 lb

## 2020-02-06 DIAGNOSIS — O0992 Supervision of high risk pregnancy, unspecified, second trimester: Secondary | ICD-10-CM

## 2020-02-06 DIAGNOSIS — Z3A14 14 weeks gestation of pregnancy: Secondary | ICD-10-CM | POA: Diagnosis not present

## 2020-02-06 DIAGNOSIS — O24112 Pre-existing diabetes mellitus, type 2, in pregnancy, second trimester: Secondary | ICD-10-CM

## 2020-02-06 DIAGNOSIS — O34219 Maternal care for unspecified type scar from previous cesarean delivery: Secondary | ICD-10-CM

## 2020-02-06 DIAGNOSIS — Z331 Pregnant state, incidental: Secondary | ICD-10-CM

## 2020-02-06 DIAGNOSIS — E119 Type 2 diabetes mellitus without complications: Secondary | ICD-10-CM

## 2020-02-06 DIAGNOSIS — Z1389 Encounter for screening for other disorder: Secondary | ICD-10-CM

## 2020-02-06 DIAGNOSIS — Z98891 History of uterine scar from previous surgery: Secondary | ICD-10-CM

## 2020-02-06 LAB — POCT URINALYSIS DIPSTICK OB
Blood, UA: NEGATIVE
Glucose, UA: NEGATIVE
Ketones, UA: NEGATIVE
Leukocytes, UA: NEGATIVE
Nitrite, UA: NEGATIVE
POC,PROTEIN,UA: NEGATIVE

## 2020-02-06 MED ORDER — GLYBURIDE 5 MG PO TABS: 5.0000 mg | ORAL_TABLET | Freq: Two times a day (BID) | ORAL | 3 refills | Status: DC

## 2020-02-06 NOTE — Progress Notes (Addendum)
HIGH-RISK PREGNANCY VISIT Patient name: Dan Dissinger MRN [id number]  Date of birth: March 20, 1986 Chief Complaint:   High Risk Gestation  History of Present Illness:   April [name] is a 34 y.o. G91P1001 female at [redacted]w[redacted]d with an Estimated Date of Delivery: 07/31/20 being seen today for ongoing management of a high-risk pregnancy complicated by diabetes mellitus T2DM currently on glyburide 2.5mg  qhs.  Today she reports FBS 98-144, 2hr pp 126-180. Has appt w/ dietician 8/31. A1C @ 10wks was 9.4. 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 Boil Lt lower abdomen, slow to heal.  Depression screen [facility] 2/9 01/09/2020  Decreased Interest 2  Down, Depressed, Hopeless 1  PHQ - 2 Score 3  Altered sleeping 3  Tired, decreased energy 3  Change in appetite 0  Feeling bad or failure about yourself  1  Trouble concentrating 1  Moving slowly or fidgety/restless 0  Suicidal thoughts 0  PHQ-9 Score 11    Contractions: Not present. Vag. Bleeding: None.  Movement: Present. denies leaking of fluid.  Review of Systems:   Pertinent items are noted in HPI Denies abnormal vaginal discharge w/ itching/odor/irritation, headaches, visual changes, shortness of breath, chest pain, abdominal pain, severe nausea/vomiting, or problems with urination or bowel movements unless otherwise stated above. Pertinent History Reviewed:  Reviewed past medical,surgical, social, obstetrical and family history.  Reviewed problem list, medications and allergies. Physical Assessment:   Vitals:   02/06/20 1609  BP: 98/70  Pulse: 81  Weight: 230 lb (104.3 kg)  Body mass index is 42.07 kg/m.           Physical Examination:   General appearance: alert, well appearing, and in no distress  Mental status: alert, oriented to person, place, and time  Skin: warm & dry   Extremities: Edema: Trace    Cardiovascular: normal heart rate  noted  Respiratory: normal respiratory effort, no distress  Abdomen: gravid, soft, non-tender, healing flat boil Lt lower abd, 3 ulcerated areas in center, no s/s infection  Pelvic: Cervical exam deferred         Fetal Status: Fetal Heart Rate (bpm): 147   Movement: Present    Fetal Surveillance Testing today: doppler   Chaperone: n/a    Results for orders placed or performed in visit on 02/06/20 (from the past 24 hour(s))  POC Urinalysis Dipstick OB   Collection Time: 02/06/20  4:10 PM  Result Value Ref Range   Color, UA     Clarity, UA     Glucose, UA Negative Negative   Bilirubin, UA     Ketones, UA neg    Spec Grav, UA     Blood, UA neg    pH, UA     POC,PROTEIN,UA Negative Negative, Trace, Small (1+), Moderate (2+), Large (3+), 4+   Urobilinogen, UA     Nitrite, UA neg    Leukocytes, UA Negative Negative   Appearance     Odor      Assessment & Plan:  1) High-risk pregnancy G2P1001 at [redacted]w[redacted]d with an Estimated Date of Delivery: 07/31/20   2) T2DM, unstable, discussed w/ [facility], d/t ?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 glyBURIDE (DIABETA) 5 MG tablet    Sig: Take 1 tablet (5 mg total) by mouth 2 (two) times daily with a meal.    Dispense:  60 tablet    Refill:  3    Order Specific Question:   Supervising Provider    Answer:   Duane Lope H [[id number]]    Labs/procedures today: none  Treatment Plan:  If remains uncontrolled: Growth u/s @ 20, 24, 27, 30, 33, 36wks     Fetal echo 24-28wks:____   BPP weekly @ 28wks then 2x/wk testing @ 32wks or weekly BPP     Deliver 36wks or PRN:_____   Reviewed: Preterm labor symptoms and general obstetric precautions including but not limited to vaginal  bleeding, contractions, leaking of fluid and fetal movement were reviewed in detail with the patient.  All questions were answered.   Follow-up: Return for 2wks HROB in person, then 4wks HROB in person w/ anatomy u/s and pap; get records from [facility].  Orders Placed This Encounter  Procedures  . POC Urinalysis Dipstick OB   Cheral Marker CNM, [employer] [facility] 02/06/2020 4:59 PM

## 2020-02-06 NOTE — Patient Instructions (Signed)
Cecille Po, I greatly value your feedback.  If you receive a survey following your visit with [place] today, we appreciate you taking the time to fill it out.  Thanks, [name], CNM, WHNP-BC  [facility] ([address] [place], [place] [address]) Entrance C, located off of [address] valet parking  Go to [email or web address] to register for FREE online childbirth classes  [place] Pediatricians/Family Doctors:  [facility] [phone]            [facility] [phone]                 [facility] [phone] (usually not accepting new patients unless you have family there already, you are always welcome to call and ask)       [facility] Department [phone]       [facility] Pediatricians/Family Doctors:   [facility]: [phone]  [facility]: [phone]  [facility]: [phone]  [facility] Doctors:   [facility]: [phone]   [facility]: [phone]  [facility] Doctors:  [facility]: [phone]    Home Blood Pressure Monitoring for Patients   Your provider has recommended that you check your blood pressure (BP) at least once a week at home. If you do not have a blood pressure cuff at home, one will be provided for you. Contact your provider if you have not received your monitor within 1 week.   Helpful Tips for Accurate Home Blood Pressure Checks  . Don't smoke, exercise, or drink caffeine 30 minutes before checking your BP . Use the restroom before checking your BP (a full bladder can raise your pressure) . Relax in a comfortable upright chair . Feet on the ground . Left arm resting comfortably on a flat surface at the level of your heart . Legs uncrossed . Back supported . Sit quietly and don't talk . Place the cuff on your bare arm . Adjust snuggly, so  that only two fingertips can fit between your skin and the top of the cuff . Check 2 readings separated by at least one minute . Keep a log of your BP readings . For a visual, please reference this diagram: http://ccnc.care/bpdiagram  Provider Name: [facility]     Phone: [phone]  Zone 1: ALL CLEAR  Continue to monitor your symptoms:  . BP reading is less than 140 (top number) or less than 90 (bottom number)  . No right upper stomach pain . No headaches or seeing spots . No feeling nauseated or throwing up . No swelling in face and hands  Zone 2: CAUTION Call your doctor's office for any of the following:  . BP reading is greater than 140 (top number) or greater than 90 (bottom number)  . Stomach pain under your ribs in the middle or right side . Headaches or seeing spots . Feeling nauseated or throwing up . Swelling in face and hands  Zone 3: EMERGENCY  Seek immediate medical care if you have any of the following:  . BP reading is greater than160 (top number) or greater than 110 (bottom number) . Severe headaches not improving with Tylenol . Serious difficulty catching your breath . Any worsening symptoms from Zone 2     Second Trimester of Pregnancy The second trimester is from week 14 through week 27 (months 4 through 6). The second trimester is often a time when you feel your best.  Your body has adjusted to being pregnant, and you begin to feel better physically. Usually, morning sickness has lessened or quit completely, you may have more energy, and you may have an increase in appetite. The second trimester is also a time when the fetus is growing rapidly. At the end of the sixth month, the fetus is about 9 inches long and weighs about 1 pounds. You will likely begin to feel the baby move (quickening) between 16 and 20 weeks of pregnancy. Body changes during your second trimester Your body continues to go through many changes during your second trimester. The  changes vary from woman to woman.  Your weight will continue to increase. You will notice your lower abdomen bulging out.  You may begin to get stretch marks on your hips, abdomen, and breasts.  You may develop headaches that can be relieved by medicines. The medicines should be approved by your health care provider.  You may urinate more often because the fetus is pressing on your bladder.  You may develop or continue to have heartburn as a result of your pregnancy.  You may develop constipation because certain hormones are causing the muscles that push waste through your intestines to slow down.  You may develop hemorrhoids or swollen, bulging veins (varicose veins).  You may have back pain. This is caused by: ? Weight gain. ? Pregnancy hormones that are relaxing the joints in your pelvis. ? A shift in weight and the muscles that support your balance.  Your breasts will continue to grow and they will continue to become tender.  Your gums may bleed and may be sensitive to brushing and flossing.  Dark spots or blotches (chloasma, mask of pregnancy) may develop on your face. This will likely fade after the baby is born.  A dark line from your belly button to the pubic area (linea nigra) may appear. This will likely fade after the baby is born.  You may have changes in your hair. These can include thickening of your hair, rapid growth, and changes in texture. Some women also have hair loss during or after pregnancy, or hair that feels dry or thin. Your hair will most likely return to normal after your baby is born.  What to expect at prenatal visits During a routine prenatal visit:  You will be weighed to make sure you and the fetus are growing normally.  Your blood pressure will be taken.  Your abdomen will be measured to track your baby's growth.  The fetal heartbeat will be listened to.  Any test results from the previous visit will be discussed.  Your health care  provider may ask you:  How you are feeling.  If you are feeling the baby move.  If you have had any abnormal symptoms, such as leaking fluid, bleeding, severe headaches, or abdominal cramping.  If you are using any tobacco products, including cigarettes, chewing tobacco, and electronic cigarettes.  If you have any questions.  Other tests that may be performed during your second trimester include:  Blood tests that check for: ? Low iron levels (anemia). ? High blood sugar that affects pregnant women (gestational diabetes) between 64 and 28 weeks. ? Rh antibodies. This is to check for a protein on red blood cells (Rh factor).  Urine tests to check for infections, diabetes, or protein in the urine.  An ultrasound to confirm the proper growth and development of the baby.  An amniocentesis to check for possible genetic problems.  Fetal screens  for spina bifida and Down syndrome.  HIV (human immunodeficiency virus) testing. Routine prenatal testing includes screening for HIV, unless you choose not to have this test.  Follow these instructions at home: Medicines  Follow your health care provider's instructions regarding medicine use. Specific medicines may be either safe or unsafe to take during pregnancy.  Take a prenatal vitamin that contains at least 600 micrograms (mcg) of folic acid.  If you develop constipation, try taking a stool softener if your health care provider approves. Eating and drinking  Eat a balanced diet that includes fresh fruits and vegetables, whole grains, good sources of protein such as meat, eggs, or tofu, and low-fat dairy. Your health care provider will help you determine the amount of weight gain that is right for you.  Avoid raw meat and uncooked cheese. These carry germs that can cause birth defects in the baby.  If you have low calcium intake from food, talk to your health care provider about whether you should take a daily calcium supplement.   Limit foods that are high in fat and processed sugars, such as fried and sweet foods.  To prevent constipation: ? Drink enough fluid to keep your urine clear or pale yellow. ? Eat foods that are high in fiber, such as fresh fruits and vegetables, whole grains, and beans. Activity  Exercise only as directed by your health care provider. Most women can continue their usual exercise routine during pregnancy. Try to exercise for 30 minutes at least 5 days a week. Stop exercising if you experience uterine contractions.  Avoid heavy lifting, wear low heel shoes, and practice good posture.  A sexual relationship may be continued unless your health care provider directs you otherwise. Relieving pain and discomfort  Wear a good support bra to prevent discomfort from breast tenderness.  Take warm sitz baths to soothe any pain or discomfort caused by hemorrhoids. Use hemorrhoid cream if your health care provider approves.  Rest with your legs elevated if you have leg cramps or low back pain.  If you develop varicose veins, wear support hose. Elevate your feet for 15 minutes, 3-4 times a day. Limit salt in your diet. Prenatal Care  Write down your questions. Take them to your prenatal visits.  Keep all your prenatal visits as told by your health care provider. This is important. Safety  Wear your seat belt at all times when driving.  Make a list of emergency phone numbers, including numbers for family, friends, the hospital, and police and fire departments. General instructions  Ask your health care provider for a referral to a local prenatal education class. Begin classes no later than the beginning of month 6 of your pregnancy.  Ask for help if you have counseling or nutritional needs during pregnancy. Your health care provider can offer advice or refer you to specialists for help with various needs.  Do not use hot tubs, steam rooms, or saunas.  Do not douche or use tampons or scented  sanitary pads.  Do not cross your legs for long periods of time.  Avoid cat litter boxes and soil used by cats. These carry germs that can cause birth defects in the baby and possibly loss of the fetus by miscarriage or stillbirth.  Avoid all smoking, herbs, alcohol, and unprescribed drugs. Chemicals in these products can affect the formation and growth of the baby.  Do not use any products that contain nicotine or tobacco, such as cigarettes and e-cigarettes. If you need help quitting,  ask your health care provider.  Visit your dentist if you have not gone yet during your pregnancy. Use a soft toothbrush to brush your teeth and be gentle when you floss. Contact a health care provider if:  You have dizziness.  You have mild pelvic cramps, pelvic pressure, or nagging pain in the abdominal area.  You have persistent nausea, vomiting, or diarrhea.  You have a bad smelling vaginal discharge.  You have pain when you urinate. Get help right away if:  You have a fever.  You are leaking fluid from your vagina.  You have spotting or bleeding from your vagina.  You have severe abdominal cramping or pain.  You have rapid weight gain or weight loss.  You have shortness of breath with chest pain.  You notice sudden or extreme swelling of your face, hands, ankles, feet, or legs.  You have not felt your baby move in over an hour.  You have severe headaches that do not go away when you take medicine.  You have vision changes. Summary  The second trimester is from week 14 through week 27 (months 4 through 6). It is also a time when the fetus is growing rapidly.  Your body goes through many changes during pregnancy. The changes vary from woman to woman.  Avoid all smoking, herbs, alcohol, and unprescribed drugs. These chemicals affect the formation and growth your baby.  Do not use any tobacco products, such as cigarettes, chewing tobacco, and e-cigarettes. If you  need help quitting, ask your health care provider.  Contact your health care provider if you have any questions. Keep all prenatal visits as told by your health care provider. This is important. This information is not intended to replace advice given to you by your health care provider. Make sure you discuss any questions you have with your health care provider. Document Released: 05/27/2001 Document Revised: 11/08/2015 Document Reviewed: 08/03/2012 Elsevier Interactive Patient Education  2017 Elsevier Inc.   

## 2020-02-07 MED ORDER — SILVER SULFADIAZINE 1 % EX CREA: 1.0000 "application " | TOPICAL_CREAM | Freq: Every day | CUTANEOUS | 0 refills | Status: DC

## 2020-02-07 NOTE — Addendum Note (Signed)
Addended by: Cheral Marker on: 02/07/2020 10:24 AM   Modules accepted: Orders

## 2020-02-14 ENCOUNTER — Telehealth: Payer: Self-pay | Admitting: Registered"

## 2020-02-14 ENCOUNTER — Other Ambulatory Visit: Payer: Self-pay

## 2020-02-14 NOTE — Telephone Encounter (Signed)
RD called patient who missed appointment today, 8/31. LVM and offered 2 available appointment times later today and request that she call my direct line if she is able to make it in today.

## 2020-02-16 ENCOUNTER — Ambulatory Visit: Payer: Medicaid - Out of State | Admitting: Registered"

## 2020-02-16 ENCOUNTER — Telehealth: Payer: Self-pay | Admitting: Women's Health

## 2020-02-16 ENCOUNTER — Other Ambulatory Visit: Payer: Self-pay

## 2020-02-16 DIAGNOSIS — O24119 Pre-existing diabetes mellitus, type 2, in pregnancy, unspecified trimester: Secondary | ICD-10-CM

## 2020-02-16 NOTE — Telephone Encounter (Signed)
Patient spouse states that a doctor at [facility] explained that the medication glyburide is not safe during pregnancy. Would like followup regarding this medicine. Patient spouse would like call back on his number.

## 2020-02-16 NOTE — Progress Notes (Signed)
Insulin Education  Patient was seen on 02/16/20 for Insulin Education.   Current MD orders are: Glyburide 5 mg BID  Pt reports FBS is usually above normal but under 105 mg/dL 2 hours post meals averages 130-140 mg/dL  Pt was diagnosed with Type 2 Diabetes in 2017.  Patient has reduced carbohydrate intake since finding out she is pregnant due to past experience with GDM. Pt states she would like to exercise, but is on restriction until she is 20 weeks, and plans to resume exercise then.  Patient positive for food insecurity. RD provided [facility] information for benefits at [employer] and information for the [employer] book for food pantries in the area.  The following learning objectives were met by the patient during this visit:   Role of glucose and insulin.  Action of glyburide  Action of long-acting and short acting insulins  Reviewed options for insulin  Multiple Daily Injections using syringe & vial  Omnipod DASH insulin delivery system  Hypoglycemia- symptoms, causes, treatment choices  Babyscripts Record keeping and MD follow up  Patient demonstrated understanding via teach back  Patient received the following handouts:  Omnipod [place] demo with instructions  Pre-existing Diabetes in Pregnancy (for next appointment at [facility])                                        Patient to connect with [place] Representative to start the process of getting started using Omnipod DASH.  Patient will be seen 2 weeks for nutrition education at [facility].

## 2020-02-17 ENCOUNTER — Encounter: Payer: Self-pay | Admitting: Women's Health

## 2020-02-17 ENCOUNTER — Encounter: Payer: Self-pay | Admitting: Obstetrics & Gynecology

## 2020-02-17 NOTE — Telephone Encounter (Signed)
Not sure where that info came from, but glyburide is safe in pregnancy and she should restart it  thanks

## 2020-02-17 NOTE — Telephone Encounter (Signed)
Pt went to diabetes education. The provider that saw her there told her that they didn't really feel that using glyburide was safe for the baby. They had recommended changing to insulin. Advised patient not to stop any medications until she hears back from [place]. Pt and husband voiced understanding. No other questions at this time.

## 2020-02-21 ENCOUNTER — Telehealth: Payer: Self-pay | Admitting: Registered"

## 2020-02-21 NOTE — Telephone Encounter (Signed)
RD contacted patient to clarify our conversation from our visit last week, glyburide has been used for years in pregnancy, but may not be adequate to control blood sugar. RD wanted to make sure patient was not worried about taking the medication. Patient states she did not stop taking the Glyburide and continues to take as directed. Pt states her blood sugar readings are about the same (elevated). Patient has appointment at [facility] tomorrow and will take her blood sugar log.   Although RD schedule does not have any available appointment times for a couple of weeks, I asked her to let me know if she needs anything else to contact me via MyChart and I can work with her to get her in sooner.

## 2020-02-22 ENCOUNTER — Encounter: Payer: Self-pay | Admitting: Advanced Practice Midwife

## 2020-02-22 ENCOUNTER — Telehealth: Payer: Self-pay | Admitting: Advanced Practice Midwife

## 2020-02-22 ENCOUNTER — Other Ambulatory Visit: Payer: Self-pay

## 2020-02-22 ENCOUNTER — Telehealth: Payer: Self-pay | Admitting: *Deleted

## 2020-02-22 ENCOUNTER — Ambulatory Visit (INDEPENDENT_AMBULATORY_CARE_PROVIDER_SITE_OTHER): Payer: Medicaid - Out of State | Admitting: Advanced Practice Midwife

## 2020-02-22 VITALS — BP 118/70 | HR 84 | Wt 227.0 lb

## 2020-02-22 DIAGNOSIS — O24112 Pre-existing diabetes mellitus, type 2, in pregnancy, second trimester: Secondary | ICD-10-CM

## 2020-02-22 DIAGNOSIS — Z331 Pregnant state, incidental: Secondary | ICD-10-CM

## 2020-02-22 DIAGNOSIS — Z1389 Encounter for screening for other disorder: Secondary | ICD-10-CM

## 2020-02-22 DIAGNOSIS — O0992 Supervision of high risk pregnancy, unspecified, second trimester: Secondary | ICD-10-CM

## 2020-02-22 DIAGNOSIS — Z3A17 17 weeks gestation of pregnancy: Secondary | ICD-10-CM | POA: Diagnosis not present

## 2020-02-22 LAB — POCT URINALYSIS DIPSTICK OB
Blood, UA: NEGATIVE
Glucose, UA: NEGATIVE
Ketones, UA: NEGATIVE
Leukocytes, UA: NEGATIVE
Nitrite, UA: NEGATIVE
POC,PROTEIN,UA: NEGATIVE

## 2020-02-22 NOTE — Progress Notes (Signed)
HIGH-RISK PREGNANCY VISIT Patient name: April [name] MRN [id number]  Date of birth: February 05, 1986 Chief Complaint:   Routine Prenatal Visit  History of Present Illness:   April [name] is a 34 y.o. G31P1001 female at [redacted]w[redacted]d with an Estimated Date of Delivery: 07/31/20 being seen today for ongoing management of a high-risk pregnancy complicated by diabetes mellitus T2DM currently on Glyburide 5mg  bid.  Today she reports some issues w/ anorexia; is trying to get the recommended daily caloric intake the nutritionist recommended.  FBS: 97-130 2hrPPs: occ 125/127 but mostly within range  Depression screen [facility] 2/9 02/17/2020 01/09/2020  Decreased Interest 1 2  Down, Depressed, Hopeless 0 1  PHQ - 2 Score 1 3  Altered sleeping 2 3  Tired, decreased energy 1 3  Change in appetite 0 0  Feeling bad or failure about yourself  0 1  Trouble concentrating 0 1  Moving slowly or fidgety/restless 0 0  Suicidal thoughts 0 0  PHQ-9 Score 4 11    Contractions: Not present. Vag. Bleeding: None.  Movement: Present. denies leaking of fluid.  Review of Systems:   Pertinent items are noted in HPI Denies abnormal vaginal discharge w/ itching/odor/irritation, headaches, visual changes, shortness of breath, chest pain, abdominal pain, severe nausea/vomiting, or problems with urination or bowel movements unless otherwise stated above. Pertinent History Reviewed:  Reviewed past medical,surgical, social, obstetrical and family history.  Reviewed problem list, medications and allergies. Physical Assessment:   Vitals:   02/22/20 1442  BP: 118/70  Pulse: 84  Weight: 227 lb (103 kg)  Body mass index is 41.52 kg/m.           Physical Examination:   General appearance: alert, well appearing, and in no distress  Mental status: alert, oriented to person, place, and time  Skin: warm & dry   Extremities: Edema: None    Cardiovascular: normal heart rate noted  Respiratory: normal respiratory effort, no  distress  Abdomen: gravid, soft, non-tender  Pelvic: Cervical exam deferred         Fetal Status: Fetal Heart Rate (bpm): 159   Movement: Present       Results for orders placed or performed in visit on 02/22/20 (from the past 24 hour(s))  POC Urinalysis Dipstick OB   Collection Time: 02/22/20  2:47 PM  Result Value Ref Range   Color, UA     Clarity, UA     Glucose, UA Negative Negative   Bilirubin, UA     Ketones, UA neg    Spec Grav, UA     Blood, UA neg    pH, UA     POC,PROTEIN,UA Negative Negative, Trace, Small (1+), Moderate (2+), Large (3+), 4+   Urobilinogen, UA     Nitrite, UA neg    Leukocytes, UA Negative Negative   Appearance     Odor      Assessment & Plan:  1) High-risk pregnancy G2P1001 at [redacted]w[redacted]d with an Estimated Date of Delivery: 07/31/20   2) T2DM, will increase Glyburide to 7.5mg  bid per some higher fasting values (see in HPI)  3) Previous C/S, hasn't made plan for rLTCS vs VBAC yet  Meds: No orders of the defined types were placed in this encounter.   Labs/procedures today: none  Treatment Plan:  Needs echo, growth scans, and ante testing at 32wks  Reviewed: Preterm labor symptoms and general obstetric precautions including but not limited to vaginal bleeding, contractions, leaking of fluid and fetal movement were reviewed in detail with  the patient.  All questions were answered. Has home bp cuff. Check bp weekly, let us know if >140/90.   Follow-up: Return for 1-2wks HROB with anatomy u/s.  Orders Placed This Encounter  Procedures  . US OB Comp + 14 Wk  . POC Urinalysis Dipstick OB   Arabella Merles [facility] 02/22/2020 3:12 PM

## 2020-02-22 NOTE — Patient Instructions (Signed)
April [name], I greatly value your feedback.  If you receive a survey following your visit with [place] today, we appreciate you taking the time to fill it out.  Thanks, [name], CNM  [facility] ([address] [place], [place] [address]) Entrance C, located off of [address] valet parking  Go to [email or web address] to register for FREE online childbirth classes  [place] [facility]ians/Family Doctors:  [facility] [phone]            [facility] [phone]                 [facility] [phone] (usually not accepting new patients unless you have family there already, you are always welcome to call and ask)       [facility] Department [phone]       [facility] Pediatricians/Family Doctors:   [facility]: [phone]  [facility]: [phone]  [facility]: [phone]  [facility] Doctors:   [facility]: [phone]   [facility]: [phone]  [facility] Doctors:  [facility]: [phone]    Home Blood Pressure Monitoring for Patients   Your provider has recommended that you check your blood pressure (BP) at least once a week at home. If you do not have a blood pressure cuff at home, one will be provided for you. Contact your provider if you have not received your monitor within 1 week.   Helpful Tips for Accurate Home Blood Pressure Checks  . Don't smoke, exercise, or drink caffeine 30 minutes before checking your BP . Use the restroom before checking your BP (a full bladder can raise your pressure) . Relax in a comfortable upright chair . Feet on the ground . Left arm resting comfortably on a flat surface at the level of your heart . Legs uncrossed . Back supported . Sit quietly and don't talk . Place the cuff on your bare arm . Adjust snuggly, so that only  two fingertips can fit between your skin and the top of the cuff . Check 2 readings separated by at least one minute . Keep a log of your BP readings . For a visual, please reference this diagram: http://ccnc.care/bpdiagram  Provider Name: [facility]     Phone: [phone]  Zone 1: ALL CLEAR  Continue to monitor your symptoms:  . BP reading is less than 140 (top number) or less than 90 (bottom number)  . No right upper stomach pain . No headaches or seeing spots . No feeling nauseated or throwing up . No swelling in face and hands  Zone 2: CAUTION Call your doctor's office for any of the following:  . BP reading is greater than 140 (top number) or greater than 90 (bottom number)  . Stomach pain under your ribs in the middle or right side . Headaches or seeing spots . Feeling nauseated or throwing up . Swelling in face and hands  Zone 3: EMERGENCY  Seek immediate medical care if you have any of the following:  . BP reading is greater than160 (top number) or greater than 110 (bottom number) . Severe headaches not improving with Tylenol . Serious difficulty catching your breath . Any worsening symptoms from Zone 2     Second Trimester of Pregnancy The second trimester is from week 14 through week 27 (months 4 through 6). The second trimester is often a time when you feel your best. Your  body has adjusted to being pregnant, and you begin to feel better physically. Usually, morning sickness has lessened or quit completely, you may have more energy, and you may have an increase in appetite. The second trimester is also a time when the fetus is growing rapidly. At the end of the sixth month, the fetus is about 9 inches long and weighs about 1 pounds. You will likely begin to feel the baby move (quickening) between 16 and 20 weeks of pregnancy. Body changes during your second trimester Your body continues to go through many changes during your second trimester. The changes vary  from woman to woman.  Your weight will continue to increase. You will notice your lower abdomen bulging out.  You may begin to get stretch marks on your hips, abdomen, and breasts.  You may develop headaches that can be relieved by medicines. The medicines should be approved by your health care provider.  You may urinate more often because the fetus is pressing on your bladder.  You may develop or continue to have heartburn as a result of your pregnancy.  You may develop constipation because certain hormones are causing the muscles that push waste through your intestines to slow down.  You may develop hemorrhoids or swollen, bulging veins (varicose veins).  You may have back pain. This is caused by: ? Weight gain. ? Pregnancy hormones that are relaxing the joints in your pelvis. ? A shift in weight and the muscles that support your balance.  Your breasts will continue to grow and they will continue to become tender.  Your gums may bleed and may be sensitive to brushing and flossing.  Dark spots or blotches (chloasma, mask of pregnancy) may develop on your face. This will likely fade after the baby is born.  A dark line from your belly button to the pubic area (linea nigra) may appear. This will likely fade after the baby is born.  You may have changes in your hair. These can include thickening of your hair, rapid growth, and changes in texture. Some women also have hair loss during or after pregnancy, or hair that feels dry or thin. Your hair will most likely return to normal after your baby is born.  What to expect at prenatal visits During a routine prenatal visit:  You will be weighed to make sure you and the fetus are growing normally.  Your blood pressure will be taken.  Your abdomen will be measured to track your baby's growth.  The fetal heartbeat will be listened to.  Any test results from the previous visit will be discussed.  Your health care provider may ask  you:  How you are feeling.  If you are feeling the baby move.  If you have had any abnormal symptoms, such as leaking fluid, bleeding, severe headaches, or abdominal cramping.  If you are using any tobacco products, including cigarettes, chewing tobacco, and electronic cigarettes.  If you have any questions.  Other tests that may be performed during your second trimester include:  Blood tests that check for: ? Low iron levels (anemia). ? High blood sugar that affects pregnant women (gestational diabetes) between 78 and 28 weeks. ? Rh antibodies. This is to check for a protein on red blood cells (Rh factor).  Urine tests to check for infections, diabetes, or protein in the urine.  An ultrasound to confirm the proper growth and development of the baby.  An amniocentesis to check for possible genetic problems.  Fetal screens for  spina bifida and Down syndrome.  HIV (human immunodeficiency virus) testing. Routine prenatal testing includes screening for HIV, unless you choose not to have this test.  Follow these instructions at home: Medicines  Follow your health care provider's instructions regarding medicine use. Specific medicines may be either safe or unsafe to take during pregnancy.  Take a prenatal vitamin that contains at least 600 micrograms (mcg) of folic acid.  If you develop constipation, try taking a stool softener if your health care provider approves. Eating and drinking  Eat a balanced diet that includes fresh fruits and vegetables, whole grains, good sources of protein such as meat, eggs, or tofu, and low-fat dairy. Your health care provider will help you determine the amount of weight gain that is right for you.  Avoid raw meat and uncooked cheese. These carry germs that can cause birth defects in the baby.  If you have low calcium intake from food, talk to your health care provider about whether you should take a daily calcium supplement.  Limit foods that  are high in fat and processed sugars, such as fried and sweet foods.  To prevent constipation: ? Drink enough fluid to keep your urine clear or pale yellow. ? Eat foods that are high in fiber, such as fresh fruits and vegetables, whole grains, and beans. Activity  Exercise only as directed by your health care provider. Most women can continue their usual exercise routine during pregnancy. Try to exercise for 30 minutes at least 5 days a week. Stop exercising if you experience uterine contractions.  Avoid heavy lifting, wear low heel shoes, and practice good posture.  A sexual relationship may be continued unless your health care provider directs you otherwise. Relieving pain and discomfort  Wear a good support bra to prevent discomfort from breast tenderness.  Take warm sitz baths to soothe any pain or discomfort caused by hemorrhoids. Use hemorrhoid cream if your health care provider approves.  Rest with your legs elevated if you have leg cramps or low back pain.  If you develop varicose veins, wear support hose. Elevate your feet for 15 minutes, 3-4 times a day. Limit salt in your diet. Prenatal Care  Write down your questions. Take them to your prenatal visits.  Keep all your prenatal visits as told by your health care provider. This is important. Safety  Wear your seat belt at all times when driving.  Make a list of emergency phone numbers, including numbers for family, friends, the hospital, and police and fire departments. General instructions  Ask your health care provider for a referral to a local prenatal education class. Begin classes no later than the beginning of month 6 of your pregnancy.  Ask for help if you have counseling or nutritional needs during pregnancy. Your health care provider can offer advice or refer you to specialists for help with various needs.  Do not use hot tubs, steam rooms, or saunas.  Do not douche or use tampons or scented sanitary  pads.  Do not cross your legs for long periods of time.  Avoid cat litter boxes and soil used by cats. These carry germs that can cause birth defects in the baby and possibly loss of the fetus by miscarriage or stillbirth.  Avoid all smoking, herbs, alcohol, and unprescribed drugs. Chemicals in these products can affect the formation and growth of the baby.  Do not use any products that contain nicotine or tobacco, such as cigarettes and e-cigarettes. If you need help quitting, ask  your health care provider.  Visit your dentist if you have not gone yet during your pregnancy. Use a soft toothbrush to brush your teeth and be gentle when you floss. Contact a health care provider if:  You have dizziness.  You have mild pelvic cramps, pelvic pressure, or nagging pain in the abdominal area.  You have persistent nausea, vomiting, or diarrhea.  You have a bad smelling vaginal discharge.  You have pain when you urinate. Get help right away if:  You have a fever.  You are leaking fluid from your vagina.  You have spotting or bleeding from your vagina.  You have severe abdominal cramping or pain.  You have rapid weight gain or weight loss.  You have shortness of breath with chest pain.  You notice sudden or extreme swelling of your face, hands, ankles, feet, or legs.  You have not felt your baby move in over an hour.  You have severe headaches that do not go away when you take medicine.  You have vision changes. Summary  The second trimester is from week 14 through week 27 (months 4 through 6). It is also a time when the fetus is growing rapidly.  Your body goes through many changes during pregnancy. The changes vary from woman to woman.  Avoid all smoking, herbs, alcohol, and unprescribed drugs. These chemicals affect the formation and growth your baby.  Do not use any tobacco products, such as cigarettes, chewing tobacco, and e-cigarettes. If you need help quitting, ask your  health care provider.  Contact your health care provider if you have any questions. Keep all prenatal visits as told by your health care provider. This is important. This information is not intended to replace advice given to you by your health care provider. Make sure you discuss any questions you have with your health care provider. Document Released: 05/27/2001 Document Revised: 11/08/2015 Document Reviewed: 08/03/2012 Elsevier Interactive Patient Education  2017 [employer].

## 2020-02-22 NOTE — Telephone Encounter (Signed)
Patient spouse has questions regarding insulin use.

## 2020-02-22 NOTE — Telephone Encounter (Signed)
LMOVM returning call 

## 2020-02-29 ENCOUNTER — Ambulatory Visit: Payer: Self-pay | Admitting: Skilled Nursing Facility1

## 2020-03-05 ENCOUNTER — Ambulatory Visit (INDEPENDENT_AMBULATORY_CARE_PROVIDER_SITE_OTHER): Payer: Medicaid - Out of State

## 2020-03-05 ENCOUNTER — Encounter: Payer: Self-pay | Admitting: Women's Health

## 2020-03-05 ENCOUNTER — Ambulatory Visit (INDEPENDENT_AMBULATORY_CARE_PROVIDER_SITE_OTHER): Payer: Medicaid - Out of State | Admitting: Women's Health

## 2020-03-05 VITALS — BP 119/69 | HR 85 | Wt 232.0 lb

## 2020-03-05 DIAGNOSIS — O24112 Pre-existing diabetes mellitus, type 2, in pregnancy, second trimester: Secondary | ICD-10-CM

## 2020-03-05 DIAGNOSIS — O34219 Maternal care for unspecified type scar from previous cesarean delivery: Secondary | ICD-10-CM | POA: Diagnosis not present

## 2020-03-05 DIAGNOSIS — Z98891 History of uterine scar from previous surgery: Secondary | ICD-10-CM

## 2020-03-05 DIAGNOSIS — Z331 Pregnant state, incidental: Secondary | ICD-10-CM

## 2020-03-05 DIAGNOSIS — E119 Type 2 diabetes mellitus without complications: Secondary | ICD-10-CM | POA: Diagnosis not present

## 2020-03-05 DIAGNOSIS — O0992 Supervision of high risk pregnancy, unspecified, second trimester: Secondary | ICD-10-CM | POA: Diagnosis not present

## 2020-03-05 DIAGNOSIS — Z3A18 18 weeks gestation of pregnancy: Secondary | ICD-10-CM | POA: Diagnosis not present

## 2020-03-05 DIAGNOSIS — Z1389 Encounter for screening for other disorder: Secondary | ICD-10-CM

## 2020-03-05 LAB — POCT URINALYSIS DIPSTICK OB
Blood, UA: NEGATIVE
Ketones, UA: NEGATIVE
Leukocytes, UA: NEGATIVE
Nitrite, UA: NEGATIVE
POC,PROTEIN,UA: NEGATIVE

## 2020-03-05 MED ORDER — GLYBURIDE 5 MG PO TABS: ORAL_TABLET | ORAL | 3 refills | Status: DC

## 2020-03-05 NOTE — Progress Notes (Signed)
HIGH-RISK PREGNANCY VISIT Patient name: April [name] MRN [id number]  Date of birth: Dec 29, 1985 Chief Complaint:   Routine Prenatal Visit (ultrasound)  History of Present Illness:   April [name] is a 34 y.o. G65P1001 female at [redacted]w[redacted]d with an Estimated Date of Delivery: 07/31/20 being seen today for ongoing management of a high-risk pregnancy complicated by diabetes mellitus T2DM currently on glyburide 7.5mg  BID.  Today she reports FBS all <95, 2hr pp- reports some 120-130s over the past week- has been stressed d/t death in family, but majority are <120. Rt bottom of foot hurts x few days, doesn't recall injuring it.  Depression screen [facility] 2/9 02/17/2020 01/09/2020  Decreased Interest 1 2  Down, Depressed, Hopeless 0 1  PHQ - 2 Score 1 3  Altered sleeping 2 3  Tired, decreased energy 1 3  Change in appetite 0 0  Feeling bad or failure about yourself  0 1  Trouble concentrating 0 1  Moving slowly or fidgety/restless 0 0  Suicidal thoughts 0 0  PHQ-9 Score 4 11    Contractions: Not present. Vag. Bleeding: None.  Movement: Present. denies leaking of fluid.  Review of Systems:   Pertinent items are noted in HPI Denies abnormal vaginal discharge w/ itching/odor/irritation, headaches, visual changes, shortness of breath, chest pain, abdominal pain, severe nausea/vomiting, or problems with urination or bowel movements unless otherwise stated above. Pertinent History Reviewed:  Reviewed past medical,surgical, social, obstetrical and family history.  Reviewed problem list, medications and allergies. Physical Assessment:   Vitals:   03/05/20 1548  BP: 119/69  Pulse: 85  Weight: 232 lb (105.2 kg)  Body mass index is 42.43 kg/m.           Physical Examination:   General appearance: alert, well appearing, and in no distress  Mental status: alert, oriented to person, place, and time  Skin: warm & dry   Extremities: Edema: None    Cardiovascular: normal heart rate noted  Respiratory:  normal respiratory effort, no distress  Abdomen: gravid, soft, non-tender  Pelvic: Cervical exam deferred         Fetal Status: Fetal Heart Rate (bpm): 148 u/s   Movement: Present    Fetal Surveillance Testing today: [place] 18+6 wks,cephalic,posterior placenta gr 0,normal ovaries,cx 3 cm,fhr 148 bpm,svp of fluid 4.1 cm,EFW 274 g 60%,anatomy complete no obvious abnormalities,limited view because of pt body habitus   Chaperone: n/a    Results for orders placed or performed in visit on 03/05/20 (from the past 24 hour(s))  POC Urinalysis Dipstick OB   Collection Time: 03/05/20  3:53 PM  Result Value Ref Range   Color, UA     Clarity, UA     Glucose, UA Small (1+) (A) Negative   Bilirubin, UA     Ketones, UA neg    Spec Grav, UA     Blood, UA neg    pH, UA     POC,PROTEIN,UA Negative Negative, Trace, Small (1+), Moderate (2+), Large (3+), 4+   Urobilinogen, UA     Nitrite, UA neg    Leukocytes, UA Negative Negative   Appearance     Odor      Assessment & Plan:  1) High-risk pregnancy G2P1001 at [redacted]w[redacted]d with an Estimated Date of Delivery: 07/31/20   2) T2DM, stable on glyburide 7.5mg  BID, refilled today, fetal echo order faxed today  3) Prev c/s  4) Due for pap>wants to wait til next visit  Meds:  Meds ordered this encounter  Medications  .  glyBURIDE (DIABETA) 5 MG tablet    Sig: Take 7.5mg  (1.5 tablets) by mouth in the morning and at night    Dispense:  90 tablet    Refill:  3    Order Specific Question:   Supervising Provider    Answer:   Lazaro Arms [[id number]]    Labs/procedures today: anatomy u/s  Treatment Plan:  Growth u/s @ 20, 24, 28, 32, 36wks      Fetal echo 24-28wks:_____    2x/wk testing @ 32wks or weekly BPP      Deliver @ 39wks:_____   Reviewed: Preterm labor symptoms and general obstetric precautions including but not limited to vaginal bleeding, contractions, leaking of fluid and fetal movement were reviewed in detail with the patient.  All questions were  answered.   Follow-up: Return in about 4 weeks (around 04/02/2020) for HROB w/ pap, US:EFW, in person, MD or CNM.  Orders Placed This Encounter  Procedures  . US OB Follow Up  . POC Urinalysis Dipstick OB   Cheral Marker CNM, [facility] 03/05/2020 4:32 PM

## 2020-03-05 NOTE — Patient Instructions (Signed)
April [name], I greatly value your feedback.  If you receive a survey following your visit with [place] today, we appreciate you taking the time to fill it out.  Thanks, [name], CNM, WHNP-BC  [facility] ([address] [place], [place] [address]) Entrance C, located off of [address] valet parking  Go to [email or web address] to register for FREE online childbirth classes  [place] Pediatricians/Family Doctors:  [facility] [phone]            [facility] [phone]                 [facility] [phone] (usually not accepting new patients unless you have family there already, you are always welcome to call and ask)       [facility] Department [phone]       [facility] Pediatricians/Family Doctors:   [facility]: [phone]  [facility]: [phone]  [facility]: [phone]  [facility] Doctors:   [facility]: [phone]   [facility]: [phone]  [facility] Doctors:  [facility]: [phone]    Home Blood Pressure Monitoring for Patients   Your provider has recommended that you check your blood pressure (BP) at least once a week at home. If you do not have a blood pressure cuff at home, one will be provided for you. Contact your provider if you have not received your monitor within 1 week.   Helpful Tips for Accurate Home Blood Pressure Checks  . Don't smoke, exercise, or drink caffeine 30 minutes before checking your BP . Use the restroom before checking your BP (a full bladder can raise your pressure) . Relax in a comfortable upright chair . Feet on the ground . Left arm resting comfortably on a flat surface at the level of your heart . Legs uncrossed . Back supported . Sit quietly and don't talk . Place the cuff on your bare arm . Adjust snuggly, so  that only two fingertips can fit between your skin and the top of the cuff . Check 2 readings separated by at least one minute . Keep a log of your BP readings . For a visual, please reference this diagram: http://ccnc.care/bpdiagram  Provider Name: [facility]     Phone: [phone]  Zone 1: ALL CLEAR  Continue to monitor your symptoms:  . BP reading is less than 140 (top number) or less than 90 (bottom number)  . No right upper stomach pain . No headaches or seeing spots . No feeling nauseated or throwing up . No swelling in face and hands  Zone 2: CAUTION Call your doctor's office for any of the following:  . BP reading is greater than 140 (top number) or greater than 90 (bottom number)  . Stomach pain under your ribs in the middle or right side . Headaches or seeing spots . Feeling nauseated or throwing up . Swelling in face and hands  Zone 3: EMERGENCY  Seek immediate medical care if you have any of the following:  . BP reading is greater than160 (top number) or greater than 110 (bottom number) . Severe headaches not improving with Tylenol . Serious difficulty catching your breath . Any worsening symptoms from Zone 2     Second Trimester of Pregnancy The second trimester is from week 14 through week 27 (months 4 through 6). The second trimester is often a time when you feel your best.  Your body has adjusted to being pregnant, and you begin to feel better physically. Usually, morning sickness has lessened or quit completely, you may have more energy, and you may have an increase in appetite. The second trimester is also a time when the fetus is growing rapidly. At the end of the sixth month, the fetus is about 9 inches long and weighs about 1 pounds. You will likely begin to feel the baby move (quickening) between 16 and 20 weeks of pregnancy. Body changes during your second trimester Your body continues to go through many changes during your second trimester. The  changes vary from woman to woman.  Your weight will continue to increase. You will notice your lower abdomen bulging out.  You may begin to get stretch marks on your hips, abdomen, and breasts.  You may develop headaches that can be relieved by medicines. The medicines should be approved by your health care provider.  You may urinate more often because the fetus is pressing on your bladder.  You may develop or continue to have heartburn as a result of your pregnancy.  You may develop constipation because certain hormones are causing the muscles that push waste through your intestines to slow down.  You may develop hemorrhoids or swollen, bulging veins (varicose veins).  You may have back pain. This is caused by: ? Weight gain. ? Pregnancy hormones that are relaxing the joints in your pelvis. ? A shift in weight and the muscles that support your balance.  Your breasts will continue to grow and they will continue to become tender.  Your gums may bleed and may be sensitive to brushing and flossing.  Dark spots or blotches (chloasma, mask of pregnancy) may develop on your face. This will likely fade after the baby is born.  A dark line from your belly button to the pubic area (linea nigra) may appear. This will likely fade after the baby is born.  You may have changes in your hair. These can include thickening of your hair, rapid growth, and changes in texture. Some women also have hair loss during or after pregnancy, or hair that feels dry or thin. Your hair will most likely return to normal after your baby is born.  What to expect at prenatal visits During a routine prenatal visit:  You will be weighed to make sure you and the fetus are growing normally.  Your blood pressure will be taken.  Your abdomen will be measured to track your baby's growth.  The fetal heartbeat will be listened to.  Any test results from the previous visit will be discussed.  Your health care  provider may ask you:  How you are feeling.  If you are feeling the baby move.  If you have had any abnormal symptoms, such as leaking fluid, bleeding, severe headaches, or abdominal cramping.  If you are using any tobacco products, including cigarettes, chewing tobacco, and electronic cigarettes.  If you have any questions.  Other tests that may be performed during your second trimester include:  Blood tests that check for: ? Low iron levels (anemia). ? High blood sugar that affects pregnant women (gestational diabetes) between 64 and 28 weeks. ? Rh antibodies. This is to check for a protein on red blood cells (Rh factor).  Urine tests to check for infections, diabetes, or protein in the urine.  An ultrasound to confirm the proper growth and development of the baby.  An amniocentesis to check for possible genetic problems.  Fetal screens  for spina bifida and Down syndrome.  HIV (human immunodeficiency virus) testing. Routine prenatal testing includes screening for HIV, unless you choose not to have this test.  Follow these instructions at home: Medicines  Follow your health care provider's instructions regarding medicine use. Specific medicines may be either safe or unsafe to take during pregnancy.  Take a prenatal vitamin that contains at least 600 micrograms (mcg) of folic acid.  If you develop constipation, try taking a stool softener if your health care provider approves. Eating and drinking  Eat a balanced diet that includes fresh fruits and vegetables, whole grains, good sources of protein such as meat, eggs, or tofu, and low-fat dairy. Your health care provider will help you determine the amount of weight gain that is right for you.  Avoid raw meat and uncooked cheese. These carry germs that can cause birth defects in the baby.  If you have low calcium intake from food, talk to your health care provider about whether you should take a daily calcium  supplement.  Limit foods that are high in fat and processed sugars, such as fried and sweet foods.  To prevent constipation: ? Drink enough fluid to keep your urine clear or pale yellow. ? Eat foods that are high in fiber, such as fresh fruits and vegetables, whole grains, and beans. Activity  Exercise only as directed by your health care provider. Most women can continue their usual exercise routine during pregnancy. Try to exercise for 30 minutes at least 5 days a week. Stop exercising if you experience uterine contractions.  Avoid heavy lifting, wear low heel shoes, and practice good posture.  A sexual relationship may be continued unless your health care provider directs you otherwise. Relieving pain and discomfort  Wear a good support bra to prevent discomfort from breast tenderness.  Take warm sitz baths to soothe any pain or discomfort caused by hemorrhoids. Use hemorrhoid cream if your health care provider approves.  Rest with your legs elevated if you have leg cramps or low back pain.  If you develop varicose veins, wear support hose. Elevate your feet for 15 minutes, 3-4 times a day. Limit salt in your diet. Prenatal Care  Write down your questions. Take them to your prenatal visits.  Keep all your prenatal visits as told by your health care provider. This is important. Safety  Wear your seat belt at all times when driving.  Make a list of emergency phone numbers, including numbers for family, friends, the hospital, and police and fire departments. General instructions  Ask your health care provider for a referral to a local prenatal education class. Begin classes no later than the beginning of month 6 of your pregnancy.  Ask for help if you have counseling or nutritional needs during pregnancy. Your health care provider can offer advice or refer you to specialists for help with various needs.  Do not use hot tubs, steam rooms, or saunas.  Do not douche or use  tampons or scented sanitary pads.  Do not cross your legs for long periods of time.  Avoid cat litter boxes and soil used by cats. These carry germs that can cause birth defects in the baby and possibly loss of the fetus by miscarriage or stillbirth.  Avoid all smoking, herbs, alcohol, and unprescribed drugs. Chemicals in these products can affect the formation and growth of the baby.  Do not use any products that contain nicotine or tobacco, such as cigarettes and e-cigarettes. If you need help quitting,  ask your health care provider.  Visit your dentist if you have not gone yet during your pregnancy. Use a soft toothbrush to brush your teeth and be gentle when you floss. Contact a health care provider if:  You have dizziness.  You have mild pelvic cramps, pelvic pressure, or nagging pain in the abdominal area.  You have persistent nausea, vomiting, or diarrhea.  You have a bad smelling vaginal discharge.  You have pain when you urinate. Get help right away if:  You have a fever.  You are leaking fluid from your vagina.  You have spotting or bleeding from your vagina.  You have severe abdominal cramping or pain.  You have rapid weight gain or weight loss.  You have shortness of breath with chest pain.  You notice sudden or extreme swelling of your face, hands, ankles, feet, or legs.  You have not felt your baby move in over an hour.  You have severe headaches that do not go away when you take medicine.  You have vision changes. Summary  The second trimester is from week 14 through week 27 (months 4 through 6). It is also a time when the fetus is growing rapidly.  Your body goes through many changes during pregnancy. The changes vary from woman to woman.  Avoid all smoking, herbs, alcohol, and unprescribed drugs. These chemicals affect the formation and growth your baby.  Do not use any tobacco products, such as cigarettes, chewing tobacco, and e-cigarettes. If you  need help quitting, ask your health care provider.  Contact your health care provider if you have any questions. Keep all prenatal visits as told by your health care provider. This is important. This information is not intended to replace advice given to you by your health care provider. Make sure you discuss any questions you have with your health care provider. Document Released: 05/27/2001 Document Revised: 11/08/2015 Document Reviewed: 08/03/2012 Elsevier Interactive Patient Education  2017 [employer].

## 2020-03-05 NOTE — Progress Notes (Signed)
[place] 18+6 wks,cephalic,posterior placenta gr 0,normal ovaries,cx 3 cm,fhr 148 bpm,svp of fluid 4.1 cm,EFW 274 g 60%,anatomy complete no obvious abnormalities,limited view because of pt body habitus

## 2020-03-20 ENCOUNTER — Ambulatory Visit: Payer: Medicaid - Out of State | Admitting: Skilled Nursing Facility1

## 2020-04-02 ENCOUNTER — Telehealth: Payer: Self-pay | Admitting: Women's Health

## 2020-04-02 NOTE — Telephone Encounter (Signed)
Pt's husband called to say that we referred patient for a fetal ECHO & that [facility] rejected them due to their [place]  Please advise

## 2020-04-02 NOTE — Telephone Encounter (Signed)
Pt's husband calling back due to needing referral to a different clinic for the fetal ECHO, please see below & please advise    Colen Darling, LPN to Moss Mc, RN     04/02/20 11:50 AM April [name], can you advise on this? Thanks!! JSY    BT  04/02/20 10:41 AM You routed this conversation to Colen Darling, LPN  Me   BT  03/70/48 10:41 AM Note Pt's husband called to say that we referred patient for a fetal ECHO & that [facility] rejected them due to their [place]  Please advise

## 2020-04-04 ENCOUNTER — Ambulatory Visit (INDEPENDENT_AMBULATORY_CARE_PROVIDER_SITE_OTHER): Payer: Medicaid - Out of State | Admitting: Advanced Practice Midwife

## 2020-04-04 ENCOUNTER — Ambulatory Visit (INDEPENDENT_AMBULATORY_CARE_PROVIDER_SITE_OTHER): Payer: Medicaid - Out of State

## 2020-04-04 ENCOUNTER — Encounter: Payer: Self-pay | Admitting: Advanced Practice Midwife

## 2020-04-04 VITALS — BP 112/68 | HR 88 | Wt 231.0 lb

## 2020-04-04 DIAGNOSIS — O24112 Pre-existing diabetes mellitus, type 2, in pregnancy, second trimester: Secondary | ICD-10-CM | POA: Diagnosis not present

## 2020-04-04 DIAGNOSIS — Z1389 Encounter for screening for other disorder: Secondary | ICD-10-CM

## 2020-04-04 DIAGNOSIS — Z331 Pregnant state, incidental: Secondary | ICD-10-CM

## 2020-04-04 DIAGNOSIS — O0992 Supervision of high risk pregnancy, unspecified, second trimester: Secondary | ICD-10-CM

## 2020-04-04 DIAGNOSIS — Z3A23 23 weeks gestation of pregnancy: Secondary | ICD-10-CM

## 2020-04-04 DIAGNOSIS — Z23 Encounter for immunization: Secondary | ICD-10-CM | POA: Diagnosis not present

## 2020-04-04 LAB — POCT URINALYSIS DIPSTICK OB
Blood, UA: NEGATIVE
Ketones, UA: NEGATIVE
Leukocytes, UA: NEGATIVE
Nitrite, UA: NEGATIVE
POC,PROTEIN,UA: NEGATIVE

## 2020-04-04 MED ORDER — GLYBURIDE 5 MG PO TABS: ORAL_TABLET | ORAL | 3 refills | Status: DC

## 2020-04-04 NOTE — Progress Notes (Signed)
HIGH-RISK PREGNANCY VISIT Patient name: April [name] MRN [id number]  Date of birth: 24-Jun-1985 Chief Complaint:   Routine Prenatal Visit  History of Present Illness:   April [name] is a 34 y.o. G16P1001 female at [redacted]w[redacted]d with an Estimated Date of Delivery: 07/31/20 being seen today for ongoing management of a high-risk pregnancy complicated by diabetes mellitus T2DM currently on glyburide 7.5mg  bid.  Today she reports cheeks flushed in recent days; this past week has been doing better with eating regularly and nausea has also improved.  Depression screen [facility] 2/9 02/17/2020 01/09/2020  Decreased Interest 1 2  Down, Depressed, Hopeless 0 1  PHQ - 2 Score 1 3  Altered sleeping 2 3  Tired, decreased energy 1 3  Change in appetite 0 0  Feeling bad or failure about yourself  0 1  Trouble concentrating 0 1  Moving slowly or fidgety/restless 0 0  Suicidal thoughts 0 0  PHQ-9 Score 4 11     .  .   . denies leaking of fluid.  Review of Systems:   Pertinent items are noted in HPI Denies abnormal vaginal discharge w/ itching/odor/irritation, headaches, visual changes, shortness of breath, chest pain, abdominal pain, severe nausea/vomiting, or problems with urination or bowel movements unless otherwise stated above. Pertinent History Reviewed:  Reviewed past medical,surgical, social, obstetrical and family history.  Reviewed problem list, medications and allergies. Physical Assessment:   Vitals:   04/04/20 1413  BP: 112/68  Pulse: 88  Weight: 231 lb (104.8 kg)  Body mass index is 42.25 kg/m.           Physical Examination:   General appearance: alert, well appearing, and in no distress; cheeks flushed  Mental status: alert, oriented to person, place, and time  Skin: warm & dry   Extremities: Edema: None    Cardiovascular: normal heart rate noted  Respiratory: normal respiratory effort, no distress  Abdomen: gravid, soft, non-tender  Pelvic: Cervical exam deferred         Fetal  Status: Fetal Heart Rate (bpm): 146 u/s        Fetal Surveillance Testing today: [place] 23+1 wks,breech,posterior placenta gr 0,normal ovaries,cx 3 cm,svp of fluid 6.5 cm,fhr 146 bpm,EFW 603 g 60%    Results for orders placed or performed in visit on 04/04/20 (from the past 24 hour(s))  POC Urinalysis Dipstick OB   Collection Time: 04/04/20  2:17 PM  Result Value Ref Range   Color, UA     Clarity, UA     Glucose, UA Small (1+) (A) Negative   Bilirubin, UA     Ketones, UA neg    Spec Grav, UA     Blood, UA neg    pH, UA     POC,PROTEIN,UA Negative Negative, Trace, Small (1+), Moderate (2+), Large (3+), 4+   Urobilinogen, UA     Nitrite, UA neg    Leukocytes, UA Negative Negative   Appearance     Odor      Assessment & Plan:  1) High-risk pregnancy G2P1001 at [redacted]w[redacted]d with an Estimated Date of Delivery: 07/31/20   2) T2DM, all fastings in 80s; all 2hr PPs <120 (except occ at dinner may be in 120s); continue on glyburide 7.5mg  bid; getting fetal echo referral in [place]; growth scan at next visit  3) Prev c/s, wants to decide after 36wk EFW scan  Meds:  Meds ordered this encounter  Medications  . glyBURIDE (DIABETA) 5 MG tablet    Sig: Take 7.5mg  (1.5  tablets) by mouth in the morning and at night    Dispense:  90 tablet    Refill:  3    Order Specific Question:   Supervising Provider    Answer:   Lazaro Arms [[id number]]    Labs/procedures today: growth scan; flu vax  Treatment Plan: Getting fetal echo referral in [place] (she has [facility]); growth scan with next visit; ante testing starting at 32wks  Reviewed: Preterm labor symptoms and general obstetric precautions including but not limited to vaginal bleeding, contractions, leaking of fluid and fetal movement were reviewed in detail with the patient.  All questions were answered. Has home bp cuff. Check bp weekly, let us know if >140/90.   Follow-up: Return in about 4 weeks (around 05/02/2020) for HROB, [place]: OB F/U growth, in  person.  Orders Placed This Encounter  Procedures  . POC Urinalysis Dipstick OB   Arabella Merles [facility] 04/04/2020 2:44 PM

## 2020-04-04 NOTE — Patient Instructions (Signed)
April [name], I greatly value your feedback.  If you receive a survey following your visit with us today, we appreciate you taking the time to fill it out.  Thanks, Kim Atthew Coutant, CNM  [facility] ([address] [place], [place] [address]) Entrance C, located off of [address] Free 24/7 valet parking  Go to [email or web address] to register for FREE online childbirth classes  [place] Pediatricians/Family Doctors:  [facility] [phone]            [facility] [phone]                 [facility] [phone] (usually not accepting new patients unless you have family there already, you are always welcome to call and ask)       [facility] [phone]       [place] Pediatricians/Family Doctors:   [facility]: [phone]  [facility]: [phone]  [facility]: [phone]  [place] Family Doctors:   [facility]: [phone]   [facility]: [phone]  [place] Family Doctors:  [facility]: [phone]    Home Blood Pressure Monitoring for Patients   Your provider has recommended that you check your blood pressure (BP) at least once a week at home. If you do not have a blood pressure cuff at home, one will be provided for you. Contact your provider if you have not received your monitor within 1 week.   Helpful Tips for Accurate Home Blood Pressure Checks  . Don't smoke, exercise, or drink caffeine 30 minutes before checking your BP . Use the restroom before checking your BP (a full bladder can raise your pressure) . Relax in a comfortable upright chair . Feet on the ground . Left arm resting comfortably on a flat surface at the level of your heart . Legs uncrossed . Back supported . Sit quietly and don't talk . Place the cuff on your bare arm . Adjust snuggly, so that only  two fingertips can fit between your skin and the top of the cuff . Check 2 readings separated by at least one minute . Keep a log of your BP readings . For a visual, please reference this diagram: http://ccnc.care/bpdiagram  Provider Name: [facility]     Phone: [phone]  Zone 1: ALL CLEAR  Continue to monitor your symptoms:  . BP reading is less than 140 (top number) or less than 90 (bottom number)  . No right upper stomach pain . No headaches or seeing spots . No feeling nauseated or throwing up . No swelling in face and hands  Zone 2: CAUTION Call your doctor's office for any of the following:  . BP reading is greater than 140 (top number) or greater than 90 (bottom number)  . Stomach pain under your ribs in the middle or right side . Headaches or seeing spots . Feeling nauseated or throwing up . Swelling in face and hands  Zone 3: EMERGENCY  Seek immediate medical care if you have any of the following:  . BP reading is greater than160 (top number) or greater than 110 (bottom number) . Severe headaches not improving with Tylenol . Serious difficulty catching your breath . Any worsening symptoms from Zone 2     Second Trimester of Pregnancy The second trimester is from week 14 through week 27 (months 4 through 6). The second trimester is often a time when you feel your best. Your   body has adjusted to being pregnant, and you begin to feel better physically. Usually, morning sickness has lessened or quit completely, you may have more energy, and you may have an increase in appetite. The second trimester is also a time when the fetus is growing rapidly. At the end of the sixth month, the fetus is about 9 inches long and weighs about 1 pounds. You will likely begin to feel the baby move (quickening) between 16 and 20 weeks of pregnancy. Body changes during your second trimester Your body continues to go through many changes during your second trimester. The changes vary  from woman to woman.  Your weight will continue to increase. You will notice your lower abdomen bulging out.  You may begin to get stretch marks on your hips, abdomen, and breasts.  You may develop headaches that can be relieved by medicines. The medicines should be approved by your health care provider.  You may urinate more often because the fetus is pressing on your bladder.  You may develop or continue to have heartburn as a result of your pregnancy.  You may develop constipation because certain hormones are causing the muscles that push waste through your intestines to slow down.  You may develop hemorrhoids or swollen, bulging veins (varicose veins).  You may have back pain. This is caused by: ? Weight gain. ? Pregnancy hormones that are relaxing the joints in your pelvis. ? A shift in weight and the muscles that support your balance.  Your breasts will continue to grow and they will continue to become tender.  Your gums may bleed and may be sensitive to brushing and flossing.  Dark spots or blotches (chloasma, mask of pregnancy) may develop on your face. This will likely fade after the baby is born.  A dark line from your belly button to the pubic area (linea nigra) may appear. This will likely fade after the baby is born.  You may have changes in your hair. These can include thickening of your hair, rapid growth, and changes in texture. Some women also have hair loss during or after pregnancy, or hair that feels dry or thin. Your hair will most likely return to normal after your baby is born.  What to expect at prenatal visits During a routine prenatal visit:  You will be weighed to make sure you and the fetus are growing normally.  Your blood pressure will be taken.  Your abdomen will be measured to track your baby's growth.  The fetal heartbeat will be listened to.  Any test results from the previous visit will be discussed.  Your health care provider may ask  you:  How you are feeling.  If you are feeling the baby move.  If you have had any abnormal symptoms, such as leaking fluid, bleeding, severe headaches, or abdominal cramping.  If you are using any tobacco products, including cigarettes, chewing tobacco, and electronic cigarettes.  If you have any questions.  Other tests that may be performed during your second trimester include:  Blood tests that check for: ? Low iron levels (anemia). ? High blood sugar that affects pregnant women (gestational diabetes) between 78 and 28 weeks. ? Rh antibodies. This is to check for a protein on red blood cells (Rh factor).  Urine tests to check for infections, diabetes, or protein in the urine.  An ultrasound to confirm the proper growth and development of the baby.  An amniocentesis to check for possible genetic problems.  Fetal screens for  spina bifida and Down syndrome.  HIV (human immunodeficiency virus) testing. Routine prenatal testing includes screening for HIV, unless you choose not to have this test.  Follow these instructions at home: Medicines  Follow your health care provider's instructions regarding medicine use. Specific medicines may be either safe or unsafe to take during pregnancy.  Take a prenatal vitamin that contains at least 600 micrograms (mcg) of folic acid.  If you develop constipation, try taking a stool softener if your health care provider approves. Eating and drinking  Eat a balanced diet that includes fresh fruits and vegetables, whole grains, good sources of protein such as meat, eggs, or tofu, and low-fat dairy. Your health care provider will help you determine the amount of weight gain that is right for you.  Avoid raw meat and uncooked cheese. These carry germs that can cause birth defects in the baby.  If you have low calcium intake from food, talk to your health care provider about whether you should take a daily calcium supplement.  Limit foods that  are high in fat and processed sugars, such as fried and sweet foods.  To prevent constipation: ? Drink enough fluid to keep your urine clear or pale yellow. ? Eat foods that are high in fiber, such as fresh fruits and vegetables, whole grains, and beans. Activity  Exercise only as directed by your health care provider. Most women can continue their usual exercise routine during pregnancy. Try to exercise for 30 minutes at least 5 days a week. Stop exercising if you experience uterine contractions.  Avoid heavy lifting, wear low heel shoes, and practice good posture.  A sexual relationship may be continued unless your health care provider directs you otherwise. Relieving pain and discomfort  Wear a good support bra to prevent discomfort from breast tenderness.  Take warm sitz baths to soothe any pain or discomfort caused by hemorrhoids. Use hemorrhoid cream if your health care provider approves.  Rest with your legs elevated if you have leg cramps or low back pain.  If you develop varicose veins, wear support hose. Elevate your feet for 15 minutes, 3-4 times a day. Limit salt in your diet. Prenatal Care  Write down your questions. Take them to your prenatal visits.  Keep all your prenatal visits as told by your health care provider. This is important. Safety  Wear your seat belt at all times when driving.  Make a list of emergency phone numbers, including numbers for family, friends, the hospital, and police and fire departments. General instructions  Ask your health care provider for a referral to a local prenatal education class. Begin classes no later than the beginning of month 6 of your pregnancy.  Ask for help if you have counseling or nutritional needs during pregnancy. Your health care provider can offer advice or refer you to specialists for help with various needs.  Do not use hot tubs, steam rooms, or saunas.  Do not douche or use tampons or scented sanitary  pads.  Do not cross your legs for long periods of time.  Avoid cat litter boxes and soil used by cats. These carry germs that can cause birth defects in the baby and possibly loss of the fetus by miscarriage or stillbirth.  Avoid all smoking, herbs, alcohol, and unprescribed drugs. Chemicals in these products can affect the formation and growth of the baby.  Do not use any products that contain nicotine or tobacco, such as cigarettes and e-cigarettes. If you need help quitting, ask   your health care provider.  Visit your dentist if you have not gone yet during your pregnancy. Use a soft toothbrush to brush your teeth and be gentle when you floss. Contact a health care provider if:  You have dizziness.  You have mild pelvic cramps, pelvic pressure, or nagging pain in the abdominal area.  You have persistent nausea, vomiting, or diarrhea.  You have a bad smelling vaginal discharge.  You have pain when you urinate. Get help right away if:  You have a fever.  You are leaking fluid from your vagina.  You have spotting or bleeding from your vagina.  You have severe abdominal cramping or pain.  You have rapid weight gain or weight loss.  You have shortness of breath with chest pain.  You notice sudden or extreme swelling of your face, hands, ankles, feet, or legs.  You have not felt your baby move in over an hour.  You have severe headaches that do not go away when you take medicine.  You have vision changes. Summary  The second trimester is from week 14 through week 27 (months 4 through 6). It is also a time when the fetus is growing rapidly.  Your body goes through many changes during pregnancy. The changes vary from woman to woman.  Avoid all smoking, herbs, alcohol, and unprescribed drugs. These chemicals affect the formation and growth your baby.  Do not use any tobacco products, such as cigarettes, chewing tobacco, and e-cigarettes. If you need help quitting, ask your health  care provider.  Contact your health care provider if you have any questions. Keep all prenatal visits as told by your health care provider. This is important. This information is not intended to replace advice given to you by your health care provider. Make sure you discuss any questions you have with your health care provider. Document Released: 05/27/2001 Document Revised: 11/08/2015 Document Reviewed: 08/03/2012 Elsevier Interactive Patient Education  2017 [employer].

## 2020-04-04 NOTE — Progress Notes (Signed)
[place] 23+1 wks,breech,posterior placenta gr 0,normal ovaries,cx 3 cm,svp of fluid 6.5 cm,fhr 146 bpm,EFW 603 g 60%

## 2020-05-01 ENCOUNTER — Other Ambulatory Visit: Payer: Self-pay | Admitting: Advanced Practice Midwife

## 2020-05-01 DIAGNOSIS — O24112 Pre-existing diabetes mellitus, type 2, in pregnancy, second trimester: Secondary | ICD-10-CM

## 2020-05-02 ENCOUNTER — Other Ambulatory Visit: Payer: Self-pay

## 2020-05-02 ENCOUNTER — Ambulatory Visit (INDEPENDENT_AMBULATORY_CARE_PROVIDER_SITE_OTHER): Payer: Medicaid - Out of State

## 2020-05-02 ENCOUNTER — Ambulatory Visit (INDEPENDENT_AMBULATORY_CARE_PROVIDER_SITE_OTHER): Payer: Medicaid - Out of State | Admitting: Advanced Practice Midwife

## 2020-05-02 VITALS — BP 110/74 | HR 75 | Wt 231.4 lb

## 2020-05-02 DIAGNOSIS — Z3A27 27 weeks gestation of pregnancy: Secondary | ICD-10-CM | POA: Diagnosis not present

## 2020-05-02 DIAGNOSIS — O0992 Supervision of high risk pregnancy, unspecified, second trimester: Secondary | ICD-10-CM

## 2020-05-02 DIAGNOSIS — O24112 Pre-existing diabetes mellitus, type 2, in pregnancy, second trimester: Secondary | ICD-10-CM | POA: Diagnosis not present

## 2020-05-02 DIAGNOSIS — Z331 Pregnant state, incidental: Secondary | ICD-10-CM

## 2020-05-02 DIAGNOSIS — Z98891 History of uterine scar from previous surgery: Secondary | ICD-10-CM

## 2020-05-02 DIAGNOSIS — Z1389 Encounter for screening for other disorder: Secondary | ICD-10-CM

## 2020-05-02 DIAGNOSIS — Z789 Other specified health status: Secondary | ICD-10-CM | POA: Insufficient documentation

## 2020-05-02 LAB — POCT URINALYSIS DIPSTICK OB
Blood, UA: NEGATIVE
Ketones, UA: NEGATIVE
Leukocytes, UA: NEGATIVE
Nitrite, UA: NEGATIVE
POC,PROTEIN,UA: NEGATIVE

## 2020-05-02 MED ORDER — PANTOPRAZOLE SODIUM 20 MG PO TBEC: 20.0000 mg | DELAYED_RELEASE_TABLET | Freq: Every day | ORAL | 6 refills | Status: AC

## 2020-05-02 NOTE — Patient Instructions (Signed)
April [name], I greatly value your feedback.  If you receive a survey following your visit with [place] today, we appreciate you taking the time to fill it out.  Thanks, [name], CNM   [facility] ([address] [place], [place] [address]) Entrance C, located off of [address] valet parking  Go to [email or web address] to register for FREE online childbirth classes   Call the office [phone]) or go to [facility] if:  You begin to have strong, frequent contractions  Your water breaks.  Sometimes it is a big gush of fluid, sometimes it is just a trickle that keeps getting your panties wet or running down your legs  You have vaginal bleeding.  It is normal to have a small amount of spotting if your cervix was checked.   You don't feel your baby moving like normal.  If you don't, get you something to eat and drink and lay down and focus on feeling your baby move.  You should feel at least 10 movements in 2 hours.  If you don't, you should call the office or go to [facility].    Tdap Vaccine  It is recommended that you get the Tdap vaccine during the third trimester of EACH pregnancy to help protect your baby from getting pertussis (whooping cough)  27-36 weeks is the BEST time to do this so that you can pass the protection on to your baby. During pregnancy is better than after pregnancy, but if you are unable to get it during pregnancy it will be offered at the hospital.   You can get this vaccine with [place], at the health department, your family doctor, or some local pharmacies  Everyone who will be around your baby should also be up-to-date on their vaccines before the baby comes. Adults (who are not pregnant) only need 1 dose of Tdap during adulthood.   [place] Pediatricians/Family Doctors:  [facility] [phone]            [facility] [phone]                 [facility] [phone]  (usually not accepting new patients unless you have family there already, you are always welcome to call and ask)       [facility] [phone]       [facility] Pediatricians/Family Doctors:   [facility]: [phone]  [facility]: [phone]  [facility]: [phone]  [facility] Doctors:   [facility]: [phone]   [facility]: [phone]  [facility] Doctors:  [facility]: [phone]   Home Blood Pressure Monitoring for Patients   Your provider has recommended that you check your blood pressure (BP) at least once a week at home. If you do not have a blood pressure cuff at home, one will be provided for you. Contact your provider if you have not received your monitor within 1 week.   Helpful Tips for Accurate Home Blood Pressure Checks  . Don't smoke, exercise, or drink caffeine 30 minutes before checking your BP . Use the restroom before checking your BP (a full bladder can raise your pressure) . Relax in a comfortable upright chair . Feet on the ground . Left arm resting comfortably on a flat surface at the level of your heart . Legs uncrossed . Back supported . Sit quietly and don't talk . Place the cuff on your bare arm .  Adjust snuggly, so that only two fingertips can fit between your skin and the top of the cuff . Check 2 readings separated by at least one minute . Keep a log of your BP readings . For a visual, please reference this diagram: http://ccnc.care/bpdiagram  Provider Name: [facility]     Phone: [phone]  Zone 1: ALL CLEAR  Continue to monitor your symptoms:  . BP reading is less than 140 (top number) or less than 90 (bottom number)  . No right upper stomach pain . No headaches or seeing spots . No feeling nauseated or throwing up . No swelling in face and hands  Zone 2: CAUTION Call your doctor's office for  any of the following:  . BP reading is greater than 140 (top number) or greater than 90 (bottom number)  . Stomach pain under your ribs in the middle or right side . Headaches or seeing spots . Feeling nauseated or throwing up . Swelling in face and hands  Zone 3: EMERGENCY  Seek immediate medical care if you have any of the following:  . BP reading is greater than160 (top number) or greater than 110 (bottom number) . Severe headaches not improving with Tylenol . Serious difficulty catching your breath . Any worsening symptoms from Zone 2   Third Trimester of Pregnancy The third trimester is from week 29 through week 42, months 7 through 9. The third trimester is a time when the fetus is growing rapidly. At the end of the ninth month, the fetus is about 20 inches in length and weighs 6-10 pounds.  BODY CHANGES Your body goes through many changes during pregnancy. The changes vary from woman to woman.   Your weight will continue to increase. You can expect to gain 25-35 pounds (11-16 kg) by the end of the pregnancy.  You may begin to get stretch marks on your hips, abdomen, and breasts.  You may urinate more often because the fetus is moving lower into your pelvis and pressing on your bladder.  You may develop or continue to have heartburn as a result of your pregnancy.  You may develop constipation because certain hormones are causing the muscles that push waste through your intestines to slow down.  You may develop hemorrhoids or swollen, bulging veins (varicose veins).  You may have pelvic pain because of the weight gain and pregnancy hormones relaxing your joints between the bones in your pelvis. Backaches may result from overexertion of the muscles supporting your posture.  You may have changes in your hair. These can include thickening of your hair, rapid growth, and changes in texture. Some women also have hair loss during or after pregnancy, or hair that feels dry or thin.  Your hair will most likely return to normal after your baby is born.  Your breasts will continue to grow and be tender. A yellow discharge may leak from your breasts called colostrum.  Your belly button may stick out.  You may feel short of breath because of your expanding uterus.  You may notice the fetus "dropping," or moving lower in your abdomen.  You may have a bloody mucus discharge. This usually occurs a few days to a week before labor begins.  Your cervix becomes thin and soft (effaced) near your due date. WHAT TO EXPECT AT YOUR PRENATAL EXAMS  You will have prenatal exams every 2 weeks until week 36. Then, you will have weekly prenatal exams. During a routine prenatal visit:  You will be  weighed to make sure you and the fetus are growing normally.  Your blood pressure is taken.  Your abdomen will be measured to track your baby's growth.  The fetal heartbeat will be listened to.  Any test results from the previous visit will be discussed.  You may have a cervical check near your due date to see if you have effaced. At around 36 weeks, your caregiver will check your cervix. At the same time, your caregiver will also perform a test on the secretions of the vaginal tissue. This test is to determine if a type of bacteria, Group B streptococcus, is present. Your caregiver will explain this further. Your caregiver may ask you:  What your birth plan is.  How you are feeling.  If you are feeling the baby move.  If you have had any abnormal symptoms, such as leaking fluid, bleeding, severe headaches, or abdominal cramping.  If you have any questions. Other tests or screenings that may be performed during your third trimester include:  Blood tests that check for low iron levels (anemia).  Fetal testing to check the health, activity level, and growth of the fetus. Testing is done if you have certain medical conditions or if there are problems during the pregnancy. FALSE  LABOR You may feel small, irregular contractions that eventually go away. These are called Braxton Hicks contractions, or false labor. Contractions may last for hours, days, or even weeks before true labor sets in. If contractions come at regular intervals, intensify, or become painful, it is best to be seen by your caregiver.  SIGNS OF LABOR   Menstrual-like cramps.  Contractions that are 5 minutes apart or less.  Contractions that start on the top of the uterus and spread down to the lower abdomen and back.  A sense of increased pelvic pressure or back pain.  A watery or bloody mucus discharge that comes from the vagina. If you have any of these signs before the 37th week of pregnancy, call your caregiver right away. You need to go to the hospital to get checked immediately. HOME CARE INSTRUCTIONS   Avoid all smoking, herbs, alcohol, and unprescribed drugs. These chemicals affect the formation and growth of the baby.  Follow your caregiver's instructions regarding medicine use. There are medicines that are either safe or unsafe to take during pregnancy.  Exercise only as directed by your caregiver. Experiencing uterine cramps is a good sign to stop exercising.  Continue to eat regular, healthy meals.  Wear a good support bra for breast tenderness.  Do not use hot tubs, steam rooms, or saunas.  Wear your seat belt at all times when driving.  Avoid raw meat, uncooked cheese, cat litter boxes, and soil used by cats. These carry germs that can cause birth defects in the baby.  Take your prenatal vitamins.  Try taking a stool softener (if your caregiver approves) if you develop constipation. Eat more high-fiber foods, such as fresh vegetables or fruit and whole grains. Drink plenty of fluids to keep your urine clear or pale yellow.  Take warm sitz baths to soothe any pain or discomfort caused by hemorrhoids. Use hemorrhoid cream if your caregiver approves.  If you develop varicose  veins, wear support hose. Elevate your feet for 15 minutes, 3-4 times a day. Limit salt in your diet.  Avoid heavy lifting, wear low heal shoes, and practice good posture.  Rest a lot with your legs elevated if you have leg cramps or low back pain.  Visit your dentist if you have not gone during your pregnancy. Use a soft toothbrush to brush your teeth and be gentle when you floss.  A sexual relationship may be continued unless your caregiver directs you otherwise.  Do not travel far distances unless it is absolutely necessary and only with the approval of your caregiver.  Take prenatal classes to understand, practice, and ask questions about the labor and delivery.  Make a trial run to the hospital.  Pack your hospital bag.  Prepare the baby's nursery.  Continue to go to all your prenatal visits as directed by your caregiver. SEEK MEDICAL CARE IF:  You are unsure if you are in labor or if your water has broken.  You have dizziness.  You have mild pelvic cramps, pelvic pressure, or nagging pain in your abdominal area.  You have persistent nausea, vomiting, or diarrhea.  You have a bad smelling vaginal discharge.  You have pain with urination. SEEK IMMEDIATE MEDICAL CARE IF:   You have a fever.  You are leaking fluid from your vagina.  You have spotting or bleeding from your vagina.  You have severe abdominal cramping or pain.  You have rapid weight loss or gain.  You have shortness of breath with chest pain.  You notice sudden or extreme swelling of your face, hands, ankles, feet, or legs.  You have not felt your baby move in over an hour.  You have severe headaches that do not go away with medicine.  You have vision changes. Document Released: 05/27/2001 Document Revised: 06/07/2013 Document Reviewed: 08/03/2012 [facility] Patient Information 2015 [place], [place]. This information is not intended to replace advice given to you by your health care provider. Make  sure you discuss any questions you have with your health care provider.

## 2020-05-02 NOTE — Progress Notes (Signed)
[place] 27+1 wks,cephalic,posterior placenta gr 2,AFI 19.3 cm,FHR 148 bpm,EFW 1347 g 97%

## 2020-05-02 NOTE — Progress Notes (Signed)
HIGH-RISK PREGNANCY VISIT Patient name: April [name] MRN [id number]  Date of birth: 11-11-1985 Chief Complaint:   Routine Prenatal Visit  History of Present Illness:   April [name] is a 34 y.o. G81P1001 female at [redacted]w[redacted]d with an Estimated Date of Delivery: 07/31/20 being seen today for ongoing management of a high-risk pregnancy complicated by diabetes mellitus T2DM currently on glyburide 7.5mg  bid.  Today she reports still with decreased appetite; also having reflux; had fetal echo on 11/1 and requested records be sent to [place] but they haven't come in yet; no longer interested in BTL.  Fastings: all in the 80s 2hr PPs: one >120 in the past 4 weeks  Depression screen [facility] 2/9 02/17/2020 01/09/2020  Decreased Interest 1 2  Down, Depressed, Hopeless 0 1  PHQ - 2 Score 1 3  Altered sleeping 2 3  Tired, decreased energy 1 3  Change in appetite 0 0  Feeling bad or failure about yourself  0 1  Trouble concentrating 0 1  Moving slowly or fidgety/restless 0 0  Suicidal thoughts 0 0  PHQ-9 Score 4 11    Contractions: Not present. Vag. Bleeding: None.  Movement: Present. denies leaking of fluid.  Review of Systems:   Pertinent items are noted in HPI Denies abnormal vaginal discharge w/ itching/odor/irritation, headaches, visual changes, shortness of breath, chest pain, abdominal pain, severe nausea/vomiting, or problems with urination or bowel movements unless otherwise stated above. Pertinent History Reviewed:  Reviewed past medical,surgical, social, obstetrical and family history.  Reviewed problem list, medications and allergies. Physical Assessment:   Vitals:   05/02/20 1549  BP: 110/74  Pulse: 75  Weight: 231 lb 6.4 oz (105 kg)  Body mass index is 42.32 kg/m.           Physical Examination:   General appearance: alert, well appearing, and in no distress  Mental status: alert, oriented to person, place, and time  Skin: warm & dry   Extremities: Edema: None    Cardiovascular:  normal heart rate noted  Respiratory: normal respiratory effort, no distress  Abdomen: gravid, soft, non-tender  Pelvic: Cervical exam deferred         Fetal Status: Fetal Heart Rate (bpm): 148 u/s   Movement: Present    Fetal Surveillance Testing today: [place] 27+1 wks,cephalic,posterior placenta gr 2,AFI 19.3 cm,FHR 148 bpm,EFW 1347 g 97%     Results for orders placed or performed in visit on 05/02/20 (from the past 24 hour(s))  POC Urinalysis Dipstick OB   Collection Time: 05/02/20  3:52 PM  Result Value Ref Range   Color, UA     Clarity, UA     Glucose, UA Moderate (2+) (A) Negative   Bilirubin, UA     Ketones, UA neg    Spec Grav, UA     Blood, UA neg    pH, UA     POC,PROTEIN,UA Negative Negative, Trace, Small (1+), Moderate (2+), Large (3+), 4+   Urobilinogen, UA     Nitrite, UA neg    Leukocytes, UA Negative Negative   Appearance     Odor      Assessment & Plan:  High-risk pregnancy: G2P1001 at [redacted]w[redacted]d with an Estimated Date of Delivery: 07/31/20   1) T2DM, stable on glyburide 7.5mg  bid; EFW 97th% with AC at the 98th%; start 2x/wk testing @ 32wks  2) Reflux, rx Protonix qd, may supplement with Tums prn  3) PP contraception, no longer wants permanency of BTL, leaning towards POPs  4) Hx  C/S, will decide on TOLAC v rLTCS after 36wk EFW  Meds:  Meds ordered this encounter  Medications  . pantoprazole (PROTONIX) 20 MG tablet    Sig: Take 1 tablet (20 mg total) by mouth daily.    Dispense:  30 tablet    Refill:  6    Order Specific Question:   Supervising Provider    Answer:   Lazaro Arms [[id number]]    Labs/procedures today: growth u/s; will send ROI for fetal echo report   Treatment Plan:  Start 2x/wk testing at 32wks; growth scans @ 32 and 36wks; visit with LHE to discuss   Reviewed: Preterm labor symptoms and general obstetric precautions including but not limited to vaginal bleeding, contractions, leaking of fluid and fetal movement were reviewed in detail with  the patient.  All questions were answered. Didn't ask about home bp cuff. Check bp weekly, let us know if >140/90.   Follow-up: Return in about 3 weeks (around 05/23/2020) for HROB, in person.   Future Appointments  Date Time Provider Department Center  05/23/2020  3:30 PM Arabella Merles, CNM [facility]    Orders Placed This Encounter  Procedures  . POC Urinalysis Dipstick OB   Arabella Merles [facility] 05/02/2020 4:56 PM

## 2020-05-23 ENCOUNTER — Encounter: Payer: Medicaid - Out of State | Admitting: Advanced Practice Midwife

## 2020-05-30 ENCOUNTER — Ambulatory Visit (INDEPENDENT_AMBULATORY_CARE_PROVIDER_SITE_OTHER): Payer: Medicaid - Out of State | Admitting: Advanced Practice Midwife

## 2020-05-30 ENCOUNTER — Other Ambulatory Visit: Payer: Self-pay

## 2020-05-30 VITALS — BP 103/68 | HR 97 | Wt 235.8 lb

## 2020-05-30 DIAGNOSIS — O0993 Supervision of high risk pregnancy, unspecified, third trimester: Secondary | ICD-10-CM

## 2020-05-30 DIAGNOSIS — O24113 Pre-existing diabetes mellitus, type 2, in pregnancy, third trimester: Secondary | ICD-10-CM

## 2020-05-30 DIAGNOSIS — Z331 Pregnant state, incidental: Secondary | ICD-10-CM

## 2020-05-30 DIAGNOSIS — Z302 Encounter for sterilization: Secondary | ICD-10-CM

## 2020-05-30 DIAGNOSIS — Z3A31 31 weeks gestation of pregnancy: Secondary | ICD-10-CM

## 2020-05-30 DIAGNOSIS — Z1389 Encounter for screening for other disorder: Secondary | ICD-10-CM

## 2020-05-30 LAB — POCT URINALYSIS DIPSTICK OB
Blood, UA: NEGATIVE
Leukocytes, UA: NEGATIVE
Nitrite, UA: NEGATIVE

## 2020-05-30 NOTE — Patient Instructions (Signed)
April [name], I greatly value your feedback.  If you receive a survey following your visit with us today, we appreciate you taking the time to fill it out.  Thanks, April [name], CNM   [facility] ([address] [place], [place] [address]) Entrance C, located off of [address] Free 24/7 valet parking  Go to [email or web address] to register for FREE online childbirth classes   Call the office ([phone]) or go to [facility] if:  You begin to have strong, frequent contractions  Your water breaks.  Sometimes it is a big gush of fluid, sometimes it is just a trickle that keeps getting your panties wet or running down your legs  You have vaginal bleeding.  It is normal to have a small amount of spotting if your cervix was checked.   You don't feel your baby moving like normal.  If you don't, get you something to eat and drink and lay down and focus on feeling your baby move.  You should feel at least 10 movements in 2 hours.  If you don't, you should call the office or go to [facility].    Tdap Vaccine  It is recommended that you get the Tdap vaccine during the third trimester of EACH pregnancy to help protect your baby from getting pertussis (whooping cough)  27-36 weeks is the BEST time to do this so that you can pass the protection on to your baby. During pregnancy is better than after pregnancy, but if you are unable to get it during pregnancy it will be offered at the hospital.   You can get this vaccine with us, at the health department, your family doctor, or some local pharmacies  Everyone who will be around your baby should also be up-to-date on their vaccines before the baby comes. Adults (who are not pregnant) only need 1 dose of Tdap during adulthood.   [place] Pediatricians/Family Doctors:  [facility] [phone]            [facility] [phone]                 [facility] [phone]  (usually not accepting new patients unless you have family there already, you are always welcome to call and ask)       [facility] [phone]       [place] Pediatricians/Family Doctors:   [facility]: [phone]  [facility]: [phone]  [facility]: [phone]  [place] Family Doctors:   [facility]: [phone]   [facility]: [phone]  [place] Family Doctors:  [facility]: [phone]   Home Blood Pressure Monitoring for Patients   Your provider has recommended that you check your blood pressure (BP) at least once a week at home. If you do not have a blood pressure cuff at home, one will be provided for you. Contact your provider if you have not received your monitor within 1 week.   Helpful Tips for Accurate Home Blood Pressure Checks  . Don't smoke, exercise, or drink caffeine 30 minutes before checking your BP . Use the restroom before checking your BP (a full bladder can raise your pressure) . Relax in a comfortable upright chair . Feet on the ground . Left arm resting comfortably on a flat surface at the level of your heart . Legs uncrossed . Back supported . Sit quietly and don't talk . Place the cuff on your bare arm .   Adjust snuggly, so that only two fingertips can fit between your skin and the top of the cuff . Check 2 readings separated by at least one minute . Keep a log of your BP readings . For a visual, please reference this diagram: http://ccnc.care/bpdiagram  Provider Name: [facility]     Phone: [phone]  Zone 1: ALL CLEAR  Continue to monitor your symptoms:  . BP reading is less than 140 (top number) or less than 90 (bottom number)  . No right upper stomach pain . No headaches or seeing spots . No feeling nauseated or throwing up . No swelling in face and hands  Zone 2: CAUTION Call your doctor's office for  any of the following:  . BP reading is greater than 140 (top number) or greater than 90 (bottom number)  . Stomach pain under your ribs in the middle or right side . Headaches or seeing spots . Feeling nauseated or throwing up . Swelling in face and hands  Zone 3: EMERGENCY  Seek immediate medical care if you have any of the following:  . BP reading is greater than160 (top number) or greater than 110 (bottom number) . Severe headaches not improving with Tylenol . Serious difficulty catching your breath . Any worsening symptoms from Zone 2   Third Trimester of Pregnancy The third trimester is from week 29 through week 42, months 7 through 9. The third trimester is a time when the fetus is growing rapidly. At the end of the ninth month, the fetus is about 20 inches in length and weighs 6-10 pounds.  BODY CHANGES Your body goes through many changes during pregnancy. The changes vary from woman to woman.   Your weight will continue to increase. You can expect to gain 25-35 pounds (11-16 kg) by the end of the pregnancy.  You may begin to get stretch marks on your hips, abdomen, and breasts.  You may urinate more often because the fetus is moving lower into your pelvis and pressing on your bladder.  You may develop or continue to have heartburn as a result of your pregnancy.  You may develop constipation because certain hormones are causing the muscles that push waste through your intestines to slow down.  You may develop hemorrhoids or swollen, bulging veins (varicose veins).  You may have pelvic pain because of the weight gain and pregnancy hormones relaxing your joints between the bones in your pelvis. Backaches may result from overexertion of the muscles supporting your posture.  You may have changes in your hair. These can include thickening of your hair, rapid growth, and changes in texture. Some women also have hair loss during or after pregnancy, or hair that feels dry or thin.  Your hair will most likely return to normal after your baby is born.  Your breasts will continue to grow and be tender. A yellow discharge may leak from your breasts called colostrum.  Your belly button may stick out.  You may feel short of breath because of your expanding uterus.  You may notice the fetus "dropping," or moving lower in your abdomen.  You may have a bloody mucus discharge. This usually occurs a few days to a week before labor begins.  Your cervix becomes thin and soft (effaced) near your due date. WHAT TO EXPECT AT YOUR PRENATAL EXAMS  You will have prenatal exams every 2 weeks until week 36. Then, you will have weekly prenatal exams. During a routine prenatal visit:  You will be  weighed to make sure you and the fetus are growing normally.  Your blood pressure is taken.  Your abdomen will be measured to track your baby's growth.  The fetal heartbeat will be listened to.  Any test results from the previous visit will be discussed.  You may have a cervical check near your due date to see if you have effaced. At around 36 weeks, your caregiver will check your cervix. At the same time, your caregiver will also perform a test on the secretions of the vaginal tissue. This test is to determine if a type of bacteria, Group B streptococcus, is present. Your caregiver will explain this further. Your caregiver may ask you:  What your birth plan is.  How you are feeling.  If you are feeling the baby move.  If you have had any abnormal symptoms, such as leaking fluid, bleeding, severe headaches, or abdominal cramping.  If you have any questions. Other tests or screenings that may be performed during your third trimester include:  Blood tests that check for low iron levels (anemia).  Fetal testing to check the health, activity level, and growth of the fetus. Testing is done if you have certain medical conditions or if there are problems during the pregnancy. FALSE  LABOR You may feel small, irregular contractions that eventually go away. These are called Braxton Hicks contractions, or false labor. Contractions may last for hours, days, or even weeks before true labor sets in. If contractions come at regular intervals, intensify, or become painful, it is best to be seen by your caregiver.  SIGNS OF LABOR   Menstrual-like cramps.  Contractions that are 5 minutes apart or less.  Contractions that start on the top of the uterus and spread down to the lower abdomen and back.  A sense of increased pelvic pressure or back pain.  A watery or bloody mucus discharge that comes from the vagina. If you have any of these signs before the 37th week of pregnancy, call your caregiver right away. You need to go to the hospital to get checked immediately. HOME CARE INSTRUCTIONS   Avoid all smoking, herbs, alcohol, and unprescribed drugs. These chemicals affect the formation and growth of the baby.  Follow your caregiver's instructions regarding medicine use. There are medicines that are either safe or unsafe to take during pregnancy.  Exercise only as directed by your caregiver. Experiencing uterine cramps is a good sign to stop exercising.  Continue to eat regular, healthy meals.  Wear a good support bra for breast tenderness.  Do not use hot tubs, steam rooms, or saunas.  Wear your seat belt at all times when driving.  Avoid raw meat, uncooked cheese, cat litter boxes, and soil used by cats. These carry germs that can cause birth defects in the baby.  Take your prenatal vitamins.  Try taking a stool softener (if your caregiver approves) if you develop constipation. Eat more high-fiber foods, such as fresh vegetables or fruit and whole grains. Drink plenty of fluids to keep your urine clear or pale yellow.  Take warm sitz baths to soothe any pain or discomfort caused by hemorrhoids. Use hemorrhoid cream if your caregiver approves.  If you develop varicose  veins, wear support hose. Elevate your feet for 15 minutes, 3-4 times a day. Limit salt in your diet.  Avoid heavy lifting, wear low heal shoes, and practice good posture.  Rest a lot with your legs elevated if you have leg cramps or low back pain.  Visit your dentist if you have not gone during your pregnancy. Use a soft toothbrush to brush your teeth and be gentle when you floss.  A sexual relationship may be continued unless your caregiver directs you otherwise.  Do not travel far distances unless it is absolutely necessary and only with the approval of your caregiver.  Take prenatal classes to understand, practice, and ask questions about the labor and delivery.  Make a trial run to the hospital.  Pack your hospital bag.  Prepare the baby's nursery.  Continue to go to all your prenatal visits as directed by your caregiver. SEEK MEDICAL CARE IF:  You are unsure if you are in labor or if your water has broken.  You have dizziness.  You have mild pelvic cramps, pelvic pressure, or nagging pain in your abdominal area.  You have persistent nausea, vomiting, or diarrhea.  You have a bad smelling vaginal discharge.  You have pain with urination. SEEK IMMEDIATE MEDICAL CARE IF:   You have a fever.  You are leaking fluid from your vagina.  You have spotting or bleeding from your vagina.  You have severe abdominal cramping or pain.  You have rapid weight loss or gain.  You have shortness of breath with chest pain.  You notice sudden or extreme swelling of your face, hands, ankles, feet, or legs.  You have not felt your baby move in over an hour.  You have severe headaches that do not go away with medicine.  You have vision changes. Document Released: 05/27/2001 Document Revised: 06/07/2013 Document Reviewed: 08/03/2012 [facility] Patient Information 2015 [place], [place]. This information is not intended to replace advice given to you by your health care provider. Make  sure you discuss any questions you have with your health care provider.

## 2020-05-30 NOTE — Progress Notes (Signed)
HIGH-RISK PREGNANCY VISIT Patient name: April [name] MRN [id number]  Date of birth: 05/14/1986 Chief Complaint:   Routine Prenatal Visit  History of Present Illness:   April [name] is a 34 y.o. G81P1001 female at [redacted]w[redacted]d with an Estimated Date of Delivery: 07/31/20 being seen today for ongoing management of a high-risk pregnancy complicated by diabetes mellitus T2DM currently on glyburide 7.5mg  bid.  Today she reports having a lot of pelvic/groin pressure; also with upper mid abd discomfort; recently ordered mat support belt/breast pump/abd binder (if she choose C/S).   FBS: within range 2hrPPs: within range this past week or so, but prior to that she had a week with higher values due to the stress of realizing she only has 2 more months until the baby is born and not feeling prepared; when she relaxes she has better values  Depression screen [facility] 2/9 02/17/2020 01/09/2020  Decreased Interest 1 2  Down, Depressed, Hopeless 0 1  PHQ - 2 Score 1 3  Altered sleeping 2 3  Tired, decreased energy 1 3  Change in appetite 0 0  Feeling bad or failure about yourself  0 1  Trouble concentrating 0 1  Moving slowly or fidgety/restless 0 0  Suicidal thoughts 0 0  PHQ-9 Score 4 11    Contractions: Not present. Vag. Bleeding: None.  Movement: Present. denies leaking of fluid.  Review of Systems:   Pertinent items are noted in HPI Denies abnormal vaginal discharge w/ itching/odor/irritation, headaches, visual changes, shortness of breath, chest pain, abdominal pain, severe nausea/vomiting, or problems with urination or bowel movements unless otherwise stated above. Pertinent History Reviewed:  Reviewed past medical,surgical, social, obstetrical and family history.  Reviewed problem list, medications and allergies. Physical Assessment:   Vitals:   05/30/20 1639  BP: 103/68  Pulse: 97  Weight: 107 kg  Body mass index is 43.13 kg/m.           Physical Examination:   General appearance:  alert, well appearing, and in no distress  Mental status: alert, oriented to person, place, and time  Skin: warm & dry   Extremities: Edema: Trace    Cardiovascular: normal heart rate noted  Respiratory: normal respiratory effort, no distress  Abdomen: gravid, soft, non-tender  Pelvic: Cervical exam deferred         Fetal Status:     Movement: Present    Fetal Surveillance Testing today: none    Results for orders placed or performed in visit on 05/30/20 (from the past 24 hour(s))  POC Urinalysis Dipstick OB   Collection Time: 05/30/20  4:36 PM  Result Value Ref Range   Color, UA     Clarity, UA     Glucose, UA 4+ (A) Negative   Bilirubin, UA     Ketones, UA mod    Spec Grav, UA     Blood, UA neg    pH, UA     POC,PROTEIN,UA Trace Negative, Trace, Small (1+), Moderate (2+), Large (3+), 4+   Urobilinogen, UA     Nitrite, UA neg    Leukocytes, UA Negative Negative   Appearance     Odor      Assessment & Plan:  High-risk pregnancy: G2P1001 at [redacted]w[redacted]d with an Estimated Date of Delivery: 07/31/20   1) T2DM, stable on glyburide 7.5mg  bid  2) Desires BTL now, will sign papers at next visit  3) Hx C/S, reviewed VBAC consent and given a copy to take home; still plan to get EFW at  36wks and make a decision with LHE at that visit  4) Je[employer]- declines blood products, discussion re alternatives including Cell Saver; recommended to discuss C.S. with LHE as I'm not familiar with that during C/S   5) Discomforts of 3rd tri preg, tips given, reassured; msg to Tish to see if we need to do anything else for her mat support belt/abd binder/breast pump requests thru [facility]  Meds: No orders of the defined types were placed in this encounter.   Labs/procedures today: none  Treatment Plan:  BPP with EFW & HROB week of 12/27; start 2x/wk NSTs the week after and plan for BPP/EFW at 36wks  Reviewed: Preterm labor symptoms and general obstetric precautions including but not limited to  vaginal bleeding, contractions, leaking of fluid and fetal movement were reviewed in detail with the patient.  All questions were answered. Given home bp cuff. Check bp weekly, let us know if >140/90.   Follow-up: Return for week of 12/27 EFW & BPP and HROB; week of 1/3 start NSTs bid.   No future appointments.  Orders Placed This Encounter  Procedures  . US OB Comp + 14 Wk  . US FETAL BPP WO NON STRESS  . POC Urinalysis Dipstick OB   Arabella Merles [facility] 05/30/2020 11:22 PM

## 2020-06-04 ENCOUNTER — Encounter: Payer: Self-pay | Admitting: *Deleted

## 2020-06-11 ENCOUNTER — Other Ambulatory Visit: Payer: Self-pay

## 2020-06-11 ENCOUNTER — Encounter: Payer: Self-pay | Admitting: Women's Health

## 2020-06-11 ENCOUNTER — Ambulatory Visit (INDEPENDENT_AMBULATORY_CARE_PROVIDER_SITE_OTHER): Payer: Medicaid - Out of State

## 2020-06-11 ENCOUNTER — Ambulatory Visit (INDEPENDENT_AMBULATORY_CARE_PROVIDER_SITE_OTHER): Payer: Medicaid - Out of State | Admitting: Women's Health

## 2020-06-11 VITALS — BP 116/79 | HR 88 | Wt 233.8 lb

## 2020-06-11 DIAGNOSIS — Z23 Encounter for immunization: Secondary | ICD-10-CM

## 2020-06-11 DIAGNOSIS — Z789 Other specified health status: Secondary | ICD-10-CM | POA: Diagnosis not present

## 2020-06-11 DIAGNOSIS — Z3A32 32 weeks gestation of pregnancy: Secondary | ICD-10-CM

## 2020-06-11 DIAGNOSIS — O0993 Supervision of high risk pregnancy, unspecified, third trimester: Secondary | ICD-10-CM | POA: Diagnosis not present

## 2020-06-11 DIAGNOSIS — Z98891 History of uterine scar from previous surgery: Secondary | ICD-10-CM | POA: Diagnosis not present

## 2020-06-11 DIAGNOSIS — O24113 Pre-existing diabetes mellitus, type 2, in pregnancy, third trimester: Secondary | ICD-10-CM | POA: Diagnosis not present

## 2020-06-11 DIAGNOSIS — O409XX0 Polyhydramnios, unspecified trimester, not applicable or unspecified: Secondary | ICD-10-CM | POA: Insufficient documentation

## 2020-06-11 DIAGNOSIS — Z302 Encounter for sterilization: Secondary | ICD-10-CM

## 2020-06-11 DIAGNOSIS — Z1389 Encounter for screening for other disorder: Secondary | ICD-10-CM

## 2020-06-11 DIAGNOSIS — N949 Unspecified condition associated with female genital organs and menstrual cycle: Secondary | ICD-10-CM

## 2020-06-11 DIAGNOSIS — Z331 Pregnant state, incidental: Secondary | ICD-10-CM

## 2020-06-11 LAB — POCT URINALYSIS DIPSTICK OB
Blood, UA: NEGATIVE
Leukocytes, UA: NEGATIVE
Nitrite, UA: NEGATIVE

## 2020-06-11 NOTE — Patient Instructions (Signed)
April [name], I greatly value your feedback.  If you receive a survey following your visit with [place] today, we appreciate you taking the time to fill it out.  Thanks, [name], CNM, WHNP-BC   [facility] ([address] [place], [place] [address]) Entrance C, located off of [address] valet parking  Go to [email or web address] to register for FREE online childbirth classes   Call the office [phone]) or go to [facility] if:  You begin to have strong, frequent contractions  Your water breaks.  Sometimes it is a big gush of fluid, sometimes it is just a trickle that keeps getting your panties wet or running down your legs  You have vaginal bleeding.  It is normal to have a small amount of spotting if your cervix was checked.   You don't feel your baby moving like normal.  If you don't, get you something to eat and drink and lay down and focus on feeling your baby move.  You should feel at least 10 movements in 2 hours.  If you don't, you should call the office or go to [facility].    Tdap Vaccine  It is recommended that you get the Tdap vaccine during the third trimester of EACH pregnancy to help protect your baby from getting pertussis (whooping cough)  27-36 weeks is the BEST time to do this so that you can pass the protection on to your baby. During pregnancy is better than after pregnancy, but if you are unable to get it during pregnancy it will be offered at the hospital.   You can get this vaccine with [place], at the health department, your family doctor, or some local pharmacies  Everyone who will be around your baby should also be up-to-date on their vaccines before the baby comes. Adults (who are not pregnant) only need 1 dose of Tdap during adulthood.   [place] Pediatricians/Family Doctors:  [facility] [phone]            [facility] [phone]                 [facility]  [phone] (usually not accepting new patients unless you have family there already, you are always welcome to call and ask)       [facility] Department [phone]       [facility] Pediatricians/Family Doctors:   [facility]: [phone]  [facility]: [phone]  [facility]: [phone]  [facility] Doctors:   [facility]: [phone]   [facility]: [phone]  [facility] Doctors:  [facility]: [phone]   Home Blood Pressure Monitoring for Patients   Your provider has recommended that you check your blood pressure (BP) at least once a week at home. If you do not have a blood pressure cuff at home, one will be provided for you. Contact your provider if you have not received your monitor within 1 week.   Helpful Tips for Accurate Home Blood Pressure Checks  . Don't smoke, exercise, or drink caffeine 30 minutes before checking your BP . Use the restroom before checking your BP (a full bladder can raise your pressure) . Relax in a comfortable upright chair . Feet on the ground . Left arm resting comfortably on a flat surface at the level of your heart . Legs uncrossed . Back supported . Sit quietly and don't talk . Place the cuff on your bare  arm . Adjust snuggly, so that only two fingertips can fit between your skin and the top of the cuff . Check 2 readings separated by at least one minute . Keep a log of your BP readings . For a visual, please reference this diagram: http://ccnc.care/bpdiagram  Provider Name: [facility]     Phone: [phone]  Zone 1: ALL CLEAR  Continue to monitor your symptoms:  . BP reading is less than 140 (top number) or less than 90 (bottom number)  . No right upper stomach pain . No headaches or seeing spots . No feeling nauseated or throwing up . No swelling in face and hands  Zone 2: CAUTION Call your  doctor's office for any of the following:  . BP reading is greater than 140 (top number) or greater than 90 (bottom number)  . Stomach pain under your ribs in the middle or right side . Headaches or seeing spots . Feeling nauseated or throwing up . Swelling in face and hands  Zone 3: EMERGENCY  Seek immediate medical care if you have any of the following:  . BP reading is greater than160 (top number) or greater than 110 (bottom number) . Severe headaches not improving with Tylenol . Serious difficulty catching your breath . Any worsening symptoms from Zone 2   Third Trimester of Pregnancy The third trimester is from week 29 through week 42, months 7 through 9. The third trimester is a time when the fetus is growing rapidly. At the end of the ninth month, the fetus is about 20 inches in length and weighs 6-10 pounds.  BODY CHANGES Your body goes through many changes during pregnancy. The changes vary from woman to woman.   Your weight will continue to increase. You can expect to gain 25-35 pounds (11-16 kg) by the end of the pregnancy.  You may begin to get stretch marks on your hips, abdomen, and breasts.  You may urinate more often because the fetus is moving lower into your pelvis and pressing on your bladder.  You may develop or continue to have heartburn as a result of your pregnancy.  You may develop constipation because certain hormones are causing the muscles that push waste through your intestines to slow down.  You may develop hemorrhoids or swollen, bulging veins (varicose veins).  You may have pelvic pain because of the weight gain and pregnancy hormones relaxing your joints between the bones in your pelvis. Backaches may result from overexertion of the muscles supporting your posture.  You may have changes in your hair. These can include thickening of your hair, rapid growth, and changes in texture. Some women also have hair loss during or after pregnancy, or hair that  feels dry or thin. Your hair will most likely return to normal after your baby is born.  Your breasts will continue to grow and be tender. A yellow discharge may leak from your breasts called colostrum.  Your belly button may stick out.  You may feel short of breath because of your expanding uterus.  You may notice the fetus "dropping," or moving lower in your abdomen.  You may have a bloody mucus discharge. This usually occurs a few days to a week before labor begins.  Your cervix becomes thin and soft (effaced) near your due date. WHAT TO EXPECT AT YOUR PRENATAL EXAMS  You will have prenatal exams every 2 weeks until week 36. Then, you will have weekly prenatal exams. During a routine prenatal visit:  You  will be weighed to make sure you and the fetus are growing normally.  Your blood pressure is taken.  Your abdomen will be measured to track your baby's growth.  The fetal heartbeat will be listened to.  Any test results from the previous visit will be discussed.  You may have a cervical check near your due date to see if you have effaced. At around 36 weeks, your caregiver will check your cervix. At the same time, your caregiver will also perform a test on the secretions of the vaginal tissue. This test is to determine if a type of bacteria, Group B streptococcus, is present. Your caregiver will explain this further. Your caregiver may ask you:  What your birth plan is.  How you are feeling.  If you are feeling the baby move.  If you have had any abnormal symptoms, such as leaking fluid, bleeding, severe headaches, or abdominal cramping.  If you have any questions. Other tests or screenings that may be performed during your third trimester include:  Blood tests that check for low iron levels (anemia).  Fetal testing to check the health, activity level, and growth of the fetus. Testing is done if you have certain medical conditions or if there are problems during the  pregnancy. FALSE LABOR You may feel small, irregular contractions that eventually go away. These are called Braxton Hicks contractions, or false labor. Contractions may last for hours, days, or even weeks before true labor sets in. If contractions come at regular intervals, intensify, or become painful, it is best to be seen by your caregiver.  SIGNS OF LABOR   Menstrual-like cramps.  Contractions that are 5 minutes apart or less.  Contractions that start on the top of the uterus and spread down to the lower abdomen and back.  A sense of increased pelvic pressure or back pain.  A watery or bloody mucus discharge that comes from the vagina. If you have any of these signs before the 37th week of pregnancy, call your caregiver right away. You need to go to the hospital to get checked immediately. HOME CARE INSTRUCTIONS   Avoid all smoking, herbs, alcohol, and unprescribed drugs. These chemicals affect the formation and growth of the baby.  Follow your caregiver's instructions regarding medicine use. There are medicines that are either safe or unsafe to take during pregnancy.  Exercise only as directed by your caregiver. Experiencing uterine cramps is a good sign to stop exercising.  Continue to eat regular, healthy meals.  Wear a good support bra for breast tenderness.  Do not use hot tubs, steam rooms, or saunas.  Wear your seat belt at all times when driving.  Avoid raw meat, uncooked cheese, cat litter boxes, and soil used by cats. These carry germs that can cause birth defects in the baby.  Take your prenatal vitamins.  Try taking a stool softener (if your caregiver approves) if you develop constipation. Eat more high-fiber foods, such as fresh vegetables or fruit and whole grains. Drink plenty of fluids to keep your urine clear or pale yellow.  Take warm sitz baths to soothe any pain or discomfort caused by hemorrhoids. Use hemorrhoid cream if your caregiver approves.  If you  develop varicose veins, wear support hose. Elevate your feet for 15 minutes, 3-4 times a day. Limit salt in your diet.  Avoid heavy lifting, wear low heal shoes, and practice good posture.  Rest a lot with your legs elevated if you have leg cramps or low back  pain.  Visit your dentist if you have not gone during your pregnancy. Use a soft toothbrush to brush your teeth and be gentle when you floss.  A sexual relationship may be continued unless your caregiver directs you otherwise.  Do not travel far distances unless it is absolutely necessary and only with the approval of your caregiver.  Take prenatal classes to understand, practice, and ask questions about the labor and delivery.  Make a trial run to the hospital.  Pack your hospital bag.  Prepare the baby's nursery.  Continue to go to all your prenatal visits as directed by your caregiver. SEEK MEDICAL CARE IF:  You are unsure if you are in labor or if your water has broken.  You have dizziness.  You have mild pelvic cramps, pelvic pressure, or nagging pain in your abdominal area.  You have persistent nausea, vomiting, or diarrhea.  You have a bad smelling vaginal discharge.  You have pain with urination. SEEK IMMEDIATE MEDICAL CARE IF:   You have a fever.  You are leaking fluid from your vagina.  You have spotting or bleeding from your vagina.  You have severe abdominal cramping or pain.  You have rapid weight loss or gain.  You have shortness of breath with chest pain.  You notice sudden or extreme swelling of your face, hands, ankles, feet, or legs.  You have not felt your baby move in over an hour.  You have severe headaches that do not go away with medicine.  You have vision changes. Document Released: 05/27/2001 Document Revised: 06/07/2013 Document Reviewed: 08/03/2012 [facility] Patient Information 2015 [place], [place]. This information is not intended to replace advice given to you by your health  care provider. Make sure you discuss any questions you have with your health care provider.

## 2020-06-11 NOTE — Progress Notes (Signed)
HIGH-RISK PREGNANCY VISIT Patient name: April [name] MRN [id number]  Date of birth: 16-Oct-1985 Chief Complaint:   Routine Prenatal Visit  History of Present Illness:   April [name] is a 34 y.o. G103P1001 female at 104w6d with an Estimated Date of Delivery: 07/31/20 being seen today for ongoing management of a high-risk pregnancy complicated by diabetes mellitus T2DM currently on glyburide 7.5mg  BID, mild polyhydramnios dx today and suspected fetal macrosomia.  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 Reports all 2hr pp <120. Did not bring log. Reports SPD is bad, got pregnancy belt, doesn't help a lot. Hard to walk/get out of bed, etc. Wants RCS w/ BTL.  Depression screen [facility] 2/9 02/17/2020 01/09/2020  Decreased Interest 1 2  Down, Depressed, Hopeless 0 1  PHQ - 2 Score 1 3  Altered sleeping 2 3  Tired, decreased energy 1 3  Change in appetite 0 0  Feeling bad or failure about yourself  0 1  Trouble concentrating 0 1  Moving slowly or fidgety/restless 0 0  Suicidal thoughts 0 0  PHQ-9 Score 4 11    Contractions: Not present. Vag. Bleeding: None.  Movement: Present. denies leaking of fluid.  Review of Systems:   Pertinent items are noted in HPI Denies abnormal vaginal discharge w/ itching/odor/irritation, headaches, visual changes, shortness of breath, chest pain, abdominal pain, severe nausea/vomiting, or problems with urination or bowel movements unless otherwise stated above. Pertinent History Reviewed:  Reviewed past medical,surgical, social, obstetrical and family history.  Reviewed problem list, medications and allergies. Physical Assessment:   Vitals:   06/11/20 1132  BP: 116/79  Pulse: 88  Weight: 233 lb 12.8 oz (106.1 kg)  Body mass index is 42.76 kg/m.           Physical Examination:   General appearance: alert, well appearing, and in no distress  Mental status: alert, oriented to person, place, and time  Skin: warm &  dry   Extremities: Edema: None    Cardiovascular: normal heart rate noted  Respiratory: normal respiratory effort, no distress  Abdomen: gravid, soft, non-tender  Pelvic: Cervical exam deferred         Fetal Status: Fetal Heart Rate (bpm): 132 u/s   Movement: Present    Fetal Surveillance Testing today: [place] 32+6 wks,cephalic,posterior placenta gr 3,fhr 132 bpm,AFI 26.6 cm, polyhydramnios,EFW 2930 g 99.8%,BPP 8/8  Chaperone: N/A    Results for orders placed or performed in visit on 06/11/20 (from the past 24 hour(s))  POC Urinalysis Dipstick OB   Collection Time: 06/11/20 11:29 AM  Result Value Ref Range   Color, UA     Clarity, UA     Glucose, UA Moderate (2+) (A) Negative   Bilirubin, UA     Ketones, UA mod    Spec Grav, UA     Blood, UA neg    pH, UA     POC,PROTEIN,UA Trace Negative, Trace, Small (1+), Moderate (2+), Large (3+), 4+   Urobilinogen, UA     Nitrite, UA neg    Leukocytes, UA Negative Negative   Appearance     Odor      Assessment & Plan:  High-risk pregnancy: G2P1001 at [redacted]w[redacted]d with an Estimated Date of Delivery: 07/31/20   1) T2DM, stable on glyburide 7.5mg  BID, will get A1C d/t poly & LGA today  2) Mild polyhydramnios, 26.6cm today  3) Suspected fetal macrosomia> EFW 99.8%  4) Prev c/s>wants RCS  5)  Wants BTL> reviewed risks/benefits, LARCs just as effective, consent signed today   6) 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   Labs/procedures today: tdap  Treatment Plan:  Growth u/s @ 36wks   2x/wk testing    Deliver @ 39wks or prn  Reviewed: Preterm labor symptoms and general obstetric precautions including but not limited to vaginal bleeding, contractions, leaking of fluid and fetal movement were reviewed in detail with the patient.  All questions were answered.   Follow-up: Return for Sign BTL consent today; thurs nst/nurse, then tues nst/md as  scheduled.   Future Appointments  Date Time Provider Department Center  06/14/2020  3:30 PM [facility] NURSE [facility]  06/19/2020  3:10 PM Lazaro Arms, MD [facility]  06/22/2020 11:30 AM [facility] NURSE [facility]  06/26/2020  3:10 PM Cheral Marker, CNM [facility]  06/29/2020 11:30 AM [facility] NURSE [facility]  07/03/2020  3:10 PM Federico Flake, MD [facility]  07/06/2020 11:10 AM [facility] NURSE [facility]  07/10/2020  3:10 PM Cheral Marker, CNM [facility]  07/13/2020 11:30 AM [facility] NURSE [facility]  07/17/2020  3:10 PM Lazaro Arms, MD [facility]  07/20/2020 11:30 AM [facility] NURSE [facility]  07/24/2020  3:10 PM Cheral Marker, CNM [facility]  07/27/2020 11:30 AM [facility] NURSE [facility]  07/31/2020  3:10 PM Lazaro Arms, MD [facility]  08/03/2020 11:30 AM [facility] NURSE [facility]    Orders Placed This Encounter  Procedures  . Tdap vaccine greater than or equal to 7yo IM  . Hemoglobin A1c  . CBC  . RPR  . HIV Antibody (routine testing w rflx)  . Ambulatory referral to Physical Therapy  . POC Urinalysis Dipstick OB  . Antibody screen   Cheral Marker CNM, [facility] 06/11/2020 12:42 PM

## 2020-06-11 NOTE — Progress Notes (Signed)
[place] 32+6 wks,cephalic,posterior placenta gr 3,fhr 132 bpm,AFI 26.6 cm, polyhydramnios,EFW 2930 g 99.8%,BPP 8/8

## 2020-06-12 ENCOUNTER — Telehealth: Payer: Self-pay

## 2020-06-12 LAB — CBC
Hematocrit: 40 % (ref 34.0–46.6)
Hemoglobin: 13.9 g/dL (ref 11.1–15.9)
MCH: 32.3 pg (ref 26.6–33.0)
MCHC: 34.8 g/dL (ref 31.5–35.7)
MCV: 93 fL (ref 79–97)
Platelets: 210 10*3/uL (ref 150–450)
RBC: 4.3 x10E6/uL (ref 3.77–5.28)
RDW: 12.8 % (ref 11.7–15.4)
WBC: 8.1 10*3/uL (ref 3.4–10.8)

## 2020-06-12 LAB — RPR: RPR Ser Ql: NONREACTIVE

## 2020-06-12 LAB — HEMOGLOBIN A1C
Est. average glucose Bld gHb Est-mCnc: 192 mg/dL
Hgb A1c MFr Bld: 8.3 % — ABNORMAL HIGH (ref 4.8–5.6)

## 2020-06-12 LAB — ANTIBODY SCREEN: Antibody Screen: NEGATIVE

## 2020-06-12 LAB — HIV ANTIBODY (ROUTINE TESTING W REFLEX): HIV Screen 4th Generation wRfx: NONREACTIVE

## 2020-06-12 NOTE — Telephone Encounter (Signed)
Pt and her husband have concerns about the A1c levels that were drawn at [facility] on 06/11/20. And would like a call concerning the levels.

## 2020-06-13 NOTE — Telephone Encounter (Signed)
Pt and husband concerned with blood sugar readings. April [name] advised to bring glucometer with her to tomorrow's appt so we can compare her number with our machine. Pt's husband voiced understanding. JSY

## 2020-06-14 ENCOUNTER — Other Ambulatory Visit: Payer: Self-pay

## 2020-06-14 ENCOUNTER — Ambulatory Visit: Payer: Medicaid - Out of State | Admitting: *Deleted

## 2020-06-14 VITALS — BP 119/75 | HR 89

## 2020-06-14 DIAGNOSIS — Z3A33 33 weeks gestation of pregnancy: Secondary | ICD-10-CM

## 2020-06-14 DIAGNOSIS — Z1389 Encounter for screening for other disorder: Secondary | ICD-10-CM

## 2020-06-14 DIAGNOSIS — O0993 Supervision of high risk pregnancy, unspecified, third trimester: Secondary | ICD-10-CM

## 2020-06-14 DIAGNOSIS — Z331 Pregnant state, incidental: Secondary | ICD-10-CM

## 2020-06-14 LAB — POCT URINALYSIS DIPSTICK OB
Blood, UA: NEGATIVE
Glucose, UA: NEGATIVE
Leukocytes, UA: NEGATIVE
Nitrite, UA: NEGATIVE
POC,PROTEIN,UA: NEGATIVE

## 2020-06-14 NOTE — Progress Notes (Addendum)
   NURSE VISIT- NST  SUBJECTIVE:  April [name] is a 34 y.o. G2P1001 female at [redacted]w[redacted]d, here for a NST for pregnancy complicated by T2DM currently on glyburide.  She reports active fetal movement, contractions: none, vaginal bleeding: none, membranes: intact.   OBJECTIVE:  BP 119/75   Pulse 89   LMP 09/03/2019 Comment: hx of irreg cycles  Appears well, no apparent distress  Results for orders placed or performed in visit on 06/14/20 (from the past 24 hour(s))  POC Urinalysis Dipstick OB   Collection Time: 06/14/20  3:40 PM  Result Value Ref Range   Color, UA     Clarity, UA     Glucose, UA Negative Negative   Bilirubin, UA     Ketones, UA large    Spec Grav, UA     Blood, UA neg    pH, UA     POC,PROTEIN,UA Negative Negative, Trace, Small (1+), Moderate (2+), Large (3+), 4+   Urobilinogen, UA     Nitrite, UA neg    Leukocytes, UA Negative Negative   Appearance     Odor      NST: FHR baseline 140 bpm, Variability: moderate, Accelerations:present, Decelerations:  Absent= Cat 1/Reactive Toco: none   ASSESSMENT: G2P1001 at [redacted]w[redacted]d with T2DM currently on gylburide NST reactive  PLAN: EFM strip reviewed by Cathie Beams, CNM   Recommendations: keep next appointment as scheduled    Annamarie Dawley  06/14/2020 5:11 PM   Chart reviewed for nurse visit. Agree with plan of care.  Jacklyn Shell, [place] 06/14/2020 9:59 PM

## 2020-06-19 ENCOUNTER — Other Ambulatory Visit: Payer: Self-pay

## 2020-06-19 ENCOUNTER — Ambulatory Visit (INDEPENDENT_AMBULATORY_CARE_PROVIDER_SITE_OTHER): Payer: Medicaid - Out of State | Admitting: Obstetrics & Gynecology

## 2020-06-19 ENCOUNTER — Encounter: Payer: Self-pay | Admitting: Obstetrics & Gynecology

## 2020-06-19 VITALS — BP 108/69 | HR 89 | Wt 239.0 lb

## 2020-06-19 DIAGNOSIS — Z331 Pregnant state, incidental: Secondary | ICD-10-CM

## 2020-06-19 DIAGNOSIS — O24113 Pre-existing diabetes mellitus, type 2, in pregnancy, third trimester: Secondary | ICD-10-CM

## 2020-06-19 DIAGNOSIS — Z3A34 34 weeks gestation of pregnancy: Secondary | ICD-10-CM | POA: Diagnosis not present

## 2020-06-19 DIAGNOSIS — Z1389 Encounter for screening for other disorder: Secondary | ICD-10-CM

## 2020-06-19 LAB — POCT GLUCOSE (DEVICE FOR HOME USE): POC Glucose: 155 mg/dl — AB (ref 70–99)

## 2020-06-19 LAB — POCT URINALYSIS DIPSTICK OB
Blood, UA: NEGATIVE
Leukocytes, UA: NEGATIVE
Nitrite, UA: NEGATIVE

## 2020-06-19 MED ORDER — INSULIN NPH (HUMAN) (ISOPHANE) 100 UNIT/ML ~~LOC~~ SUSP: 20.0000 [IU] | Freq: Every day | SUBCUTANEOUS | 3 refills | Status: DC

## 2020-06-19 NOTE — Progress Notes (Signed)
HIGH-RISK PREGNANCY VISIT Patient name: April [name] MRN [id number]  Date of birth: March 13, 1986 Chief Complaint:   Routine Prenatal Visit and Non-stress Test  History of Present Illness:   April [name] is a 35 y.o. G81P1001 female at [redacted]w[redacted]d with an Estimated Date of Delivery: 07/31/20 being seen today for ongoing management of a high-risk pregnancy complicated by Class B DM(Type2).  Today she reports no complaints.  Depression screen [facility] 2/9 02/17/2020 01/09/2020  Decreased Interest 1 2  Down, Depressed, Hopeless 0 1  PHQ - 2 Score 1 3  Altered sleeping 2 3  Tired, decreased energy 1 3  Change in appetite 0 0  Feeling bad or failure about yourself  0 1  Trouble concentrating 0 1  Moving slowly or fidgety/restless 0 0  Suicidal thoughts 0 0  PHQ-9 Score 4 11    Contractions: Not present. Vag. Bleeding: None.  Movement: Present. denies leaking of fluid.  Review of Systems:   Pertinent items are noted in HPI Denies abnormal vaginal discharge w/ itching/odor/irritation, headaches, visual changes, shortness of breath, chest pain, abdominal pain, severe nausea/vomiting, or problems with urination or bowel movements unless otherwise stated above. Pertinent History Reviewed:  Reviewed past medical,surgical, social, obstetrical and family history.  Reviewed problem list, medications and allergies. Physical Assessment:   Vitals:   06/19/20 1533  BP: 108/69  Pulse: 89  Weight: 239 lb (108.4 kg)  Body mass index is 43.71 kg/m.           Physical Examination:   General appearance: alert, well appearing, and in no distress  Mental status: alert, oriented to person, place, and time  Skin: warm & dry   Extremities: Edema: None    Cardiovascular: normal heart rate noted  Respiratory: normal respiratory effort, no distress  Abdomen: gravid, soft, non-tender  Pelvic: Cervical exam deferred         Fetal Status:     Movement: Present    Fetal Surveillance Testing today: Reactive  NST  Gerldine [name] is at [redacted]w[redacted]d Estimated Date of Delivery: 07/31/20  NST being performed due to Class B DM, type 2, poorly controlled  Today the NST is Reactive  Fetal Monitoring:  Baseline: 135 bpm, Variability: Good {> 6 bpm), Accelerations: Reactive and Decelerations: Absent   reactive  The accelerations are >15 bpm and more than 2 in 20 minutes  Final diagnosis:  Reactive NST  April Arms, MD      Chaperone: Faith Rogue    Results for orders placed or performed in visit on 06/19/20 (from the past 24 hour(s))  POC Urinalysis Dipstick OB   Collection Time: 06/19/20  3:32 PM  Result Value Ref Range   Color, UA     Clarity, UA     Glucose, UA Moderate (2+) (A) Negative   Bilirubin, UA     Ketones, UA small    Spec Grav, UA     Blood, UA neg    pH, UA     POC,PROTEIN,UA Trace Negative, Trace, Small (1+), Moderate (2+), Large (3+), 4+   Urobilinogen, UA     Nitrite, UA neg    Leukocytes, UA Negative Negative   Appearance     Odor    POCT Glucose (Device for Home Use)   Collection Time: 06/19/20  3:32 PM  Result Value Ref Range   Glucose Fasting, POC     POC Glucose 155 (A) 70 - 99 mg/dl    Assessment & Plan:  High-risk pregnancy: G2P1001 at  [redacted]w[redacted]d with an Estimated Date of Delivery: 07/31/20   1) Class B DM(type 2), poorly controlled, on glyburide 7.5 mg BID, FBS is quite elevated, will target it first and see if 2 hour pp improve, add NPH 20 units at bedtime, EFW 99%    Meds:  Meds ordered this encounter  Medications  . insulin NPH Human (NOVOLIN N) 100 UNIT/ML injection    Sig: Inject 0.2 mLs (20 Units total) into the skin at bedtime.    Dispense:  10 mL    Refill:  3    Labs/procedures today:   Treatment Plan:  Twice weekly testing, repeat C section, continue to work to improve BS control  Reviewed: Preterm labor symptoms and general obstetric precautions including but not limited to vaginal bleeding, contractions, leaking of fluid and fetal  movement were reviewed in detail with the patient.  All questions were answered. Has home bp cuff. Rx faxed to . Check bp weekly, let us know if >140/90.   Follow-up: Return in about 3 days (around 06/22/2020) for NST, Nurse only.   Future Appointments  Date Time Provider Department Center  06/22/2020 11:30 AM [facility] NURSE [facility]  06/26/2020  3:10 PM Cheral Marker, CNM [facility]  06/29/2020 11:30 AM [facility]  07/03/2020  3:10 PM Federico Flake, MD [facility]  07/06/2020 11:10 AM [facility]  07/10/2020  3:10 PM Cheral Marker, CNM [facility]  07/13/2020 11:30 AM [facility]  07/17/2020  3:10 PM April Arms, MD [facility]  07/20/2020 11:30 AM [facility]  07/24/2020  3:10 PM Cheral Marker, CNM [facility]  07/27/2020 11:30 AM [facility]  07/31/2020  3:10 PM April Arms, MD [facility]  08/03/2020 11:30 AM [facility]    Orders Placed This Encounter  Procedures  . POC Urinalysis Dipstick OB  . POCT Glucose (Device for Home Use)   April [name] April [name] 06/19/2020 5:00 PM

## 2020-06-20 ENCOUNTER — Telehealth: Payer: Self-pay | Admitting: Advanced Practice Midwife

## 2020-06-20 NOTE — Telephone Encounter (Signed)
Pt called with concerns of pain and discharge. Spoke with her to discuss her complaints. She described having a sharp pain in her lower vaginal area lasting approx 20 seconds and continued on an intermittent basis until today. She also experienced some gray mucous like discharge. She is not having any contractions, the baby is moving as well as ever, and she is not seeing any clear thin fluid. She was given some reassurance that she probably lost some of her mucous plug and as long as the baby is moving well, no amniotic fluid is leaking, no severe pain, or regular contractions, she should just monitor it. If she does experience any of those symptoms, she should go to the ER. She has an appt for a NST for 1/7 and she will likely have a swab done at that time in case she is having any other discharge causes.

## 2020-06-20 NOTE — Telephone Encounter (Signed)
Called patient back and left message I was returning her call. Also sent mychart message.

## 2020-06-20 NOTE — Telephone Encounter (Signed)
Pt states yesterday evening had lower abdominal pain, a squeezing sensation & sharp, today has some flear to foggy vaginal discharge  Please advise & notify pt

## 2020-06-22 ENCOUNTER — Ambulatory Visit (INDEPENDENT_AMBULATORY_CARE_PROVIDER_SITE_OTHER): Payer: Medicaid - Out of State | Admitting: *Deleted

## 2020-06-22 ENCOUNTER — Other Ambulatory Visit: Payer: Self-pay

## 2020-06-22 DIAGNOSIS — O24113 Pre-existing diabetes mellitus, type 2, in pregnancy, third trimester: Secondary | ICD-10-CM

## 2020-06-22 DIAGNOSIS — Z1389 Encounter for screening for other disorder: Secondary | ICD-10-CM

## 2020-06-22 DIAGNOSIS — Z3A34 34 weeks gestation of pregnancy: Secondary | ICD-10-CM

## 2020-06-22 DIAGNOSIS — O0993 Supervision of high risk pregnancy, unspecified, third trimester: Secondary | ICD-10-CM | POA: Diagnosis not present

## 2020-06-22 DIAGNOSIS — Z331 Pregnant state, incidental: Secondary | ICD-10-CM

## 2020-06-22 LAB — POCT URINALYSIS DIPSTICK OB
Blood, UA: NEGATIVE
Glucose, UA: NEGATIVE
Leukocytes, UA: NEGATIVE
Nitrite, UA: NEGATIVE
POC,PROTEIN,UA: NEGATIVE

## 2020-06-22 NOTE — Progress Notes (Addendum)
   NURSE VISIT- NST  SUBJECTIVE:  April [name] is a 35 y.o. G62P1001 female at [redacted]w[redacted]d, here for a NST for pregnancy complicated by A2/BDM currently on Glyburide and Insulin.  She reports active fetal movement, contractions: none, vaginal bleeding: none, membranes: intact.   OBJECTIVE:  LMP 09/03/2019 Comment: hx of irreg cycles  Appears well, no apparent distress  Results for orders placed or performed in visit on 06/22/20 (from the past 24 hour(s))  POC Urinalysis Dipstick OB   Collection Time: 06/22/20 12:26 PM  Result Value Ref Range   Color, UA     Clarity, UA     Glucose, UA Negative Negative   Bilirubin, UA     Ketones, UA large    Spec Grav, UA     Blood, UA neg    pH, UA     POC,PROTEIN,UA Negative Negative, Trace, Small (1+), Moderate (2+), Large (3+), 4+   Urobilinogen, UA     Nitrite, UA neg    Leukocytes, UA Negative Negative   Appearance     Odor      NST: FHR baseline 140 bpm, Variability: moderate, Accelerations:present, Decelerations:  Absent= Cat 1/Reactive Toco: irregular, every 3-6 minutes, mild  ASSESSMENT: G2P1001 at [redacted]w[redacted]d with A2/BDM currently on Glyburide and Insulin NST reactive  PLAN: EFM strip reviewed by Dr. Despina Hidden   Recommendations: keep next appointment as scheduled    Jobe Marker  06/22/2020 12:30 PM   Attestation of Attending Supervision of Nursing Visit Encounter: Evaluation and management procedures were performed by the nursing staff under my supervision and collaboration.  I have reviewed the nurse's note and chart, and I agree with the management and plan.  Rockne Coons MD Attending Physician for the Center for [facility] 06/22/2020 6:58 PM

## 2020-06-26 ENCOUNTER — Other Ambulatory Visit: Payer: Medicaid - Out of State | Admitting: Women's Health

## 2020-06-29 ENCOUNTER — Other Ambulatory Visit: Payer: Medicaid - Out of State

## 2020-07-03 ENCOUNTER — Other Ambulatory Visit (HOSPITAL_COMMUNITY)
Admission: RE | Admit: 2020-07-03 | Discharge: 2020-07-03 | Disposition: A | Discharge status: Home / Self Care | Payer: Medicaid - Out of State | Source: Ambulatory Visit | Attending: Family Medicine | Admitting: Family Medicine

## 2020-07-03 ENCOUNTER — Ambulatory Visit (INDEPENDENT_AMBULATORY_CARE_PROVIDER_SITE_OTHER): Payer: Medicaid - Out of State | Admitting: Family Medicine

## 2020-07-03 ENCOUNTER — Other Ambulatory Visit: Payer: Self-pay

## 2020-07-03 VITALS — BP 117/78 | HR 85 | Wt 237.6 lb

## 2020-07-03 DIAGNOSIS — O0993 Supervision of high risk pregnancy, unspecified, third trimester: Secondary | ICD-10-CM | POA: Diagnosis present

## 2020-07-03 DIAGNOSIS — Z3A36 36 weeks gestation of pregnancy: Secondary | ICD-10-CM | POA: Insufficient documentation

## 2020-07-03 DIAGNOSIS — O24113 Pre-existing diabetes mellitus, type 2, in pregnancy, third trimester: Secondary | ICD-10-CM

## 2020-07-03 DIAGNOSIS — Z1389 Encounter for screening for other disorder: Secondary | ICD-10-CM

## 2020-07-03 LAB — POCT URINALYSIS DIPSTICK OB
Blood, UA: NEGATIVE
Glucose, UA: NEGATIVE
Ketones, UA: NEGATIVE
Leukocytes, UA: NEGATIVE
Nitrite, UA: NEGATIVE
POC,PROTEIN,UA: NEGATIVE

## 2020-07-03 NOTE — Progress Notes (Signed)
HIGH-RISK PREGNANCY VISIT Patient name: April [name] MRN [id number]  Date of birth: [birth year] Chief Complaint:   Routine Prenatal Visit, Non-stress Test, and High Risk Gestation  History of Present Illness:   April [name] is a 35 y.o. G45P1001 female at [redacted]w[redacted]d with an Estimated Date of Delivery: 07/31/20 being seen today for ongoing management of a high-risk pregnancy complicated by Class B DM(Type2).  Today she reports no complaints.  Sugars have been variable. Fastings 90-150 and postprandial 104-192. Reports nothing consistent and has tried to link to food. Reports stress and sleep are effecting her levels. She did not bring her log.  She currently takes glyburide 7.5mg  and NPH 20 units at night.   Depression screen [facility] 2/9 02/17/2020 01/09/2020  Decreased Interest 1 2  Down, Depressed, Hopeless 0 1  PHQ - 2 Score 1 3  Altered sleeping 2 3  Tired, decreased energy 1 3  Change in appetite 0 0  Feeling bad or failure about yourself  0 1  Trouble concentrating 0 1  Moving slowly or fidgety/restless 0 0  Suicidal thoughts 0 0  PHQ-9 Score 4 11    Contractions: Irritability. Vag. Bleeding: None.  Movement: Present. denies leaking of fluid.  Review of Systems:   Pertinent items are noted in HPI Denies abnormal vaginal discharge w/ itching/odor/irritation, headaches, visual changes, shortness of breath, chest pain, abdominal pain, severe nausea/vomiting, or problems with urination or bowel movements unless otherwise stated above. Pertinent History Reviewed:  Reviewed past medical,surgical, social, obstetrical and family history.  Reviewed problem list, medications and allergies. Physical Assessment:   Vitals:   07/03/20 1542  BP: 117/78  Pulse: 85  Weight: 237 lb 9.6 oz (107.8 kg)  Body mass index is 43.46 kg/m.           Physical Examination:   General appearance: alert, well appearing, and in no distress  Mental status: alert, oriented to person, place, and time  Skin:  warm & dry   Extremities: Edema: Trace    Cardiovascular: normal heart rate noted  Respiratory: normal respiratory effort, no distress  Abdomen: gravid, soft, non-tender  Pelvic: Cervical exam deferred vaginal mucosa normal appearing        Chaperone: Iris  Neas  Fetal Status:     Movement: Present    Fetal Surveillance Testing today:  April [name] is at [redacted]w[redacted]d Estimated Date of Delivery: 07/31/20  NST being performed due to Class B DM, type 2, poorly controlled  Today the NST is Reactive  Fetal Monitoring:  Baseline: 135 bpm, Variability: Good {> 6 bpm), Accelerations: Reactive and Decelerations: Absent   reactive The accelerations are >15 bpm and more than 2 in 20 minutes  Final diagnosis:  Reactive NST   No results found for this or any previous visit (from the past 24 hour(s)).  Assessment & Plan:  High-risk pregnancy: G2P1001 at [redacted]w[redacted]d  with an Estimated Date of Delivery: 07/31/20   1) Class B DM(type 2)  Changes today:  - NPH BID--> NPH 10units in AM, 22 units QHS  - Continue glyburide for now   I feel this glyburide might not be the best medication at this point. I discussed the risk of  hypoglycemia with both a sulfonaurea and insulin. Reviewed if she has BG<60 she should  stop the glyburide as I doubt this is given her much additional benefit.     She reports a history of "something on her liver" on a CT scan prior to pregnancy and for this  reason she was not put on metformin (again prior to this pregnancy). This was done at an  outside facility and I do not have access. She did think that she had the hospital send a copy of  the CT scan but I cannot find it in [place]   Recommend consideration of metformin to help with insulin sensitivity  2) h/o C-Section-- will have repeat CS with BTl at 39 weeks.   3) Macrosomia @32wk6d  EFW 99.8%  - repeat ordered today to check EFW and poly, scheduled for 1/25  4) Routine PNC - 36 wk labs today - Has scheduled RCS with BTL    Meds:  No orders of the defined types were placed in this encounter.   Labs/procedures today: GBS and GC/CT swabs  Treatment Plan:  Twice weekly testing, repeat C section, continue to work to improve BS control  Reviewed: Preterm labor symptoms and general obstetric precautions including but not limited to vaginal bleeding, contractions, leaking of fluid and fetal movement were reviewed in detail with the patient.  All questions were answered. Has home bp cuff. Rx faxed to . Check bp weekly, let 2/25 know if >140/90.   Follow-up: Return in about 1 week (around 07/10/2020) for Routine prenatal care, MD only.   Future Appointments  Date Time Provider Department Center  07/06/2020 11:10 AM [facility] NURSE [facility]  07/10/2020 12:00 PM [facility] 07/12/2020 [facility] None  07/10/2020  3:10 PM 07/12/2020, CNM [facility]  07/13/2020 11:30 AM [facility] NURSE [facility]  07/17/2020  3:10 PM 09/14/2020, MD [facility]  07/20/2020 11:30 AM [facility] NURSE [facility]  07/24/2020  3:10 PM 09/21/2020, CNM [facility]  07/27/2020 11:30 AM [facility] NURSE [facility]  07/31/2020  3:10 PM 08/02/2020, MD [facility]  08/03/2020 11:30 AM [facility] NURSE [facility]    Orders Placed This Encounter  Procedures  . Culture, beta strep (group b only)  . 08/05/2020 OB Follow Up  . POC Urinalysis Dipstick OB   [facility] 07/05/2020 12:46 PM

## 2020-07-05 ENCOUNTER — Telehealth: Payer: Self-pay | Admitting: General Practice

## 2020-07-05 LAB — CERVICOVAGINAL ANCILLARY ONLY
Chlamydia: NEGATIVE
Comment: NEGATIVE
Comment: NORMAL
Neisseria Gonorrhea: NEGATIVE

## 2020-07-05 NOTE — Telephone Encounter (Addendum)
Pt states she got her pfizer covid vaccine at her local [facility] and she has also had the booster!!

## 2020-07-06 ENCOUNTER — Other Ambulatory Visit: Payer: Self-pay

## 2020-07-06 ENCOUNTER — Ambulatory Visit (INDEPENDENT_AMBULATORY_CARE_PROVIDER_SITE_OTHER): Payer: Medicaid - Out of State | Admitting: *Deleted

## 2020-07-06 VITALS — BP 111/69 | HR 85 | Wt 241.5 lb

## 2020-07-06 DIAGNOSIS — Z302 Encounter for sterilization: Secondary | ICD-10-CM

## 2020-07-06 DIAGNOSIS — Z98891 History of uterine scar from previous surgery: Secondary | ICD-10-CM

## 2020-07-06 DIAGNOSIS — Z3A36 36 weeks gestation of pregnancy: Secondary | ICD-10-CM | POA: Diagnosis not present

## 2020-07-06 DIAGNOSIS — O24113 Pre-existing diabetes mellitus, type 2, in pregnancy, third trimester: Secondary | ICD-10-CM | POA: Diagnosis not present

## 2020-07-06 DIAGNOSIS — O0993 Supervision of high risk pregnancy, unspecified, third trimester: Secondary | ICD-10-CM | POA: Diagnosis not present

## 2020-07-06 DIAGNOSIS — Z789 Other specified health status: Secondary | ICD-10-CM

## 2020-07-06 LAB — POCT URINALYSIS DIPSTICK OB
Blood, UA: NEGATIVE
Glucose, UA: NEGATIVE
Ketones, UA: NEGATIVE
Nitrite, UA: NEGATIVE
POC,PROTEIN,UA: NEGATIVE

## 2020-07-06 NOTE — Progress Notes (Addendum)
   NURSE VISIT- NST  SUBJECTIVE:  April [name] is a 35 y.o. G63P1001 female at [redacted]w[redacted]d, here for a NST for pregnancy complicated by A2/BDM currently on Glyburide.  She reports active fetal movement, contractions: none, vaginal bleeding: none, membranes: intact.   OBJECTIVE:  BP 111/69   Pulse 85   Wt 241 lb 8 oz (109.5 kg)   LMP 09/03/2019 Comment: hx of irreg cycles  BMI 44.17 kg/m   Appears well, no apparent distress  Results for orders placed or performed in visit on 07/06/20 (from the past 24 hour(s))  POC Urinalysis Dipstick OB   Collection Time: 07/06/20 11:45 AM  Result Value Ref Range   Color, UA     Clarity, UA     Glucose, UA Negative Negative   Bilirubin, UA     Ketones, UA neg    Spec Grav, UA     Blood, UA neg    pH, UA     POC,PROTEIN,UA Negative Negative, Trace, Small (1+), Moderate (2+), Large (3+), 4+   Urobilinogen, UA     Nitrite, UA neg    Leukocytes, UA Trace (A) Negative   Appearance     Odor      NST: FHR baseline 145 bpm, Variability: moderate, Accelerations:present, Decelerations:  Absent= Cat 1/Reactive Toco: none   ASSESSMENT: G2P1001 at [redacted]w[redacted]d with A2/BDM currently on Glyburide NST reactive  PLAN: EFM strip reviewed by [name], CNM, [facility]   Recommendations: keep next appointment as scheduled    Jobe Marker  07/06/2020 12:45 PM   Chart reviewed for nurse visit. Agree with plan of care.  Cheral Marker, [place] 07/06/2020 1:41 PM

## 2020-07-07 LAB — SPECIMEN STATUS REPORT

## 2020-07-07 LAB — CULTURE, BETA STREP (GROUP B ONLY): Strep Gp B Culture: NEGATIVE

## 2020-07-10 ENCOUNTER — Other Ambulatory Visit: Payer: Self-pay

## 2020-07-10 ENCOUNTER — Other Ambulatory Visit: Payer: Medicaid - Out of State | Admitting: Women's Health

## 2020-07-10 ENCOUNTER — Other Ambulatory Visit: Payer: Self-pay | Admitting: Family Medicine

## 2020-07-10 ENCOUNTER — Ambulatory Visit (INDEPENDENT_AMBULATORY_CARE_PROVIDER_SITE_OTHER): Payer: Medicaid - Out of State

## 2020-07-10 DIAGNOSIS — O0993 Supervision of high risk pregnancy, unspecified, third trimester: Secondary | ICD-10-CM

## 2020-07-10 DIAGNOSIS — O24113 Pre-existing diabetes mellitus, type 2, in pregnancy, third trimester: Secondary | ICD-10-CM

## 2020-07-10 DIAGNOSIS — Z789 Other specified health status: Secondary | ICD-10-CM

## 2020-07-10 DIAGNOSIS — Z98891 History of uterine scar from previous surgery: Secondary | ICD-10-CM

## 2020-07-10 DIAGNOSIS — Z302 Encounter for sterilization: Secondary | ICD-10-CM

## 2020-07-10 NOTE — Progress Notes (Signed)
[place] 37 wks,cephalic,BPP 8/8,AFI 23 cm,fhr 140 bpm,posterior placenta gr 3,EFW 5006 G 99.9%

## 2020-07-13 ENCOUNTER — Ambulatory Visit (INDEPENDENT_AMBULATORY_CARE_PROVIDER_SITE_OTHER): Payer: PRIVATE HEALTH INSURANCE | Admitting: Obstetrics & Gynecology

## 2020-07-13 ENCOUNTER — Other Ambulatory Visit: Payer: Medicaid - Out of State

## 2020-07-13 ENCOUNTER — Encounter: Payer: Self-pay | Admitting: Obstetrics & Gynecology

## 2020-07-13 ENCOUNTER — Other Ambulatory Visit: Payer: Self-pay

## 2020-07-13 VITALS — BP 116/79 | HR 79 | Wt 242.0 lb

## 2020-07-13 DIAGNOSIS — O0993 Supervision of high risk pregnancy, unspecified, third trimester: Secondary | ICD-10-CM

## 2020-07-13 DIAGNOSIS — Z3A37 37 weeks gestation of pregnancy: Secondary | ICD-10-CM

## 2020-07-13 DIAGNOSIS — Z1389 Encounter for screening for other disorder: Secondary | ICD-10-CM

## 2020-07-13 LAB — POCT URINALYSIS DIPSTICK OB
Blood, UA: NEGATIVE
Glucose, UA: NEGATIVE
Ketones, UA: NEGATIVE
Leukocytes, UA: NEGATIVE
Nitrite, UA: NEGATIVE

## 2020-07-13 NOTE — Progress Notes (Signed)
HIGH-RISK PREGNANCY VISIT Patient name: April [name] MRN [id number]  Date of birth: 1986/05/29 Chief Complaint:   Routine Prenatal Visit, Non-stress Test, and High Risk Gestation  History of Present Illness:   April [name] is a 35 y.o. G64P1001 female at [redacted]w[redacted]d with an Estimated Date of Delivery: 07/31/20 being seen today for ongoing management of a high-risk pregnancy complicated by Class B DM, poorly controlled with Fetal macrosomia, also JW declines all blood products.  Today she reports no complaints.  Depression screen [facility] 2/9 02/17/2020 01/09/2020  Decreased Interest 1 2  Down, Depressed, Hopeless 0 1  PHQ - 2 Score 1 3  Altered sleeping 2 3  Tired, decreased energy 1 3  Change in appetite 0 0  Feeling bad or failure about yourself  0 1  Trouble concentrating 0 1  Moving slowly or fidgety/restless 0 0  Suicidal thoughts 0 0  PHQ-9 Score 4 11    Contractions: Irregular. Vag. Bleeding: None.  Movement: Present. denies leaking of fluid.  Review of Systems:   Pertinent items are noted in HPI Denies abnormal vaginal discharge w/ itching/odor/irritation, headaches, visual changes, shortness of breath, chest pain, abdominal pain, severe nausea/vomiting, or problems with urination or bowel movements unless otherwise stated above. Pertinent History Reviewed:  Reviewed past medical,surgical, social, obstetrical and family history.  Reviewed problem list, medications and allergies. Physical Assessment:   Vitals:   07/13/20 1152  BP: 116/79  Pulse: 79  Weight: 242 lb (109.8 kg)  Body mass index is 44.26 kg/m.           Physical Examination:   General appearance: alert, well appearing, and in no distress  Mental status: alert, oriented to person, place, and time  Skin: warm & dry   Extremities: Edema: Trace    Cardiovascular: normal heart rate noted  Respiratory: normal respiratory effort, no distress  Abdomen: gravid, soft, non-tender  Pelvic: Cervical exam deferred          Fetal Status: Fetal Heart Rate (bpm): 135 Fundal Height: 42 cm Movement: Present    Fetal Surveillance Testing today: Reactive NST   Chaperone: N/A    Results for orders placed or performed in visit on 07/13/20 (from the past 24 hour(s))  POC Urinalysis Dipstick OB   Collection Time: 07/13/20 11:51 AM  Result Value Ref Range   Color, UA     Clarity, UA     Glucose, UA Negative Negative   Bilirubin, UA     Ketones, UA neg    Spec Grav, UA     Blood, UA neg    pH, UA     POC,PROTEIN,UA Small (1+) Negative, Trace, Small (1+), Moderate (2+), Large (3+), 4+   Urobilinogen, UA     Nitrite, UA neg    Leukocytes, UA Negative Negative   Appearance     Odor      Assessment & Plan:  High-risk pregnancy: G2P1001 at [redacted]w[redacted]d with an Estimated Date of Delivery: 07/31/20   1) Class B DM on glyburide 7.5 mg BID and NPH 10 am, increase 28 pm, stable  2) previous C section, fetal macrosomia, desires tubal ligation any method,   Meds: No orders of the defined types were placed in this encounter.   Labs/procedures today: reactive NST  April [name] is at [redacted]w[redacted]d Estimated Date of Delivery: 07/31/20  NST being performed due to Class B DM  Today the NST is Reactive  Fetal Monitoring:  Baseline: 135 bpm, Variability: Good {> 6 bpm), Accelerations: Reactive  and Decelerations: Absent   reactive  The accelerations are >15 bpm and more than 2 in 20 minutes  Final diagnosis:  Reactive NST  Lazaro Arms, MD     Treatment Plan:  Repeat C section with bilateral ligation, any method  Reviewed: Term labor symptoms and general obstetric precautions including but not limited to vaginal bleeding, contractions, leaking of fluid and fetal movement were reviewed in detail with the patient.  All questions were answered. Has home bp cuff. Rx faxed to . Check bp weekly, let us know if >140/90.   Follow-up: Return in about 4 days (around 07/17/2020) for NST, Nurse only.   Future Appointments  Date  Time Provider Department Center  07/17/2020  3:10 PM Lazaro Arms, MD [facility]  07/20/2020 11:30 AM [facility] NURSE [facility]  07/24/2020  3:10 PM Cheral Marker, CNM [facility]  07/27/2020 11:30 AM [facility] NURSE [facility]  07/31/2020  3:10 PM Lazaro Arms, MD [facility]  08/03/2020 11:30 AM [facility] NURSE [facility]    Orders Placed This Encounter  Procedures  . POC Urinalysis Dipstick OB   Lazaro Arms  07/13/2020 12:55 PM

## 2020-07-17 ENCOUNTER — Encounter: Payer: Self-pay | Admitting: Obstetrics & Gynecology

## 2020-07-17 ENCOUNTER — Other Ambulatory Visit: Payer: Self-pay | Admitting: Obstetrics & Gynecology

## 2020-07-17 ENCOUNTER — Ambulatory Visit (INDEPENDENT_AMBULATORY_CARE_PROVIDER_SITE_OTHER): Payer: Medicaid - Out of State | Admitting: Obstetrics & Gynecology

## 2020-07-17 ENCOUNTER — Other Ambulatory Visit (INDEPENDENT_AMBULATORY_CARE_PROVIDER_SITE_OTHER): Payer: Medicaid - Out of State

## 2020-07-17 ENCOUNTER — Other Ambulatory Visit: Payer: Self-pay

## 2020-07-17 VITALS — BP 122/69 | HR 81 | Wt 244.0 lb

## 2020-07-17 DIAGNOSIS — O09893 Supervision of other high risk pregnancies, third trimester: Secondary | ICD-10-CM | POA: Diagnosis not present

## 2020-07-17 DIAGNOSIS — Z3A38 38 weeks gestation of pregnancy: Secondary | ICD-10-CM

## 2020-07-17 DIAGNOSIS — O24113 Pre-existing diabetes mellitus, type 2, in pregnancy, third trimester: Secondary | ICD-10-CM

## 2020-07-17 DIAGNOSIS — O0993 Supervision of high risk pregnancy, unspecified, third trimester: Secondary | ICD-10-CM

## 2020-07-17 DIAGNOSIS — Z98891 History of uterine scar from previous surgery: Secondary | ICD-10-CM

## 2020-07-17 DIAGNOSIS — O288 Other abnormal findings on antenatal screening of mother: Secondary | ICD-10-CM | POA: Diagnosis not present

## 2020-07-17 DIAGNOSIS — Z302 Encounter for sterilization: Secondary | ICD-10-CM

## 2020-07-17 DIAGNOSIS — Z789 Other specified health status: Secondary | ICD-10-CM

## 2020-07-17 LAB — POCT URINALYSIS DIPSTICK OB
Blood, UA: NEGATIVE
Glucose, UA: NEGATIVE
Ketones, UA: NEGATIVE
Leukocytes, UA: NEGATIVE
Nitrite, UA: NEGATIVE
POC,PROTEIN,UA: NEGATIVE

## 2020-07-17 NOTE — Progress Notes (Signed)
HIGH-RISK PREGNANCY VISIT Patient name: April [name] MRN [id number]  Date of birth: Jun 14, 1986 Chief Complaint:   Routine Prenatal Visit (NST)  History of Present Illness:   April [name] is a 35 y.o. G78P1001 female at [redacted]w[redacted]d with an Estimated Date of Delivery: 07/31/20 being seen today for ongoing management of a high-risk pregnancy complicated by diabetes mellitus A2 on novolin 28 NPH diabeta 7.5 BID.  Today she reports no complaints.  Depression screen [facility] 2/9 02/17/2020 01/09/2020  Decreased Interest 1 2  Down, Depressed, Hopeless 0 1  PHQ - 2 Score 1 3  Altered sleeping 2 3  Tired, decreased energy 1 3  Change in appetite 0 0  Feeling bad or failure about yourself  0 1  Trouble concentrating 0 1  Moving slowly or fidgety/restless 0 0  Suicidal thoughts 0 0  PHQ-9 Score 4 11    Contractions: Irregular. Vag. Bleeding: None.  Movement: Present. denies leaking of fluid.  Review of Systems:   Pertinent items are noted in HPI Denies abnormal vaginal discharge w/ itching/odor/irritation, headaches, visual changes, shortness of breath, chest pain, abdominal pain, severe nausea/vomiting, or problems with urination or bowel movements unless otherwise stated above. Pertinent History Reviewed:  Reviewed past medical,surgical, social, obstetrical and family history.  Reviewed problem list, medications and allergies. Physical Assessment:   Vitals:   07/17/20 1525  BP: 122/69  Pulse: 81  Weight: 244 lb (110.7 kg)  Body mass index is 44.63 kg/m.           Physical Examination:   General appearance: alert, well appearing, and in no distress  Mental status: alert, oriented to person, place, and time  Skin: warm & dry   Extremities: Edema: Mild pitting, slight indentation    Cardiovascular: normal heart rate noted  Respiratory: normal respiratory effort, no distress  Abdomen: gravid, soft, non-tender  Pelvic: Cervical exam deferred         Fetal Status:     Movement: Present     Fetal Surveillance Testing today: BPP 8/8   Chaperone: N/A    Results for orders placed or performed in visit on 07/17/20 (from the past 24 hour(s))  POC Urinalysis Dipstick OB   Collection Time: 07/17/20  3:39 PM  Result Value Ref Range   Color, UA     Clarity, UA     Glucose, UA Negative Negative   Bilirubin, UA     Ketones, UA neg    Spec Grav, UA     Blood, UA neg    pH, UA     POC,PROTEIN,UA Negative Negative, Trace, Small (1+), Moderate (2+), Large (3+), 4+   Urobilinogen, UA     Nitrite, UA neg    Leukocytes, UA Negative Negative   Appearance     Odor      Assessment & Plan:  High-risk pregnancy: G2P1001 at [redacted]w[redacted]d with an Estimated Date of Delivery: 07/31/20   1) Class B DM with fetal macrosomia,     Meds: No orders of the defined types were placed in this encounter.   Labs/procedures today: BPP 8/8  Treatment Plan:  NST Monday  Reviewed: Term labor symptoms and general obstetric precautions including but not limited to vaginal bleeding, contractions, leaking of fluid and fetal movement were reviewed in detail with the patient.  All questions were answered.  home bp cuff. Rx faxed to . Check bp weekly, let us know if >140/90.   Follow-up: No follow-ups on file.   Future Appointments  Date Time  Provider Department Center  07/20/2020 11:30 AM [facility] NURSE [facility]  07/24/2020  3:10 PM Cheral Marker, CNM [facility]  07/27/2020 11:30 AM [facility] NURSE [facility]  07/31/2020  3:10 PM Lazaro Arms, MD [facility]  08/03/2020 11:30 AM [facility] NURSE [facility]    Orders Placed This Encounter  Procedures  . POC Urinalysis Dipstick OB   Lazaro Arms  07/17/2020 5:15 PM

## 2020-07-17 NOTE — Progress Notes (Signed)
[place] 38 wks,cephalic,BPP 8/8,FHR 154 bpm,posterior placenta gr 3,AFI 27.7 cm

## 2020-07-19 ENCOUNTER — Encounter (HOSPITAL_COMMUNITY): Payer: Self-pay

## 2020-07-19 NOTE — Patient Instructions (Signed)
April [name]  07/19/2020   Your procedure is scheduled on:  07/26/2020  Arrive at 0530 at Entrance C on [address] at [facility]  and [address]. You are invited to use the FREE valet parking or use the Visitor's parking deck.  Pick up the phone at the desk and dial [phone].  Call this number if you have problems the morning of surgery: [phone]  Remember:   Do not eat food:(After Midnight) Desps de medianoche.  Do not drink clear liquids: (After Midnight) Desps de medianoche.  Take these medicines the morning of surgery with A SIP OF WATER:  Take 14 units of insulin at bedtime and no medications on the day of surgery   Do not wear jewelry, make-up or nail polish.  Do not wear lotions, powders, or perfumes. Do not wear deodorant.  Do not shave 48 hours prior to surgery.  Do not bring valuables to the hospital.  [facility] is not   responsible for any belongings or valuables brought to the hospital.  Contacts, dentures or bridgework may not be worn into surgery.  Leave suitcase in the car. After surgery it may be brought to your room.  For patients admitted to the hospital, checkout time is 11:00 AM the day of              discharge.      Please read over the following fact sheets that you were given:     Preparing for Surgery

## 2020-07-20 ENCOUNTER — Other Ambulatory Visit: Payer: Medicaid - Out of State

## 2020-07-22 ENCOUNTER — Inpatient Hospital Stay (HOSPITAL_COMMUNITY): Payer: Medicaid - Out of State | Admitting: Anesthesiology

## 2020-07-22 ENCOUNTER — Inpatient Hospital Stay (HOSPITAL_COMMUNITY)
Admission: AD | Admit: 2020-07-22 | Discharge: 2020-07-25 | DRG: 783 | Disposition: A | Discharge status: Home / Self Care | Payer: Medicaid - Out of State | Attending: Obstetrics and Gynecology | Admitting: Obstetrics and Gynecology

## 2020-07-22 ENCOUNTER — Encounter (HOSPITAL_COMMUNITY): Payer: Self-pay | Admitting: Obstetrics and Gynecology

## 2020-07-22 ENCOUNTER — Other Ambulatory Visit: Payer: Self-pay

## 2020-07-22 ENCOUNTER — Encounter (HOSPITAL_COMMUNITY)
Admission: AD | Disposition: A | Discharge status: Home / Self Care | Payer: Self-pay | Source: Home / Self Care | Attending: Obstetrics and Gynecology

## 2020-07-22 DIAGNOSIS — E119 Type 2 diabetes mellitus without complications: Secondary | ICD-10-CM | POA: Diagnosis present

## 2020-07-22 DIAGNOSIS — R160 Hepatomegaly, not elsewhere classified: Secondary | ICD-10-CM

## 2020-07-22 DIAGNOSIS — Z789 Other specified health status: Secondary | ICD-10-CM | POA: Diagnosis present

## 2020-07-22 DIAGNOSIS — O24113 Pre-existing diabetes mellitus, type 2, in pregnancy, third trimester: Secondary | ICD-10-CM

## 2020-07-22 DIAGNOSIS — O0990 Supervision of high risk pregnancy, unspecified, unspecified trimester: Secondary | ICD-10-CM

## 2020-07-22 DIAGNOSIS — O3660X0 Maternal care for excessive fetal growth, unspecified trimester, not applicable or unspecified: Secondary | ICD-10-CM

## 2020-07-22 DIAGNOSIS — O99214 Obesity complicating childbirth: Secondary | ICD-10-CM | POA: Diagnosis present

## 2020-07-22 DIAGNOSIS — O403XX0 Polyhydramnios, third trimester, not applicable or unspecified: Secondary | ICD-10-CM | POA: Diagnosis present

## 2020-07-22 DIAGNOSIS — Z9851 Tubal ligation status: Secondary | ICD-10-CM

## 2020-07-22 DIAGNOSIS — Z3A38 38 weeks gestation of pregnancy: Secondary | ICD-10-CM

## 2020-07-22 DIAGNOSIS — O2412 Pre-existing diabetes mellitus, type 2, in childbirth: Secondary | ICD-10-CM | POA: Diagnosis present

## 2020-07-22 DIAGNOSIS — O24119 Pre-existing diabetes mellitus, type 2, in pregnancy, unspecified trimester: Secondary | ICD-10-CM | POA: Diagnosis present

## 2020-07-22 DIAGNOSIS — Z794 Long term (current) use of insulin: Secondary | ICD-10-CM

## 2020-07-22 DIAGNOSIS — Z302 Encounter for sterilization: Secondary | ICD-10-CM

## 2020-07-22 DIAGNOSIS — Z20822 Contact with and (suspected) exposure to covid-19: Secondary | ICD-10-CM | POA: Diagnosis present

## 2020-07-22 DIAGNOSIS — O34211 Maternal care for low transverse scar from previous cesarean delivery: Secondary | ICD-10-CM

## 2020-07-22 DIAGNOSIS — O3663X0 Maternal care for excessive fetal growth, third trimester, not applicable or unspecified: Secondary | ICD-10-CM | POA: Diagnosis present

## 2020-07-22 DIAGNOSIS — O0993 Supervision of high risk pregnancy, unspecified, third trimester: Secondary | ICD-10-CM

## 2020-07-22 DIAGNOSIS — Z98891 History of uterine scar from previous surgery: Secondary | ICD-10-CM

## 2020-07-22 DIAGNOSIS — O409XX0 Polyhydramnios, unspecified trimester, not applicable or unspecified: Secondary | ICD-10-CM | POA: Diagnosis present

## 2020-07-22 LAB — URINALYSIS, ROUTINE W REFLEX MICROSCOPIC
Bacteria, UA: NONE SEEN
Bilirubin Urine: NEGATIVE
Glucose, UA: NEGATIVE mg/dL
Ketones, ur: NEGATIVE mg/dL
Leukocytes,Ua: NEGATIVE
Nitrite: NEGATIVE
Protein, ur: 30 mg/dL — AB
RBC / HPF: >50 RBC/hpf — ABNORMAL HIGH (ref 0–5)
Specific Gravity, Urine: 1.014 (ref 1.005–1.030)
pH: 6 (ref 5.0–8.0)

## 2020-07-22 LAB — CBC
HCT: 38.4 % (ref 36.0–46.0)
Hemoglobin: 13.8 g/dL (ref 12.0–15.0)
MCH: 32.5 pg (ref 26.0–34.0)
MCHC: 35.9 g/dL (ref 30.0–36.0)
MCV: 90.4 fL (ref 80.0–100.0)
Platelets: 178 10*3/uL (ref 150–400)
RBC: 4.25 MIL/uL (ref 3.87–5.11)
RDW: 13.1 % (ref 11.5–15.5)
WBC: 10.6 10*3/uL — ABNORMAL HIGH (ref 4.0–10.5)
nRBC: 0 % (ref 0.0–0.2)

## 2020-07-22 LAB — GLUCOSE, CAPILLARY: Glucose-Capillary: 146 mg/dL — ABNORMAL HIGH (ref 70–99)

## 2020-07-22 LAB — TYPE AND SCREEN
ABO/RH(D): O POS
Antibody Screen: NEGATIVE

## 2020-07-22 LAB — HEMOGLOBIN A1C
Hgb A1c MFr Bld: 8 % — ABNORMAL HIGH (ref 4.8–5.6)
Mean Plasma Glucose: 182.9 mg/dL

## 2020-07-22 LAB — SARS CORONAVIRUS 2 BY RT PCR (HOSPITAL ORDER, PERFORMED IN ~~LOC~~ HOSPITAL LAB): SARS Coronavirus 2: NEGATIVE

## 2020-07-22 LAB — NO BLOOD PRODUCTS

## 2020-07-22 SURGERY — Surgical Case
Anesthesia: Spinal | Laterality: Bilateral | Wound class: Clean Contaminated

## 2020-07-22 MED ORDER — DIBUCAINE (PERIANAL) 1 % EX OINT: 1.0000 "application " | TOPICAL_OINTMENT | CUTANEOUS | Status: DC | PRN

## 2020-07-22 MED ORDER — DIPHENHYDRAMINE HCL 25 MG PO CAPS: 25.0000 mg | ORAL_CAPSULE | Freq: Four times a day (QID) | ORAL | Status: DC | PRN

## 2020-07-22 MED ORDER — DIPHENHYDRAMINE HCL 50 MG/ML IJ SOLN
12.5000 mg | INTRAMUSCULAR | Status: DC | PRN
  Administered 2020-07-23: 12.5 mg via INTRAVENOUS
  Filled 2020-07-22: qty 1

## 2020-07-22 MED ORDER — DIPHENHYDRAMINE HCL 25 MG PO CAPS: 25.0000 mg | ORAL_CAPSULE | ORAL | Status: DC | PRN

## 2020-07-22 MED ORDER — SIMETHICONE 80 MG PO CHEW
80.0000 mg | CHEWABLE_TABLET | Freq: Three times a day (TID) | ORAL | Status: DC
  Administered 2020-07-23 – 2020-07-24 (×4): 80 mg via ORAL
  Filled 2020-07-22 (×6): qty 1

## 2020-07-22 MED ORDER — PRENATAL MULTIVITAMIN CH
1.0000 | ORAL_TABLET | Freq: Every day | ORAL | Status: DC
  Administered 2020-07-23 – 2020-07-25 (×3): 1 via ORAL
  Filled 2020-07-22 (×3): qty 1

## 2020-07-22 MED ORDER — MORPHINE SULFATE (PF) 0.5 MG/ML IJ SOLN
INTRAMUSCULAR | Status: AC
  Filled 2020-07-22: qty 10

## 2020-07-22 MED ORDER — KETOROLAC TROMETHAMINE 30 MG/ML IJ SOLN
30.0000 mg | Freq: Four times a day (QID) | INTRAMUSCULAR | Status: AC | PRN
  Administered 2020-07-22: 30 mg via INTRAVENOUS

## 2020-07-22 MED ORDER — ACETAMINOPHEN 500 MG PO TABS: 1000.0000 mg | ORAL_TABLET | Freq: Four times a day (QID) | ORAL | Status: DC

## 2020-07-22 MED ORDER — CEFAZOLIN SODIUM-DEXTROSE 2-4 GM/100ML-% IV SOLN
2.0000 g | INTRAVENOUS | Status: DC
  Filled 2020-07-22: qty 100

## 2020-07-22 MED ORDER — COCONUT OIL OIL
1.0000 "application " | TOPICAL_OIL | Status: DC | PRN
  Administered 2020-07-23: 1 via TOPICAL

## 2020-07-22 MED ORDER — DEXAMETHASONE SODIUM PHOSPHATE 4 MG/ML IJ SOLN
INTRAMUSCULAR | Status: AC
  Filled 2020-07-22: qty 2

## 2020-07-22 MED ORDER — NALOXONE HCL 0.4 MG/ML IJ SOLN: 0.4000 mg | INTRAMUSCULAR | Status: DC | PRN

## 2020-07-22 MED ORDER — NALBUPHINE HCL 10 MG/ML IJ SOLN
5.0000 mg | Freq: Once | INTRAMUSCULAR | Status: DC | PRN
Start: 2020-07-22 — End: 2020-07-25

## 2020-07-22 MED ORDER — FENTANYL CITRATE (PF) 100 MCG/2ML IJ SOLN
INTRAMUSCULAR | Status: DC | PRN
  Administered 2020-07-22: 15 ug via INTRATHECAL

## 2020-07-22 MED ORDER — SOD CITRATE-CITRIC ACID 500-334 MG/5ML PO SOLN
30.0000 mL | ORAL | Status: AC
  Administered 2020-07-22: 30 mL via ORAL
  Filled 2020-07-22: qty 15

## 2020-07-22 MED ORDER — PHENYLEPHRINE HCL-NACL 20-0.9 MG/250ML-% IV SOLN
INTRAVENOUS | Status: DC | PRN
  Administered 2020-07-22: 60 ug/min via INTRAVENOUS

## 2020-07-22 MED ORDER — MENTHOL 3 MG MT LOZG: 1.0000 | LOZENGE | OROMUCOSAL | Status: DC | PRN

## 2020-07-22 MED ORDER — KETOROLAC TROMETHAMINE 30 MG/ML IJ SOLN
INTRAMUSCULAR | Status: AC
  Filled 2020-07-22: qty 1

## 2020-07-22 MED ORDER — SCOPOLAMINE 1 MG/3DAYS TD PT72: 1.0000 | MEDICATED_PATCH | Freq: Once | TRANSDERMAL | Status: DC

## 2020-07-22 MED ORDER — SODIUM CHLORIDE 0.9% FLUSH: 3.0000 mL | INTRAVENOUS | Status: DC | PRN

## 2020-07-22 MED ORDER — CEFAZOLIN SODIUM-DEXTROSE 2-3 GM-%(50ML) IV SOLR
INTRAVENOUS | Status: DC | PRN
  Administered 2020-07-22: 2 g via INTRAVENOUS

## 2020-07-22 MED ORDER — SENNOSIDES-DOCUSATE SODIUM 8.6-50 MG PO TABS
2.0000 | ORAL_TABLET | Freq: Every day | ORAL | Status: DC
  Administered 2020-07-23 – 2020-07-24 (×2): 2 via ORAL
  Filled 2020-07-22 (×3): qty 2

## 2020-07-22 MED ORDER — SIMETHICONE 80 MG PO CHEW
80.0000 mg | CHEWABLE_TABLET | ORAL | Status: DC | PRN
  Administered 2020-07-23: 80 mg via ORAL

## 2020-07-22 MED ORDER — INSULIN ASPART 100 UNIT/ML ~~LOC~~ SOLN
0.0000 [IU] | Freq: Three times a day (TID) | SUBCUTANEOUS | Status: DC
  Administered 2020-07-23 (×2): 2 [IU] via SUBCUTANEOUS
  Administered 2020-07-24: 3 [IU] via SUBCUTANEOUS

## 2020-07-22 MED ORDER — ONDANSETRON HCL 4 MG/2ML IJ SOLN: 4.0000 mg | Freq: Three times a day (TID) | INTRAMUSCULAR | Status: DC | PRN

## 2020-07-22 MED ORDER — MORPHINE SULFATE (PF) 0.5 MG/ML IJ SOLN
INTRAMUSCULAR | Status: DC | PRN
  Administered 2020-07-22: .15 mg via INTRATHECAL

## 2020-07-22 MED ORDER — PHENYLEPHRINE 40 MCG/ML (10ML) SYRINGE FOR IV PUSH (FOR BLOOD PRESSURE SUPPORT)
PREFILLED_SYRINGE | INTRAVENOUS | Status: AC
  Filled 2020-07-22: qty 10

## 2020-07-22 MED ORDER — SODIUM CHLORIDE 0.9 % IR SOLN
Status: DC | PRN
  Administered 2020-07-22: 1

## 2020-07-22 MED ORDER — WITCH HAZEL-GLYCERIN EX PADS: 1.0000 "application " | MEDICATED_PAD | CUTANEOUS | Status: DC | PRN

## 2020-07-22 MED ORDER — DEXAMETHASONE SODIUM PHOSPHATE 10 MG/ML IJ SOLN
INTRAMUSCULAR | Status: AC
  Filled 2020-07-22: qty 1

## 2020-07-22 MED ORDER — PHENYLEPHRINE HCL-NACL 20-0.9 MG/250ML-% IV SOLN
INTRAVENOUS | Status: AC
  Filled 2020-07-22: qty 250

## 2020-07-22 MED ORDER — TETANUS-DIPHTH-ACELL PERTUSSIS 5-2.5-18.5 LF-MCG/0.5 IM SUSY: 0.5000 mL | PREFILLED_SYRINGE | Freq: Once | INTRAMUSCULAR | Status: DC

## 2020-07-22 MED ORDER — ALBUMIN HUMAN 5 % IV SOLN: INTRAVENOUS | Status: DC | PRN

## 2020-07-22 MED ORDER — BUPIVACAINE IN DEXTROSE 0.75-8.25 % IT SOLN
INTRATHECAL | Status: DC | PRN
  Administered 2020-07-22: 1.6 mL via INTRATHECAL

## 2020-07-22 MED ORDER — FENTANYL CITRATE (PF) 100 MCG/2ML IJ SOLN
25.0000 ug | INTRAMUSCULAR | Status: DC | PRN
  Administered 2020-07-22: 50 ug via INTRAVENOUS

## 2020-07-22 MED ORDER — OXYTOCIN-SODIUM CHLORIDE 30-0.9 UT/500ML-% IV SOLN
INTRAVENOUS | Status: AC
  Filled 2020-07-22: qty 500

## 2020-07-22 MED ORDER — DEXAMETHASONE SODIUM PHOSPHATE 4 MG/ML IJ SOLN
INTRAMUSCULAR | Status: DC | PRN
  Administered 2020-07-22: 5 mg via INTRAVENOUS

## 2020-07-22 MED ORDER — FENTANYL CITRATE (PF) 100 MCG/2ML IJ SOLN
INTRAMUSCULAR | Status: AC
  Filled 2020-07-22: qty 2

## 2020-07-22 MED ORDER — NALBUPHINE HCL 10 MG/ML IJ SOLN: 5.0000 mg | Freq: Once | INTRAMUSCULAR | Status: DC | PRN

## 2020-07-22 MED ORDER — IBUPROFEN 800 MG PO TABS
800.0000 mg | ORAL_TABLET | Freq: Four times a day (QID) | ORAL | Status: DC
  Administered 2020-07-24 – 2020-07-25 (×5): 800 mg via ORAL
  Filled 2020-07-22 (×4): qty 1

## 2020-07-22 MED ORDER — LACTATED RINGERS IV BOLUS
1000.0000 mL | Freq: Once | INTRAVENOUS | Status: AC
  Administered 2020-07-22: 1000 mL via INTRAVENOUS

## 2020-07-22 MED ORDER — OXYCODONE HCL 5 MG PO TABS
5.0000 mg | ORAL_TABLET | ORAL | Status: DC | PRN
  Administered 2020-07-23 (×2): 5 mg via ORAL
  Administered 2020-07-24 – 2020-07-25 (×4): 10 mg via ORAL
  Filled 2020-07-22: qty 1
  Filled 2020-07-22 (×4): qty 2
  Filled 2020-07-22: qty 1

## 2020-07-22 MED ORDER — ENOXAPARIN SODIUM 60 MG/0.6ML ~~LOC~~ SOLN
50.0000 mg | SUBCUTANEOUS | Status: DC
  Administered 2020-07-23 – 2020-07-25 (×3): 50 mg via SUBCUTANEOUS
  Filled 2020-07-22 (×3): qty 0.6

## 2020-07-22 MED ORDER — ACETAMINOPHEN 500 MG PO TABS
1000.0000 mg | ORAL_TABLET | Freq: Four times a day (QID) | ORAL | Status: DC
  Administered 2020-07-22 – 2020-07-25 (×10): 1000 mg via ORAL
  Filled 2020-07-22 (×11): qty 2

## 2020-07-22 MED ORDER — ONDANSETRON HCL 4 MG/2ML IJ SOLN
INTRAMUSCULAR | Status: AC
  Filled 2020-07-22: qty 2

## 2020-07-22 MED ORDER — TRANEXAMIC ACID-NACL 1000-0.7 MG/100ML-% IV SOLN
1000.0000 mg | INTRAVENOUS | Status: DC
  Filled 2020-07-22: qty 100

## 2020-07-22 MED ORDER — PHENYLEPHRINE HCL (PRESSORS) 10 MG/ML IV SOLN
INTRAVENOUS | Status: DC | PRN
  Administered 2020-07-22: 80 ug via INTRAVENOUS
  Administered 2020-07-22: 200 ug via INTRAVENOUS
  Administered 2020-07-22 (×5): 80 ug via INTRAVENOUS

## 2020-07-22 MED ORDER — LACTATED RINGERS IV SOLN: INTRAVENOUS | Status: DC

## 2020-07-22 MED ORDER — STERILE WATER FOR IRRIGATION IR SOLN
Status: DC | PRN
  Administered 2020-07-22: 1000 mL

## 2020-07-22 MED ORDER — OXYTOCIN-SODIUM CHLORIDE 30-0.9 UT/500ML-% IV SOLN
INTRAVENOUS | Status: DC | PRN
  Administered 2020-07-22: 300 mL via INTRAVENOUS

## 2020-07-22 MED ORDER — KETOROLAC TROMETHAMINE 30 MG/ML IJ SOLN: 30.0000 mg | Freq: Once | INTRAMUSCULAR | Status: DC | PRN

## 2020-07-22 MED ORDER — ACETAMINOPHEN 500 MG PO TABS
ORAL_TABLET | ORAL | Status: AC
  Filled 2020-07-22: qty 2

## 2020-07-22 MED ORDER — ALBUMIN HUMAN 5 % IV SOLN
INTRAVENOUS | Status: AC
  Filled 2020-07-22: qty 250

## 2020-07-22 MED ORDER — SCOPOLAMINE 1 MG/3DAYS TD PT72
MEDICATED_PATCH | TRANSDERMAL | Status: AC
  Filled 2020-07-22: qty 1

## 2020-07-22 MED ORDER — OXYTOCIN-SODIUM CHLORIDE 30-0.9 UT/500ML-% IV SOLN: 2.5000 [IU]/h | INTRAVENOUS | Status: AC

## 2020-07-22 MED ORDER — METOCLOPRAMIDE HCL 5 MG/ML IJ SOLN
INTRAMUSCULAR | Status: AC
  Filled 2020-07-22: qty 2

## 2020-07-22 MED ORDER — NALBUPHINE HCL 10 MG/ML IJ SOLN
5.0000 mg | INTRAMUSCULAR | Status: DC | PRN
Start: 2020-07-22 — End: 2020-07-25

## 2020-07-22 MED ORDER — TRANEXAMIC ACID-NACL 1000-0.7 MG/100ML-% IV SOLN
INTRAVENOUS | Status: DC | PRN
  Administered 2020-07-22: 1000 mg via INTRAVENOUS

## 2020-07-22 MED ORDER — ONDANSETRON HCL 4 MG/2ML IJ SOLN
INTRAMUSCULAR | Status: DC | PRN
  Administered 2020-07-22: 4 mg via INTRAVENOUS

## 2020-07-22 MED ORDER — NALOXONE HCL 4 MG/10ML IJ SOLN
1.0000 ug/kg/h | INTRAVENOUS | Status: DC | PRN
  Filled 2020-07-22: qty 5

## 2020-07-22 MED ORDER — NALBUPHINE HCL 10 MG/ML IJ SOLN: 5.0000 mg | INTRAMUSCULAR | Status: DC | PRN

## 2020-07-22 MED ORDER — LACTATED RINGERS IV SOLN: INTRAVENOUS | Status: DC | PRN

## 2020-07-22 MED ORDER — KETOROLAC TROMETHAMINE 30 MG/ML IJ SOLN: 30.0000 mg | Freq: Four times a day (QID) | INTRAMUSCULAR | Status: AC | PRN

## 2020-07-22 MED ORDER — KETOROLAC TROMETHAMINE 30 MG/ML IJ SOLN
30.0000 mg | Freq: Four times a day (QID) | INTRAMUSCULAR | Status: AC
  Administered 2020-07-23 (×4): 30 mg via INTRAVENOUS
  Filled 2020-07-22 (×4): qty 1

## 2020-07-22 MED ORDER — SCOPOLAMINE 1 MG/3DAYS TD PT72
MEDICATED_PATCH | TRANSDERMAL | Status: DC | PRN
  Administered 2020-07-22: 1 via TRANSDERMAL

## 2020-07-22 SURGICAL SUPPLY — 40 items
BENZOIN TINCTURE PRP APPL 2/3 (GAUZE/BANDAGES/DRESSINGS) ×2 IMPLANT
CLAMP CORD UMBIL (MISCELLANEOUS) IMPLANT
CLIP FILSHIE TUBAL LIGA STRL (Clip) ×4 IMPLANT
CLOSURE STERI STRIP 1/2 X4 (GAUZE/BANDAGES/DRESSINGS) ×2 IMPLANT
CLOTH BEACON ORANGE TIMEOUT ST (SAFETY) ×2 IMPLANT
DERMABOND ADVANCED (GAUZE/BANDAGES/DRESSINGS) ×2
DERMABOND ADVANCED .7 DNX12 (GAUZE/BANDAGES/DRESSINGS) ×2 IMPLANT
DRSG OPSITE POSTOP 4X10 (GAUZE/BANDAGES/DRESSINGS) ×2 IMPLANT
DRSG OPSITE POSTOP 4X12 (GAUZE/BANDAGES/DRESSINGS) ×2 IMPLANT
ELECT REM PT RETURN 9FT ADLT (ELECTROSURGICAL) ×2
ELECTRODE REM PT RTRN 9FT ADLT (ELECTROSURGICAL) ×1 IMPLANT
EXTRACTOR VACUUM BELL STYLE (SUCTIONS) IMPLANT
GLOVE BIOGEL PI IND STRL 7.0 (GLOVE) ×1 IMPLANT
GLOVE BIOGEL PI IND STRL 8 (GLOVE) ×1 IMPLANT
GLOVE BIOGEL PI INDICATOR 7.0 (GLOVE) ×1
GLOVE BIOGEL PI INDICATOR 8 (GLOVE) ×1
GLOVE ECLIPSE 8.0 STRL XLNG CF (GLOVE) ×2 IMPLANT
GOWN STRL REUS W/TWL LRG LVL3 (GOWN DISPOSABLE) ×4 IMPLANT
KIT ABG SYR 3ML LUER SLIP (SYRINGE) ×2 IMPLANT
NEEDLE HYPO 18GX1.5 BLUNT FILL (NEEDLE) ×2 IMPLANT
NEEDLE HYPO 22GX1.5 SAFETY (NEEDLE) ×2 IMPLANT
NEEDLE HYPO 25X5/8 SAFETYGLIDE (NEEDLE) ×2 IMPLANT
NS IRRIG 1000ML POUR BTL (IV SOLUTION) ×2 IMPLANT
PACK C SECTION WH (CUSTOM PROCEDURE TRAY) ×2 IMPLANT
PAD OB MATERNITY 4.3X12.25 (PERSONAL CARE ITEMS) ×2 IMPLANT
PENCIL SMOKE EVAC W/HOLSTER (ELECTROSURGICAL) ×2 IMPLANT
RTRCTR C-SECT PINK 25CM LRG (MISCELLANEOUS) IMPLANT
SUT CHROMIC 0 CT 1 (SUTURE) ×2 IMPLANT
SUT MNCRL 0 VIOLET CTX 36 (SUTURE) ×2 IMPLANT
SUT MONOCRYL 0 CTX 36 (SUTURE) ×2
SUT PLAIN 2 0 (SUTURE) ×2
SUT PLAIN 2 0 XLH (SUTURE) IMPLANT
SUT PLAIN ABS 2-0 CT1 27XMFL (SUTURE) ×2 IMPLANT
SUT VIC AB 0 CTX 36 (SUTURE) ×1
SUT VIC AB 0 CTX36XBRD ANBCTRL (SUTURE) ×1 IMPLANT
SUT VIC AB 4-0 KS 27 (SUTURE) IMPLANT
SYR 20CC LL (SYRINGE) ×4 IMPLANT
TOWEL OR 17X24 6PK STRL BLUE (TOWEL DISPOSABLE) ×2 IMPLANT
TRAY FOLEY W/BAG SLVR 14FR LF (SET/KITS/TRAYS/PACK) IMPLANT
WATER STERILE IRR 1000ML POUR (IV SOLUTION) ×2 IMPLANT

## 2020-07-22 NOTE — Discharge Summary (Signed)
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 yo G2P1001 at 382w5das admitted in Latent Labor on 07/22/2020. Patient was noted to have minimal vaginal bleeding with cervical dilation to 4cm on arrival to [facility]. The patient went for non-emergent cesarean section due to Elective Repeat Cesarean (h/o Cesarean x1) in the setting of latent labor. Delivery details as follows: Membrane Rupture Time/Date: 3:45 PM ,07/22/2020   Delivery Method:C-Section, Low Transverse  Details of operation can be found in separate operative note. Bilateral Patient had a postpartum course with continuing work on CBG control. Started on Metformin and resumed Glyburide. Her CBGs were less than 200.. Marland Kitchenhe is ambulating,tolerating a regular diet, passing flatus, and urinating well.  Patient is discharged home in stable condition 07/25/20.  Newborn Data: Birth date:07/22/2020  Birth time:3:46 PM  Gender:Female  Living status:Living  Apgars:1 ,1  Weight:5290 g   Magnesium Sulfate received: No BMZ received:  No Rhophylac:N/A MMR:N/A T-DaP:Given prenatally Flu: Yes Transfusion:No  Physical exam  Vitals:   07/25/20 0007 07/25/20 0009 07/25/20 0425 07/25/20 0816  BP: 110/63 110/63 117/70 121/68  Pulse: 72 69 70 76  Resp: _0 Temp: 98.3 F (36.8 C)  98.1 F (36.7 C) 98.2 F (36.8 C)  TempSrc: Oral  Oral Oral  SpO2: 100%  99% 99%  Weight:      Height:       General: alert, cooperative and no distress Lochia: appropriate Uterine Fundus: firm Incision: No significant erythema, Dressing is clean, dry, and intact DVT Evaluation: No evidence of DVT seen on physical exam. Labs: Lab Results  Component Value Date   WBC 10.5 07/23/2020   HGB 9.0 (L) 07/23/2020   HCT 26.0 (L) 07/23/2020   MCV 92.2 07/23/2020   PLT 150 07/23/2020   CMP Latest Ref Rng & Units 07/23/2020  Glucose 70 - 99 mg/dL 151(H)  BUN 6 - 20 mg/dL 10  Creatinine 0.44 - 1.00 mg/dL 0.74  Sodium 135 - 145 mmol/L 134(L)  Potassium 3.5 - 5.1 mmol/L 4.4  Chloride 98 - 111 mmol/L 106  CO2 22 - 32 mmol/L 22  Calcium 8.9 - 10.3 mg/dL 8.3(L)  Total Protein 6.5 - 8.1 g/dL 4.7(L)  Total Bilirubin 0.3 - 1.2 mg/dL 0.6  Alkaline Phos 38 - 126 U/L 122  AST 15 - 41 U/L 21  ALT 0 - 44 U/L 15   Edinburgh Score: No flowsheet data found.   After visit meds:  Allergies as of 07/25/2020  Reactions   Latex Rash      Medication List    STOP taking these medications   insulin NPH Human 100 UNIT/ML injection Commonly known as: NOVOLIN N     TAKE these medications   Accu-Chek Guide Me w/Device Kit 1 each by Does not apply route 4 (four) times daily.   Accu-Chek Guide test strip Generic drug: glucose blood Use as instructed to check blood sugar 4 times daily   Accu-Chek Softclix Lancets lancets Use as instructed to check blood sugar 4 times daily   aspirin 81 MG chewable tablet Chew 2 tablets (162 mg total) by mouth daily. What changed:   how much to take  when to take this   Blood Pressure Monitor  Misc For regular home bp monitoring during pregnancy   ferrous sulfate 325 (65 FE) MG tablet Take 1 tablet (325 mg total) by mouth every other day. Start taking on: July 27, 2020   glyBURIDE 5 MG tablet Commonly known as: DIABETA Take 1 tablet (5 mg total) by mouth 2 (two) times daily with a meal. What changed:   how much to take  how to take this  when to take this  additional instructions   metFORMIN 1000 MG tablet Commonly known as: GLUCOPHAGE Take 1 tablet (1,000 mg total) by mouth 2 (two) times daily with a meal.   oxyCODONE-acetaminophen 5-325 MG tablet Commonly known as: PERCOCET/ROXICET Take 1-2 tablets by mouth every 6 (six) hours as needed.   pantoprazole 20 MG tablet Commonly known as: Protonix Take 1 tablet (20 mg total) by mouth daily. What changed: when to take this   prenatal multivitamin Tabs tablet Take 1 tablet by mouth at bedtime.        Discharge home in stable condition Infant Feeding: No evidence of DVT seen on physical exam. Infant Disposition:NICU  Discharge instruction: per After Visit Summary and Postpartum booklet. Activity: Advance as tolerated. Pelvic rest for 6 weeks.  Diet: routine diet Future Appointments: Future Appointments  Date Time Provider [place]  07/30/2020 10:50 AM Florian Buff, MD [facility]  08/27/2020  1:50 PM Roma Schanz, CNM [facility]   Follow up Visit: Message sent to [facility] clinic on 07/22/20 to schedule PP appt.  Please schedule this patient for a In person postpartum visit in 6 weeks with the following provider: MD. Additional Postpartum F/U:Postpartum Depression checkup and Incision check 1 week  High risk pregnancy complicated by: Type 2 Diabetes (no meds prior to pregnancy; insulin+glyburide in pregnancy), now h/o Cesarean x2, polyhydramnios, LGA infant Delivery mode:  C-Section, Low Transverse  Anticipated Birth Control:  BTL done [facility]   07/25/2020 Donnamae Jude, MD

## 2020-07-22 NOTE — Discharge Instructions (Signed)

## 2020-07-22 NOTE — Transfer of Care (Signed)
Immediate Anesthesia Transfer of Care Note  Patient: April [name]  Procedure(s) Performed: CESAREAN SECTION WITH BILATERAL TUBAL LIGATION (Bilateral )  Patient Location: PACU  Anesthesia Type:Spinal  Level of Consciousness: awake and alert   Airway & Oxygen Therapy: Patient Spontanous Breathing  Post-op Assessment: Report given to RN  Post vital signs: Reviewed  Last Vitals:  Vitals Value Taken Time  BP 113/67 07/22/20 1635  Temp    Pulse 60 07/22/20 1641  Resp 12 07/22/20 1641  SpO2 100 % 07/22/20 1641  Vitals shown include unvalidated device data.  Last Pain:  Vitals:   07/22/20 1635  TempSrc:   PainSc: 0-No pain         Complications: No complications documented.

## 2020-07-22 NOTE — H&P (Signed)
OBSTETRIC ADMISSION HISTORY AND PHYSICAL  April [name] is a 35 y.o. female G2P1001 with IUP at [redacted]w[redacted]d by 9 week ultrasound presenting for elective repeat Cesarean in the setting of latent labor. She reports +FMs, No LOF, no VB, no blurry vision, headaches or peripheral edema, and RUQ pain.  She plans on breast feeding. She requests BTL for birth control.  She received her prenatal care at [facility].  Dating: By 9 week ultrasound --->  Estimated Date of Delivery: 07/31/20  Sono: $Remo'@[redacted]w[redacted]d'ZsTKB$ , CWD, normal anatomy, cephalic presentation, 1941D, 99.9% EFW  Prenatal History/Complications:  -Type 2 Diabetes (insulin in pregnancy) -h/o Cesarean x1 (arrest of dilation) -polyhydramnios -Declines blood products  Past Medical History: Past Medical History:  Diagnosis Date  . Diabetes mellitus without complication ([place])   . Gall stones    during 1st trimester of pregnancy  . Gestational diabetes     Past Surgical History: Past Surgical History:  Procedure Laterality Date  . CESAREAN SECTION N/A 05/14/2014   Procedure: CESAREAN SECTION;  Surgeon: Osborne Oman, MD;  Location: [facility];  Service: Obstetrics;  Laterality: N/A;    Obstetrical History: OB History    Gravida  2   Para  1   Term  1   Preterm      AB      Living  1     SAB      IAB      Ectopic      Multiple  0   Live Births  1           Social History Social History   Socioeconomic History  . Marital status: Married    Spouse name: Not on file  . Number of children: Not on file  . Years of education: Not on file  . Highest education level: Not on file  Occupational History  . Not on file  Tobacco Use  . Smoking status: Never Smoker  . Smokeless tobacco: Never Used  Vaping Use  . Vaping Use: Never used  Substance and Sexual Activity  . Alcohol use: No  . Drug use: No  . Sexual activity: Yes    Birth control/protection: None  Other Topics Concern  . Not on file  Social History Narrative   . Not on file   Social Determinants of Health   Financial Resource Strain: High Risk  . Difficulty of Paying Living Expenses: Hard  Food Insecurity: Food Insecurity Present  . Worried About Charity fundraiser in the Last Year: Sometimes true  . Ran Out of Food in the Last Year: Sometimes true  Transportation Needs: No Transportation Needs  . Lack of Transportation (Medical): No  . Lack of Transportation (Non-Medical): No  Physical Activity: Sufficiently Active  . Days of Exercise per Week: 5 days  . Minutes of Exercise per Session: 30 min  Stress: No Stress Concern Present  . Feeling of Stress : Only a little  Social Connections: Socially Integrated  . Frequency of Communication with Friends and Family: More than three times a week  . Frequency of Social Gatherings with Friends and Family: Three times a week  . Attends Religious Services: More than 4 times per year  . Active Member of Clubs or Organizations: Yes  . Attends Archivist Meetings: More than 4 times per year  . Marital Status: Married    Family History: Family History  Problem Relation Age of Onset  . Hypertension Mother   . Diabetes Mother   .  Diabetes Father   . Diabetes Paternal Uncle   . Hypertension Maternal Grandmother     Allergies: Allergies  Allergen Reactions  . Latex Rash    Medications Prior to Admission  Medication Sig Dispense Refill Last Dose  . aspirin 81 MG chewable tablet Chew 2 tablets (162 mg total) by mouth daily. (Patient taking differently: Chew 81 mg by mouth in the morning and at bedtime.) 60 tablet 8 Past Week at Unknown time  . glyBURIDE (DIABETA) 5 MG tablet Take 7.$RemoveBefor'5mg'WxvuwtMojaAF$  (1.5 tablets) by mouth in the morning and at night 90 tablet 3 07/21/2020 at Unknown time  . insulin NPH Human (NOVOLIN N) 100 UNIT/ML injection Inject 0.2 mLs (20 Units total) into the skin at bedtime. (Patient taking differently: Inject 10-28 Units into the skin at bedtime. 10 units in the morning & 28  units at night) 10 mL 3 07/21/2020 at Unknown time  . pantoprazole (PROTONIX) 20 MG tablet Take 1 tablet (20 mg total) by mouth daily. (Patient taking differently: Take 20 mg by mouth every evening.) 30 tablet 6 07/21/2020 at Unknown time  . Prenatal MV & Min w/FA-DHA (PRENATAL GUMMIES PO) Take 2 tablets by mouth daily.   07/21/2020 at Unknown time  . Accu-Chek Softclix Lancets lancets Use as instructed to check blood sugar 4 times daily 100 each 12   . acetaminophen (TYLENOL) 500 MG tablet Take 500 mg by mouth every 6 (six) hours as needed (pain).     . Blood Glucose Monitoring Suppl (ACCU-CHEK GUIDE ME) w/Device KIT 1 each by Does not apply route 4 (four) times daily. 1 kit 0   . Blood Pressure Monitor MISC For regular home bp monitoring during pregnancy 1 each 0   . glucose blood (ACCU-CHEK GUIDE) test strip Use as instructed to check blood sugar 4 times daily 50 each 12   . ULTICARE INSULIN SYRINGE 31G X 5/16" 0.3 ML MISC         Review of Systems   All systems reviewed and negative except as stated in HPI  Blood pressure 136/87, pulse 87, temperature 98.4 F (36.9 C), temperature source Oral, resp. rate 18, height $RemoveBe'5\' 2"'VJqfHQkOv$  (1.575 m), weight 108.4 kg, last menstrual period 09/03/2019, SpO2 96 %, currently breastfeeding. General appearance: alert, cooperative and appears stated age Lungs:  Normal WOB Heart: regular rate  Abdomen: soft, non-tender Extremities: no sign of DVT Presentation: cephalic Fetal monitoringBaseline: 140 bpm, Variability: Good {> 6 bpm), Accelerations: Absent and Decelerations: Absent Uterine activityFrequency: Every 2-4 minutes Dilation: 4 Effacement (%): 60 Station: -2 Exam by:: L Leftwich-Kirby, CNM  Prenatal labs: ABO, Rh: O/Positive/-- (07/26 1456) Antibody: Negative (12/27 1216) Rubella: 6.40 (07/26 1456) RPR: Non Reactive (12/27 1216)  HBsAg: Negative (07/26 1456)  HIV: Non Reactive (12/27 1216)  GBS: Negative/-- (01/18 0000)  A1c 8.3 on 06/11/20 Genetic  screening not performed Anatomy US: wnl  Prenatal Transfer Tool  Maternal Diabetes: Yes:  Diabetes Type:  Insulin/Medication controlled Genetic Screening: not performed Maternal Ultrasounds/Referrals: Normal except for EFW >99.9%, polyhydramnios Fetal Ultrasounds or other Referrals:  Referred to Materal Fetal Medicine , Fetal Echo wnl Maternal Substance Abuse:  No Significant Maternal Medications:  Meds include: Other: Insulin Significant Maternal Lab Results: Group B Strep negative  No results found for this or any previous visit (from the past 24 hour(s)).  Patient Active Problem List   Diagnosis Date Noted  . Supervision of high risk pregnancy, antepartum 07/22/2020  . Polyhydramnios 06/11/2020  . Macrosomia 06/11/2020  . Request for  sterilization 05/30/2020  . No blood products 05/02/2020  . 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 History of cesarean delivery 01/09/2020  . Supervision of high-risk pregnancy 01/09/2020  . Cholelithiasis 03/23/2014    Assessment/Plan:  April [name] is a 35 y.o. G2P1001 at [redacted]w[redacted]d here for elective repeat Cesarean in setting of latent labor.  #Desire for Elective Repeat Cesarean  H/o Cesarean x1  Anticipated LGA Infant: The risks of cesarean section were discussed with the patient including but were not limited to: bleeding which may require transfusion or reoperation; infection which may require antibiotics; injury to bowel, bladder, ureters or other surrounding organs; injury to the fetus; need for additional procedures including hysterectomy in the event of a life-threatening hemorrhage; placental abnormalities wth subsequent pregnancies, incisional problems, thromboembolic phenomenon and other postoperative/anesthesia complications.  Patient also desires permanent sterilization.  Other reversible forms of contraception were discussed with patient; she declines all other modalities. Risks of procedure discussed with patient  including but not limited to: risk of regret, permanence of method, bleeding, infection, injury to surrounding organs and need for additional procedures.  Failure risk of about 1% with increased risk of ectopic gestation if pregnancy occurs was also discussed with patient.  Also discussed possibility of post-tubal pain syndrome. The patient concurred with the proposed plan, giving informed written consent for the procedures.  Patient has been NPO since yesterday evening; she will remain NPO for procedure. Anesthesia and OR aware.  Preoperative prophylactic antibiotics and SCDs ordered on call to the OR.  To OR when ready.  #Pain: Per anesthesia #FWB: Category 1 strip #ID: GBS negative; plan for ancef 2g prior to OR #MOF: breast #MOC: desires BTL as noted above (consent signed 07/09/20) #Circ: n/a #Declines blood products: Will plan for TXA in OR. #T2DM  Anticipated LGA Infant: Diagnosed in 2017; diet-controlled prior to current pregnancy. NPH insulin 10u in AM & 28u qHS, glyburide 7.$RemoveBeforeDE'5mg'UtaJsJqmbhEMhgf$  BID in pregnancy.  Randa Ngo, MD  07/22/2020, 12:19 PM

## 2020-07-22 NOTE — Anesthesia Preprocedure Evaluation (Addendum)
Anesthesia Evaluation  Patient identified by MRN, date of birth, ID band Patient awake    Reviewed: Allergy & Precautions, NPO status , Patient's Chart, lab work & pertinent test results  Airway Mallampati: II  TM Distance: >3 FB Neck ROM: Full    Dental no notable dental hx.    Pulmonary neg pulmonary ROS,    Pulmonary exam normal breath sounds clear to auscultation       Cardiovascular negative cardio ROS Normal cardiovascular exam Rhythm:Regular Rate:Normal     Neuro/Psych negative neurological ROS  negative psych ROS   GI/Hepatic negative GI ROS, Neg liver ROS,   Endo/Other  diabetes, Type obesity (BMI 44)  Renal/GU negative Renal ROS  negative genitourinary   Musculoskeletal negative musculoskeletal ROS (+)   Abdominal   Peds  Hematology  (+) REFUSES BLOOD PRODUCTS (will accept albumin and cell saver),   Anesthesia Other Findings Repeat C/S  Reproductive/Obstetrics (+) Pregnancy                            Anesthesia Physical Anesthesia Plan  ASA: III  Anesthesia Plan: Spinal   Post-op Pain Management:    Induction:   PONV Risk Score and Plan: Treatment may vary due to age or medical condition  Airway Management Planned: Natural Airway  Additional Equipment:   Intra-op Plan:   Post-operative Plan:   Informed Consent: I have reviewed the patients History and Physical, chart, labs and discussed the procedure including the risks, benefits and alternatives for the proposed anesthesia with the patient or authorized representative who has indicated his/her understanding and acceptance.     Dental advisory given  Plan Discussed with: CRNA  Anesthesia Plan Comments:         Anesthesia Quick Evaluation

## 2020-07-22 NOTE — Anesthesia Procedure Notes (Signed)
Spinal  Patient location during procedure: OR Start time: 07/22/2020 3:11 PM End time: 07/22/2020 3:18 PM Staffing Performed: anesthesiologist  Anesthesiologist: Elmer Picker, MD Preanesthetic Checklist Completed: patient identified, IV checked, risks and benefits discussed, surgical consent, monitors and equipment checked, pre-op evaluation and timeout performed Spinal Block Patient position: sitting Prep: DuraPrep and site prepped and draped Patient monitoring: cardiac monitor, continuous pulse ox and blood pressure Approach: midline Location: L3-4 Injection technique: single-shot Needle Needle type: Pencan  Needle gauge: 24 G Needle length: 9 cm Assessment Sensory level: T6 Additional Notes Functioning IV was confirmed and monitors were applied. Sterile prep and drape, including hand hygiene and sterile gloves were used. The patient was positioned and the spine was prepped. The skin was anesthetized with lidocaine.  Free flow of clear CSF was obtained prior to injecting local anesthetic into the CSF.  The spinal needle aspirated freely following injection.  The needle was carefully withdrawn.  The patient tolerated the procedure well.

## 2020-07-22 NOTE — Progress Notes (Signed)
Chaplain responded to [place], going first to OR, then redirected to check on father first, as he was having a harder time.  Father (Ramie) appeared emotionally traumatized and in a daze at first.  Chaplain offered emotional support and water while father contacted family members.  His grandfather is a Warden/ranger. Chaplain discussed with unit secretaries that if the pastor presented clergy credentials, he could visit as clergy.  Pt's maternal grandmother will be additional visitor.  Chaplain checked in with OR RN about mother, who was tired and did not want a visit at that time.    Please contact as needed for additional support.  Theodoro Parma [phone]    07/22/20 1655  Clinical Encounter Type  Visited With Patient not available  Visit Type Other (Comment) (newborn code)  Referral From Other (Comment) (Medical team)  Consult/Referral To Chaplain  Stress Factors  Patient Stress Factors Loss of control;Health changes  Family Stress Factors Loss of control;Lack of knowledge

## 2020-07-22 NOTE — MAU Note (Signed)
Presents with ctxs and light VB.  Reports ctxs are every 7 minutes lasting 30 seconds.  Endorses +FM.  Denies LOF.

## 2020-07-22 NOTE — Op Note (Signed)
April [name]   PROCEDURE DATE: 07/22/2020  PREOPERATIVE DIAGNOSES: Intrauterine pregnancy at [redacted]w[redacted]d weeks gestation; Desire for Permanent Sterilization, Latent Labor  POSTOPERATIVE DIAGNOSES: The same  PROCEDURE: Repeat Low Transverse Cesarean Section with Bilateral Tubal Ligation with Filshie Clips  SURGEON:  Dr. Nettie Elm, MD  ASSISTANT:  Dr. Lynnda Shields, MD  ANESTHESIOLOGY TEAM: Anesthesiologist: Elmer Picker, MD CRNA: Angela Adam, CRNA  INDICATIONS: April [name] is a 35 y.o. G2P1001 at [redacted]w[redacted]d here for cesarean section secondary to the indications listed under preoperative diagnoses; please see preoperative note for further details.  The risks of surgery were discussed with the patient including but were not limited to: bleeding which may require transfusion or reoperation; infection which may require antibiotics; injury to bowel, bladder, ureters or other surrounding organs; injury to the fetus; need for additional procedures including hysterectomy in the event of a life-threatening hemorrhage; formation of adhesions; placental abnormalities wth subsequent pregnancies; incisional problems; thromboembolic phenomenon and other postoperative/anesthesia complications.  The patient concurred with the proposed plan, giving informed written consent for the procedure. Of note, pt did decline blood products.  FINDINGS:  Viable female infant in cephalic presentation.  Apgars 1, 1 and 5 at one, five and 10 minutes respectively.  Moderate meconium stained fluid.  Intact placenta, three vessel cord.  Normal uterus, fallopian tubes and ovaries bilaterally. Moderate filmy adhesions along left aspect of lower uterine segment, fallopian tube and adjacent omentum.  ANESTHESIA: Spinal INTRAVENOUS FLUIDS: 2650 ml   ESTIMATED BLOOD LOSS: 469 ml URINE OUTPUT:  100 ml SPECIMENS: Placenta sent to pathology COMPLICATIONS: None immediate, Infant to NICU  PROCEDURE IN DETAIL:  The patient  preoperatively received intravenous antibiotics (ancef 2g) and had sequential compression devices applied to her lower extremities. She was then taken to the operating room where spinal anesthesia was administered and was found to be adequate. She was then placed in a dorsal supine position with a leftward tilt, and prepped and draped in a sterile manner.  A foley catheter was placed into her bladder and attached to constant gravity.  After an adequate timeout was performed, a Pfannenstiel skin incision was made with scalpel along her preexisting scar and carried through to the underlying layer of fascia sharply and bluntly. The fascia was incised in the midline, and this incision was extended bilaterally using the Mayo scissors.  Kocher clamps were applied to the superior aspect of the fascial incision and the underlying rectus muscles were dissected off bluntly and sharply.  A similar process was carried out on the inferior aspect of the fascial incision. The rectus muscles were separated in the midline and the peritoneum was entered sharply and bluntly. The Alexis self-retaining retractor was introduced into the abdominal cavity.  Attention was turned to the lower uterine segment where a low transverse hysterotomy was made with a scalpel and extended bilaterally bluntly. The infant was successfully delivered, the cord was promptly clamped and cut given initial Apgar score, and the infant was handed over to the awaiting neonatology team. Arterial cord gas (pH <6.88) and cord blood were obtained. Uterine massage was then administered, and the placenta delivered intact with a three-vessel cord. The uterus was then cleared of clots and debris.  The uterus was exteriorized and the hysterotomy was closed with double layer of 0 Chromic in a running locked fashion. Moderate filmy adhesions along the left lower uterine segment, fallopian tube and adjacent omentum were disrupted sharply and bluntly to free the fallopian  tube.   The left  Fallopian tube was identified by tracing out to the fimbraie. An avascular midsection of the tube approximately 3-4cm from the cornua was grasped and the filshie clip was applied, taking care to incorporate the entire tube.  Attention was then turned to the right fallopian tube after confirmation by tracing the tube out to the fimbriae. The same procedure was then performed on the right Fallopian tube, with excellent hemostasis was noted from both BTL sites. The uterus was then returned to the abdomen.  The pelvis was irrigated and cleared of all clot and debris. Hemostasis was confirmed on all surfaces.  The retractor was removed.  The peritoneum was closed with a 0 Vicryl running stitch and the rectus muscles were reapproximated using 0 Vicryl interrupted stitches. The fascia was then closed using 0 Vicryl in a running fashion.  The subcutaneous layer was irrigated and reapproximated with 2-0 plain gut interrupted stitches, and the skin was closed with a 4-0 Vicryl subcuticular stitch. The patient tolerated the procedure well. Sponge, instrument and needle counts were correct x 3.  She was taken to the recovery room in stable condition.   Sheila Oats, MD OB Fellow, Faculty [facility] 07/22/2020 4:55 PM

## 2020-07-22 NOTE — MAU Provider Note (Signed)
  S: [name] is a 35 y.o. 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 She endorses vaginal bleeding. She denies LOF. She reports normal fetal movement.    O: BP 136/87   Pulse 87   Temp 98.4 F (36.9 C) (Oral)   Resp 18   Ht 5\' 2"  (1.575 m)   Wt 108.4 kg   LMP 09/03/2019 Comment: hx of irreg cycles  SpO2 96%   BMI 43.70 kg/m  GENERAL: Well-developed, well-nourished female in no acute distress.  HEAD: Normocephalic, atraumatic.  CHEST: Normal effort of breathing, regular heart rate ABDOMEN: Soft, nontender, gravid  Cervical exam:  Dilation: 4 Effacement (%): 60 Station: -2 Presentation: Vertex Exam by:: L Leftwich-Kirby, CNM   Fetal Monitoring: Baseline: 135 Variability: minimal with periods of moderate Accelerations: not present Decelerations: isolated late x 2, resolved  Contractions: Q 1-2 minutes, mild to moderate to palpation   A: SIUP at [redacted]w[redacted]d  Active labor  P: Hx Cesarean, desires repeat Admit to [facility] Dr [facility] and Dr Alysia Penna, CNM 07/22/2020 12:27 PM

## 2020-07-22 NOTE — Lactation Note (Signed)
This note was copied from a baby's chart. Lactation Consultation Note Baby is 6 hrs old. Baby in NICU in critical condition. NPO. Intubated.  Mom has DEBP set up in rm and has pumped once. Collected a few drops. Praised mom. Mom had just finished when LC entered rm. Hand expression demonstrated collected a couple of drops. LC took what mom pumped and LC hand expressed. Milk storage for NICU reviewed. NICU book given to mom as well as Lactation brochure.  Answered mom's questions. Mom has inverted nipples. You can feel the base of the nipple and are a little everted since just finished pumping. Mom has a 59 yr old that she attempted to BF but was unable to. Mom tried NS but mom didn't like it and baby didn't like it per mom. Mom stated the baby didn't like her BM that she gagged on it but liked the formula. Mom plans to strictly pump and bottle feed.  LC went to NICU to get baby's milk labels and gave RN colostrum in container.  Patient Name: April [name] Date: 07/22/2020 Reason for consult: Initial assessment;NICU baby;Maternal endocrine disorder Age:49 hours  Maternal Data Has patient been taught Hand Expression?: Yes Does the patient have breastfeeding experience prior to this delivery?: Yes How long did the patient breastfeed?: mom tried NS, and tried pumping and bottle feeding and baby didn't like it. so she formula fed.  Feeding    LATCH Score       Type of Nipple: Inverted  Comfort (Breast/Nipple): Soft / non-tender         Lactation Tools Discussed/Used Tools: Shells;Pump (mom has shells) Breast pump type: Double-Electric Breast Pump Pump Education: Setup, frequency, and cleaning;Milk Storage Reason for Pumping: inverted nipples. mom will exclusively pump and bottle feed. Pumping frequency: Q3 hrs Pumped volume: 0.5 mL  Interventions Interventions: DEBP;Breast massage;Expressed milk;Hand express;Pre-pump if needed;Breast compression;Shells  (mom has shells)  Discharge Pump: DEBP  Consult Status Consult Status: Follow-up Date: 07/23/20 Follow-up type: In-patient    Charyl Dancer 07/22/2020, 10:19 PM

## 2020-07-23 ENCOUNTER — Encounter (HOSPITAL_COMMUNITY): Payer: Self-pay | Admitting: Obstetrics & Gynecology

## 2020-07-23 ENCOUNTER — Other Ambulatory Visit: Payer: Medicaid - Out of State

## 2020-07-23 ENCOUNTER — Inpatient Hospital Stay (HOSPITAL_COMMUNITY): Payer: Medicaid - Out of State

## 2020-07-23 LAB — GLUCOSE, CAPILLARY
Glucose-Capillary: 123 mg/dL — ABNORMAL HIGH (ref 70–99)
Glucose-Capillary: 129 mg/dL — ABNORMAL HIGH (ref 70–99)
Glucose-Capillary: 288 mg/dL — ABNORMAL HIGH (ref 70–99)

## 2020-07-23 LAB — CBC
HCT: 26 % — ABNORMAL LOW (ref 36.0–46.0)
Hemoglobin: 9 g/dL — ABNORMAL LOW (ref 12.0–15.0)
MCH: 31.9 pg (ref 26.0–34.0)
MCHC: 34.6 g/dL (ref 30.0–36.0)
MCV: 92.2 fL (ref 80.0–100.0)
Platelets: 150 K/uL (ref 150–400)
RBC: 2.82 MIL/uL — ABNORMAL LOW (ref 3.87–5.11)
RDW: 13.1 % (ref 11.5–15.5)
WBC: 10.5 K/uL (ref 4.0–10.5)
nRBC: 0 % (ref 0.0–0.2)

## 2020-07-23 LAB — COMPREHENSIVE METABOLIC PANEL
ALT: 15 U/L (ref 0–44)
AST: 21 U/L (ref 15–41)
Albumin: 2 g/dL — ABNORMAL LOW (ref 3.5–5.0)
Alkaline Phosphatase: 122 U/L (ref 38–126)
Anion gap: 6 (ref 5–15)
BUN: 10 mg/dL (ref 6–20)
CO2: 22 mmol/L (ref 22–32)
Calcium: 8.3 mg/dL — ABNORMAL LOW (ref 8.9–10.3)
Chloride: 106 mmol/L (ref 98–111)
Creatinine, Ser: 0.74 mg/dL (ref 0.44–1.00)
GFR, Estimated: >60 mL/min (ref >60)
Glucose, Bld: 151 mg/dL — ABNORMAL HIGH (ref 70–99)
Potassium: 4.4 mmol/L (ref 3.5–5.1)
Sodium: 134 mmol/L — ABNORMAL LOW (ref 135–145)
Total Bilirubin: 0.6 mg/dL (ref 0.3–1.2)
Total Protein: 4.7 g/dL — ABNORMAL LOW (ref 6.5–8.1)

## 2020-07-23 LAB — RPR: RPR Ser Ql: NONREACTIVE

## 2020-07-23 MED ORDER — VITAMIN C 250 MG PO TABS
250.0000 mg | ORAL_TABLET | ORAL | Status: DC
  Administered 2020-07-23 – 2020-07-25 (×2): 250 mg via ORAL
  Filled 2020-07-23 (×2): qty 1

## 2020-07-23 MED ORDER — METFORMIN HCL 500 MG PO TABS
1000.0000 mg | ORAL_TABLET | Freq: Two times a day (BID) | ORAL | Status: DC
  Administered 2020-07-23 – 2020-07-25 (×4): 1000 mg via ORAL
  Filled 2020-07-23 (×4): qty 2

## 2020-07-23 MED ORDER — FERROUS SULFATE 325 (65 FE) MG PO TABS
325.0000 mg | ORAL_TABLET | ORAL | Status: DC
  Administered 2020-07-23 – 2020-07-25 (×3): 325 mg via ORAL
  Filled 2020-07-23 (×3): qty 1

## 2020-07-23 NOTE — Progress Notes (Signed)
Chaplain notified of code event over the weekend by [facility]. Chaplain also visited at baby Everly's bedside. MOB shared that she was not really aware yesterday during the baby's code, but that she knows her husband was really worried. Cheli shared about the various difficulties of her pregnancy including her inability to maintain her blood sugar at the levels she hoped for despite being very strict. She began to realize that stress was probably a large factor in her elevated blood sugar and she is attempting to keep herself calm, despite her worry. She is relieved that Patsey Berthold is stable and grateful that they've been having their daughter, Quitman Livings, spend time away from home, so she is doing well.  Please page as further needs arise.  Maryanna Shape. Carley Hammed, M.Div. [facility] Chaplain Pager [phone] Office [phone]

## 2020-07-23 NOTE — Progress Notes (Signed)
Subjective: Postpartum Day 1: Cesarean Delivery Patient reports incisional pain, tolerating PO and no problems voiding.    Objective: Vital signs in last 24 hours: Temp:  [97.9 F (36.6 C)-98.6 F (37 C)] 98.2 F (36.8 C) (02/07 1147) Pulse Rate:  [59-71] 71 (02/07 1147) Resp:  [11-18] 17 (02/07 1147) BP: (91-133)/(41-79) 111/72 (02/07 1147) SpO2:  [97 %-100 %] 99 % (02/07 1147)  Physical Exam:  General: alert, cooperative and appears stated age Lochia: appropriate Uterine Fundus: firm Incision: no significant drainage, no significant erythema DVT Evaluation: No evidence of DVT seen on physical exam.  Recent Labs    07/22/20 1215 07/23/20 0550  HGB 13.8 9.0*  HCT 38.4 26.0*   Lab Results  Component Value Date   GLUCAP 129 (H) 07/23/2020   GLUCAP 146 (H) 07/22/2020   GLUCAP 141 (H) 12/28/2019     Assessment/Plan: Status post Cesarean section. Doing well postoperatively.  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  Reva Bores 07/23/2020, 12:10 PM

## 2020-07-23 NOTE — Anesthesia Postprocedure Evaluation (Signed)
Anesthesia Post Note  Patient: April [name]  Procedure(s) Performed: CESAREAN SECTION WITH BILATERAL TUBAL LIGATION (Bilateral )     Patient location during evaluation: PACU Anesthesia Type: Spinal Level of consciousness: oriented and awake and alert Pain management: pain level controlled Vital Signs Assessment: post-procedure vital signs reviewed and stable Respiratory status: spontaneous breathing, respiratory function stable and patient connected to nasal cannula oxygen Cardiovascular status: blood pressure returned to baseline and stable Postop Assessment: no headache, no backache and no apparent nausea or vomiting Anesthetic complications: no   No complications documented.  Last Vitals:  Vitals:   07/23/20 0000 07/23/20 0040  BP:  109/65  Pulse:  60  Resp:  16  Temp:  36.7 C  SpO2: 99% 100%    Last Pain:  Vitals:   07/22/20 1946  TempSrc: Oral  PainSc: 7    Pain Goal:                   Aivan Fillingim L Darius Fillingim

## 2020-07-23 NOTE — Lactation Note (Signed)
This note was copied from a baby's chart. Lactation Consultation Note  Patient Name: April [name] [id number] Date: 07/23/2020 Reason for consult: Follow-up assessment Age:35 hours   LC Follow Up Visit:  Mother unavailable at this time; will follow up later today.   Maternal Data    Feeding    LATCH Score                    Lactation Tools Discussed/Used    Interventions    Discharge    Consult Status Consult Status: Follow-up Date: 07/23/20 Follow-up type: In-patient    [name] R Merve Hotard 07/23/2020, 11:08 AM

## 2020-07-23 NOTE — Lactation Note (Signed)
This note was copied from a baby's chart. Lactation Consultation Note  Patient Name: April [name] [id number] Date: 07/23/2020 Reason for consult: Follow-up assessment;NICU baby;Early term 37-38.6wks;Maternal endocrine disorder Age:35 hours   LC in to visit with P2 Mom of baby in the NICU.  Baby is an uncontrolled Type 2 GDM and infant weighed 11 lbs 10.6 oz at birth.  Baby is NPO on ventilator in NICU.  Neonatal Code at delivery.  Mom has been consistently pumping and collecting drops of colostrum to take to baby.  Mom has colostrum containers, storage bottles and breast milk labels.  Reviewed importance of disassembling pump parts, washing, rinsing and air drying in separate bin provided.    Mom states her breasts are feeling heavier and colostrum leaking.  Mom asked for coconut oil to lubricate flange.  RN requested to obtain this for Mom.  Parents aware of lactation support and encouraged to ask for [facility] prn   Lactation Tools Discussed/Used Tools: Pump;Shells;Flanges;Coconut oil Flange Size: 24 Breast pump type: Double-Electric Breast Pump  Interventions Interventions: Breast feeding basics reviewed;Education;Breast massage;Hand express;DEBP;Shells;Coconut oil   Consult Status Consult Status: Follow-up Date: 07/24/20 Follow-up type: In-patient    April [name] 07/23/2020, 1:41 PM

## 2020-07-24 ENCOUNTER — Other Ambulatory Visit: Payer: Medicaid - Out of State | Admitting: Women's Health

## 2020-07-24 ENCOUNTER — Other Ambulatory Visit (HOSPITAL_COMMUNITY): Payer: Medicaid - Out of State

## 2020-07-24 ENCOUNTER — Encounter (HOSPITAL_COMMUNITY)
Admission: RE | Admit: 2020-07-24 | Discharge: 2020-07-24 | Disposition: A | Discharge status: Home / Self Care | Payer: Medicaid - Out of State | Source: Ambulatory Visit | Attending: Obstetrics & Gynecology | Admitting: Obstetrics & Gynecology

## 2020-07-24 LAB — GLUCOSE, CAPILLARY
Glucose-Capillary: 129 mg/dL — ABNORMAL HIGH (ref 70–99)
Glucose-Capillary: 166 mg/dL — ABNORMAL HIGH (ref 70–99)
Glucose-Capillary: 140 mg/dL — ABNORMAL HIGH (ref 70–99)

## 2020-07-24 LAB — SURGICAL PATHOLOGY

## 2020-07-24 MED ORDER — GLYBURIDE 5 MG PO TABS
5.0000 mg | ORAL_TABLET | Freq: Two times a day (BID) | ORAL | Status: DC
  Administered 2020-07-24 – 2020-07-25 (×2): 5 mg via ORAL
  Filled 2020-07-24 (×4): qty 1
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
## 2020-07-24 NOTE — Lactation Note (Signed)
This note was copied from a baby's chart. Lactation Consultation Note  Patient Name: April [name] Date: 07/24/2020 Reason for consult: Follow-up assessment;NICU baby;Early term 37-38.6wks;Maternal endocrine disorder Age:35 hours  Visited with mom of 64 hours old ETI NICU female, she's a P2. She has been pumping consistently every 3 hours, and getting some colostrum, praised her for her efforts. She reported she pumped 7 ml at 1:50 pm and 5 ml at 4:50 pm. Explained to mom that there will always be differences regarding the time of the day and that milk supply tends to dwindle as the day goes by.  Reviewed pumping schedule, breastmilk storage guidelines and prevention/treatment for sore nipples, mom voiced some discomfort but breast tissue is intact, she also has coconut oil in her room, advised her to use it prior pumping. Mom requested mom colostrum bullets, LC brought them in the room.  Feeding plan:  1. Encouraged mom to continue pumping every 2-3 hours during the day without going more than 6 hours at night; at least 8 pumping sessions/24 hours 2. Hand expression and breast massage were also encouraged prior pumping  Family is bilingual, this consult was in Spanish due to parental request. FOB present and supportive. Parents reported all questions and concerns were answered, they're both aware of LC OP services and will call PRN.   Maternal Data    Feeding Mother's Current Feeding Choice: Breast Milk and Formula (due to NICU stay)  LATCH Score                    Lactation Tools Discussed/Used Tools: Pump Breast pump type: Double-Electric Breast Pump  Interventions Interventions: Breast feeding basics reviewed;DEBP;Coconut oil  Discharge Pump: DEBP  Consult Status Consult Status: Follow-up Date: 07/25/20 Follow-up type: In-patient    April [name] 07/24/2020, 6:52 PM

## 2020-07-24 NOTE — Progress Notes (Signed)
Subjective: Postpartum Day 2: Cesarean Delivery Patient reports incisional pain, tolerating PO and no problems voiding.    Objective: Vital signs in last 24 hours: Temp:  [97.7 F (36.5 C)-98.7 F (37.1 C)] 98 F (36.7 C) (02/08 0404) Pulse Rate:  [63-75] 73 (02/08 0404) Resp:  [16-17] 16 (02/08 0404) BP: (91-115)/(41-72) 111/61 (02/08 0404) SpO2:  [98 %-100 %] 100 % (02/08 0404)  Physical Exam:  General: alert, cooperative and no distress Lochia: appropriate Uterine Fundus: firm Incision: healing well, no significant drainage DVT Evaluation: No evidence of DVT seen on physical exam.  Recent Labs    07/22/20 1215 07/23/20 0550  HGB 13.8 9.0*  HCT 38.4 26.0*   CBG (last 3)  Recent Labs    07/23/20 0859 07/23/20 1402 07/23/20 2333  GLUCAP 129* 123* 288*    Assessment/Plan: Status post Cesarean section. Doing well postoperatively.  Continue current care. SS insulin is ordered  Scheryl Darter 07/24/2020, 7:28 AM

## 2020-07-25 LAB — GLUCOSE, CAPILLARY: Glucose-Capillary: 108 mg/dL — ABNORMAL HIGH (ref 70–99)

## 2020-07-25 MED ORDER — GLYBURIDE 5 MG PO TABS: 5.0000 mg | ORAL_TABLET | Freq: Two times a day (BID) | ORAL | 1 refills | Status: AC

## 2020-07-25 MED ORDER — METFORMIN HCL 1000 MG PO TABS: 1000.0000 mg | ORAL_TABLET | Freq: Two times a day (BID) | ORAL | 1 refills | Status: AC

## 2020-07-25 MED ORDER — OXYCODONE-ACETAMINOPHEN 5-325 MG PO TABS: 1.0000 | ORAL_TABLET | Freq: Four times a day (QID) | ORAL | 0 refills | Status: DC | PRN

## 2020-07-25 MED ORDER — FERROUS SULFATE 325 (65 FE) MG PO TABS: 325.0000 mg | ORAL_TABLET | ORAL | 1 refills | Status: AC

## 2020-07-25 NOTE — Progress Notes (Signed)
Discharge instructions and prescriptions given to pt. Discussed post c-section care, signs and symptoms to report to the MD, upcoming appointments, and meds. Pt verbalizes understanding and has no questions at this time. Pt discharged home from hospital in stable condition. 

## 2020-07-26 ENCOUNTER — Inpatient Hospital Stay (HOSPITAL_COMMUNITY)
Admission: RE | Admit: 2020-07-26 | Payer: Medicaid - Out of State | Source: Home / Self Care | Admitting: Obstetrics & Gynecology

## 2020-07-27 ENCOUNTER — Other Ambulatory Visit: Payer: Medicaid - Out of State

## 2020-07-30 ENCOUNTER — Encounter: Payer: Medicaid - Out of State | Admitting: Obstetrics & Gynecology

## 2020-07-31 ENCOUNTER — Other Ambulatory Visit: Payer: Medicaid - Out of State | Admitting: Obstetrics & Gynecology

## 2020-08-03 ENCOUNTER — Other Ambulatory Visit: Payer: Medicaid - Out of State

## 2020-08-05 ENCOUNTER — Ambulatory Visit: Payer: Self-pay

## 2020-08-05 NOTE — Lactation Note (Signed)
This note was copied from a baby's chart. Lactation Consultation Note  Patient Name: Girl Markeia Harkless [id number] Date: 08/05/2020 Reason for consult: Follow-up assessment;NICU baby;Early term 37-38.6wks;Infant weight loss Age:35 wk.o.  Visited with mom of 79 weeks old ETI NICU female; she requested LC assistance. Mom pumping when entered the room, notice that she was using her Medela flanges/parts from her personal Medela pump on the Symphony hospital grade pump. Advised mom not to mix and match the parts, even though they're from the same manufactured and redirected her to the [employer], since it's a manufacturer recommendation.  Mom's main concerns is about her supply. She voiced that her milk came in around day 5 and she's been pumping close to 2 oz, every time, combined per pumping session. She's pumping every 2.5 hours including nights for about 20-30 minutes at a time. She didn't report sleep deprivation or stress because this is the only thing she can do for her baby.  She did request to try a bigger size flange, she's currently using # 24, and they seem appropriate but she complained about too much rubbing, Flanges # 27 were given, by the time LC came back on the room with the new flanges, she was already done pumping, she got 54 ml on this pumping session, praised her for her efforts. Reviewed pumping schedule, benefits of breast massage, hydration, lactogenesis III and power pumping.  Feeding plan:  1. Encouraged mom to continue pumping every 2.5 hours, at least 8-10 pumping sessions in 24 hours. She'll try breast massage with her new hands-free pumping bra 2. She'll try power pumping with her first morning session, and then later in the evening 3. She'll try the # 27 flanges, and will report back to lactation if they worked better than the # 24  FOB present and supportive. Parents reported all questions and concerns were answered, they're both aware of LC OP services and will call  PRN.   Maternal Data    Feeding Mother's Current Feeding Choice: Breast Milk and Formula  LATCH Score                    Lactation Tools Discussed/Used Tools: Pump Flange Size: 24;27 Breast pump type: Double-Electric Breast Pump Pumping frequency: q 2.5-3 hours Pumped volume: 54 mL  Interventions Interventions: Breast feeding basics reviewed;Breast massage;Breast compression;DEBP;Expressed milk  Discharge Pump: DEBP  Consult Status Consult Status: Follow-up Date: 08/08/20 Follow-up type: In-patient    Jaymian Bogart Venetia Constable 08/05/2020, 3:37 PM

## 2020-08-07 ENCOUNTER — Other Ambulatory Visit: Payer: Self-pay

## 2020-08-07 ENCOUNTER — Ambulatory Visit (INDEPENDENT_AMBULATORY_CARE_PROVIDER_SITE_OTHER): Payer: PRIVATE HEALTH INSURANCE | Admitting: Family Medicine

## 2020-08-07 DIAGNOSIS — O9270 Unspecified disorders of lactation: Secondary | ICD-10-CM | POA: Diagnosis not present

## 2020-08-07 DIAGNOSIS — E119 Type 2 diabetes mellitus without complications: Secondary | ICD-10-CM | POA: Diagnosis not present

## 2020-08-07 MED ORDER — METOCLOPRAMIDE HCL 10 MG PO TABS: 10.0000 mg | ORAL_TABLET | Freq: Three times a day (TID) | ORAL | 2 refills | Status: AC

## 2020-08-07 NOTE — Progress Notes (Signed)
      Subjective:    April [name] is a 35 y.o. female who presents to the clinic status post  Repeat C-section on 07/23/2020. The patient is not having any pain.  Eating a regular diet without difficulty. Bowel movements are normal. No other significant postoperative concerns.  The following portions of the patient's history were reviewed and updated as appropriate: allergies, current medications, past family history, past medical history, past social history, past surgical history and problem list.   Review of Systems Pertinent items are noted in HPI.   Objective:   BP 117/79 (BP Location: Right Arm, Patient Position: Sitting, Cuff Size: Normal)   Pulse 94   Wt 217 lb 9.6 oz (98.7 kg)   Breastfeeding Yes   BMI 39.80 kg/m  Constitutional:  Well-developed, well-nourished female in no acute distress.   Skin: Skin is warm and dry, no rash noted, not diaphoretic,no erythema, no pallor.  Cardiovascular: Normal heart rate noted  Respiratory: Effort and breath sounds normal, no problems with respiration noted  Abdomen: Soft, bowel sounds active, non-tender, no abnormal masses  Incision: Healing well, no drainage, no erythema, no hernia, no seroma, no swelling, no dehiscence, incision well approximated  Pelvic:   Deferred   Assessment:   Doing well postoperatively.     Plan:   1. Continue any current medications. 2. Wound care discussed. 3. Activity restrictions: none 4. Anticipated return to work: not applicable. 5. Follow up as needed 6.  Routine preventative health maintenance measures emphasized.  1. Cesarean delivery delivered Wound healing appropriately, steri strips removed  2. Lactation disorder Patient reports trouble with supply Encouraged sleep to help support mood and BM production Reviewed regular draining of breast q 2-3 hours Discussed power pumping for 1 session per day, best if first pump of the AM Patient is starting feugreek Discussed Rx galactogogue--  Domperidone is not available easily and patient would like to try reglan. Counseled on side effects  - metoCLOPramide (REGLAN) 10 MG tablet; Take 1 tablet (10 mg total) by mouth 3 (three) times daily before meals.  Dispense: 90 tablet; Refill: 2  3. Type 2 diabetes mellitus without complication, without long-term current use of insulin (HCC) Not taking any of her medications current Strongly encouraged Metformin use as this increased insulin sensitivity which is helpful for milk production.  Patient will start metformin 500mg  BID then increase to 1000mg  BID  Return in about 3 weeks (around 08/28/2020) for Postpartum visit.  Future Appointments  Date Time Provider Department Center  08/27/2020  1:50 PM 08/30/2020, CNM [facility]     Please refer to After Visit Summary for other counseling recommendations.

## 2020-08-14 ENCOUNTER — Inpatient Hospital Stay (HOSPITAL_COMMUNITY)
Admission: AD | Admit: 2020-08-14 | Discharge: 2020-08-14 | Disposition: A | Discharge status: Home / Self Care | Payer: Medicaid - Out of State | Attending: Obstetrics and Gynecology | Admitting: Obstetrics and Gynecology

## 2020-08-14 ENCOUNTER — Encounter (HOSPITAL_COMMUNITY): Payer: Self-pay | Admitting: Obstetrics and Gynecology

## 2020-08-14 ENCOUNTER — Other Ambulatory Visit: Payer: Self-pay

## 2020-08-14 DIAGNOSIS — T383X6A Underdosing of insulin and oral hypoglycemic [antidiabetic] drugs, initial encounter: Secondary | ICD-10-CM | POA: Insufficient documentation

## 2020-08-14 DIAGNOSIS — O8609 Infection of obstetric surgical wound, other surgical site: Secondary | ICD-10-CM | POA: Diagnosis present

## 2020-08-14 DIAGNOSIS — Z91128 Patient's intentional underdosing of medication regimen for other reason: Secondary | ICD-10-CM | POA: Insufficient documentation

## 2020-08-14 DIAGNOSIS — E119 Type 2 diabetes mellitus without complications: Secondary | ICD-10-CM | POA: Diagnosis not present

## 2020-08-14 DIAGNOSIS — T8149XA Infection following a procedure, other surgical site, initial encounter: Secondary | ICD-10-CM

## 2020-08-14 DIAGNOSIS — O8601 Infection of obstetric surgical wound, superficial incisional site: Secondary | ICD-10-CM | POA: Diagnosis not present

## 2020-08-14 LAB — GLUCOSE, CAPILLARY: Glucose-Capillary: 288 mg/dL — ABNORMAL HIGH (ref 70–99)

## 2020-08-14 MED ORDER — CEPHALEXIN 500 MG PO CAPS: 500.0000 mg | ORAL_CAPSULE | Freq: Four times a day (QID) | ORAL | 0 refills | Status: AC

## 2020-08-14 NOTE — Discharge Instructions (Signed)
Wound Infection A wound infection happens when tiny organisms (microorganisms) start to grow in a wound. A wound infection is most often caused by bacteria. Infection can cause the wound to break open or worsen. Wound infection needs treatment. If a wound infection is left untreated, complications can occur. Untreated wound infections may lead to an infection in the bloodstream (septicemia) or a bone infection (osteomyelitis). What are the causes? This condition is most often caused by bacteria growing in a wound. Other microorganisms, like yeast and fungi, can also cause wound infections. What increases the risk? The following factors may make you more likely to develop this condition:  Having a weak body defense system (immune system).  Having diabetes.  Taking steroid medicines for a long time (chronic use).  Smoking.  Being an older person.  Being overweight.  Taking chemotherapy medicines. What are the signs or symptoms? Symptoms of this condition include:  Having more redness, swelling, or pain at the wound site.  Having more blood or fluid at the wound site.  A bad smell coming from a wound or bandage (dressing).  Having a fever.  Feeling tired or fatigued.  Having warmth at or around the wound.  Having pus at the wound site. How is this diagnosed? This condition is diagnosed with a medical history and physical exam. You may also have a wound culture or blood tests or both. How is this treated? This condition is usually treated with an antibiotic medicine.  The infection should improve 24-48 hours after you start antibiotics.  After 24-48 hours, redness around the wound should stop spreading, and the wound should be less painful. Follow these instructions at home: Medicines  Take or apply over-the-counter and prescription medicines only as told by your health care provider.  If you were prescribed an antibiotic medicine, take or apply it as told by your health  care provider. Do not stop using the antibiotic even if you start to feel better. Wound care  Clean the wound each day, or as told by your health care provider. ? Wash the wound with mild soap and water. ? Rinse the wound with water to remove all soap. ? Pat the wound dry with a clean towel. Do not rub it.  Follow instructions from your health care provider about how to take care of your wound. Make sure you: ? Wash your hands with soap and water before and after you change your dressing. If soap and water are not available, use hand sanitizer. ? Change your dressing as told by your health care provider. ? Leave stitches (sutures), skin glue, or adhesive strips in place if your wound has been closed. These skin closures may need to stay in place for 2 weeks or longer. If adhesive strip edges start to loosen and curl up, you may trim the loose edges. Do not remove adhesive strips completely unless your health care provider tells you to do that. Some wounds are left open to heal on their own.  Check your wound every day for signs of infection. Watch for: ? More redness, swelling, or pain. ? More fluid or blood. ? Warmth. ? Pus or a bad smell.   General instructions  Keep the dressing dry until your health care provider says it can be removed.  Do not take baths, swim, or use a hot tub until your health care provider approves. Ask your health care provider if you may take showers. You may only be allowed to take sponge baths.  Raise (  elevate) the injured area above the level of your heart while you are sitting or lying down.  Do not scratch or pick at the wound.  Keep all follow-up visits as told by your health care provider. This is important. Contact a health care provider if:  Your pain is not controlled with medicine.  You have more redness, swelling, or pain around your wound.  You have more fluid or blood coming from your wound.  Your wound feels warm to the touch.  You have  pus coming from your wound.  You continue to notice a bad smell coming from your wound or your dressing.  Your wound that was closed breaks open. Get help right away if:  You have a red streak going away from your wound.  You have a fever. Summary  A wound infection happens when tiny organisms (microorganisms) start to grow in a wound.  This condition is usually treated with an antibiotic medicine.  Follow instructions from your health care provider about how to take care of your wound.  Contact a health care provider if your wound infection does not begin to improve in 24-48 hours, or your symptoms worsen.  Keep all follow-up visits as told by your health care provider. This is important. This information is not intended to replace advice given to you by your health care provider. Make sure you discuss any questions you have with your health care provider. Document Revised: 01/12/2018 Document Reviewed: 01/12/2018 Elsevier Patient Education  2021 [employer]         Postpartum Care After Cesarean Delivery This sheet gives you information about how to care for yourself from the time you deliver your baby to up to 6-12 weeks after delivery (postpartum period). Your health care provider may also give you more specific instructions. If you have problems or questions, contact your health care provider. Follow these instructions at home: Medicines  Take over-the-counter and prescription medicines only as told by your health care provider.  If you were prescribed an antibiotic medicine, take it as told by your health care provider. Do not stop taking the antibiotic even if you start to feel better.  Ask your health care provider if the medicine prescribed to you: ? Requires you to avoid driving or using heavy machinery. ? Can cause constipation. You may need to take actions to prevent or treat constipation, such as:  Drink enough fluid to keep your urine pale yellow.  Take  over-the-counter or prescription medicines.  Eat foods that are high in fiber, such as beans, whole grains, and fresh fruits and vegetables.  Limit foods that are high in fat and processed sugars, such as fried or sweet foods. Activity  Gradually return to your normal activities as told by your health care provider.  Avoid activities that take a lot of effort and energy (are strenuous) until approved by your health care provider. Walking at a slow to moderate pace is usually safe. Ask your health care provider what activities are safe for you. ? Do not lift anything that is heavier than your baby or 10 lb (4.5 kg) as told by your health care provider. ? Do not vacuum, climb stairs, or drive a car for as long as told by your health care provider.  If possible, have someone help you at home until you are able to do your usual activities yourself.  Rest as much as possible. Try to rest or take naps while your baby is sleeping. Vaginal bleeding  It is normal to have vaginal bleeding (lochia) after delivery. Wear a sanitary pad to absorb vaginal bleeding and discharge. ? During the first week after delivery, the amount and appearance of lochia is often similar to a menstrual period. ? Over the next few weeks, it will gradually decrease to a dry, yellow-brown discharge. ? For most women, lochia stops completely by 4-6 weeks after delivery. Vaginal bleeding can vary from woman to woman.  Change your sanitary pads frequently. Watch for any changes in your flow, such as: ? A sudden increase in volume. ? A change in color. ? Large blood clots.  If you pass a blood clot, save it and call your health care provider to discuss. Do not flush blood clots down the toilet before you get instructions from your health care provider.  Do not use tampons or douches until your health care provider says this is safe.  If you are not breastfeeding, your period should return 6-8 weeks after delivery. If you  are breastfeeding, your period may return anytime between 8 weeks after delivery and the time that you stop breastfeeding. Perineal care  If your C-section (Cesarean section) was unplanned, and you were allowed to labor and push before delivery, you may have pain, swelling, and discomfort of the tissue between your vaginal opening and your anus (perineum). You may also have an incision in the tissue (episiotomy) or the tissue may have torn during delivery. Follow these instructions as told by your health care provider: ? Keep your perineum clean and dry as told by your health care provider. Use medicated pads and pain-relieving sprays and creams as directed. ? If you have an episiotomy or vaginal tear, check the area every day for signs of infection. Check for:  Redness, swelling, or pain.  Fluid or blood.  Warmth.  Pus or a bad smell. ? You may be given a squirt bottle to use instead of wiping to clean the perineum area after you go to the bathroom. As you start healing, you may use the squirt bottle before wiping yourself. Make sure to wipe gently. ? To relieve pain caused by an episiotomy, vaginal tear, or hemorrhoids, try taking a warm sitz bath 2-3 times a day. A sitz bath is a warm water bath that is taken while you are sitting down. The water should only come up to your hips and should cover your buttocks.   Breast care  Within the first few days after delivery, your breasts may feel heavy, full, and uncomfortable (breast engorgement). You may also have milk leaking from your breasts. Your health care provider can suggest ways to help relieve breast discomfort. Breast engorgement should go away within a few days.  If you are breastfeeding: ? Wear a bra that supports your breasts and fits you well. ? Keep your nipples clean and dry. Apply creams and ointments as told by your health care provider. ? You may need to use breast pads to absorb milk leakage. ? You may have uterine  contractions every time you breastfeed for several weeks after delivery. Uterine contractions help your uterus return to its normal size. ? If you have any problems with breastfeeding, work with your health care provider or a Advertising copywriter.  If you are not breastfeeding: ? Avoid touching your breasts as this can make your breasts produce more milk. ? Wear a well-fitting bra and use cold packs to help with swelling. ? Do not squeeze out (express) milk. This causes you to make  more milk. Intimacy and sexuality  Ask your health care provider when you can engage in sexual activity. This may depend on your: ? Risk of infection. ? Healing rate. ? Comfort and desire to engage in sexual activity.  You are able to get pregnant after delivery, even if you have not had your period. If desired, talk with your health care provider about methods of family planning or birth control (contraception). Lifestyle  Do not use any products that contain nicotine or tobacco, such as cigarettes, e-cigarettes, and chewing tobacco. If you need help quitting, ask your health care provider.  Do not drink alcohol, especially if you are breastfeeding. Eating and drinking  Drink enough fluid to keep your urine pale yellow.  Eat high-fiber foods every day. These may help prevent or relieve constipation. High-fiber foods include: ? Whole grain cereals and breads. ? Brown rice. ? Beans. ? Fresh fruits and vegetables.  Take your prenatal vitamins until your postpartum checkup or until your health care provider tells you it is okay to stop.   General instructions  Keep all follow-up visits for you and your baby as told by your health care provider. Most women visit their health care provider for a postpartum checkup within the first 3-6 weeks after delivery. Contact a health care provider if you:  Feel unable to cope with the changes that a new baby brings to your life, and these feelings do not go  away.  Feel unusually sad or worried.  Have breasts that are painful, hard, or turn red.  Have a fever.  Have trouble holding urine or keeping urine from leaking.  Have little or no interest in activities you used to enjoy.  Have not breastfed at all and you have not had a menstrual period for 12 weeks after delivery.  Have stopped breastfeeding and you have not had a menstrual period for 12 weeks after you stopped breastfeeding.  Have questions about caring for yourself or your baby.  Pass a blood clot from your vagina. Get help right away if you:  Have chest pain.  Have difficulty breathing.  Have sudden, severe leg pain.  Have severe pain or cramping in your abdomen.  Bleed from your vagina so much that you fill more than one sanitary pad in one hour. Bleeding should not be heavier than your heaviest period.  Develop a severe headache.  Faint.  Have blurred vision or spots in your vision.  Have a bad-smelling vaginal discharge.  Have thoughts about hurting yourself or your baby. If you ever feel like you may hurt yourself or others, or have thoughts about taking your own life, get help right away. You can go to your nearest emergency department or call:  Your local emergency services (911 in the [place]  A suicide crisis helpline, such as the National Suicide Prevention Lifeline at (253)013-6295. This is open 24 hours a day. Summary  The period of time from when you deliver your baby to up to 6-12 weeks after delivery is called the postpartum period.  Gradually return to your normal activities as told by your health care provider.  Keep all follow-up visits for you and your baby as told by your health care provider. This information is not intended to replace advice given to you by your health care provider. Make sure you discuss any questions you have with your health care provider. Document Revised: 01/20/2018 Document Reviewed: 01/20/2018 Elsevier Patient  Education  2021 [employer].

## 2020-08-14 NOTE — MAU Note (Signed)
PT SAYS SHE DEL BY C/S ON 07-22-20 -  BY DR TRAVIS . HAD PNC WITH [facility].     WAS D/C ON 2-9.   BABY IS IN NICU -  11LBS 11 OZ - PT IS A DIABETIC .  INCISION WAS OK LAST WEEK FOR CHECK-UP- REMOVED STERISTRIPS- RED AND DRAINING YELLOWISH DRAINAGE - NOTICE TODAY BC PT FELT BAD.  NO FEVER AT HOME . DID NOT CALL OFFICE.

## 2020-08-14 NOTE — MAU Provider Note (Signed)
History     CSN: [id number]  Arrival date and time: 08/14/20 1939   Event Date/Time   First Provider Initiated Contact with Patient 08/14/20 2009      Chief Complaint  Patient presents with  . Drainage from Incision   [name] is a 35 y.o. G2P2002 at ppartum s/p C/S on 07/22/2020 with well-appearing incision check on 08/07/2020 who presents to MAU for incision infection. Patient is unsure when the symptoms started, but reports she has been keeping the incision clean and dry and washing it with a wound cleanser. Patient reports her husband has been doing this for her, but he has not been feeling well recently so she has been needing to clean the wound herself, which she finds difficult. Patient endorses sharp, shooting pains at incision site as well as pus drainage. Patient denies fever. Patient also reports she has diabetes and is supposed to be taking Metformin, but has not had a chance to pick up the prescription yet. Patient reports she last ate at 730PM tonight.  Pt denies VB, vaginal discharge/odor/itching. Pt denies N/V, constipation, diarrhea, or urinary problems. Pt denies fever, chills, fatigue, sweating or changes in appetite. Pt denies SOB or chest pain. Pt denies dizziness, HA, light-headedness, weakness.  Allergies? NKDA Current medications/supplements? none Prenatal care provider? [facility], 08/27/2020   OB History    Gravida  2   Para  2   Term  2   Preterm      AB      Living  2     SAB      IAB      Ectopic      Multiple  0   Live Births  2           Past Medical History:  Diagnosis Date  . Diabetes mellitus without complication (HCC)   . Gall stones    during 1st trimester of pregnancy  . Gestational diabetes     Past Surgical History:  Procedure Laterality Date  . CESAREAN SECTION N/A 05/14/2014   Procedure: CESAREAN SECTION;  Surgeon: Tereso Newcomer, MD;  Location: [facility];  Service: Obstetrics;  Laterality: N/A;   . CESAREAN SECTION WITH BILATERAL TUBAL LIGATION Bilateral 07/22/2020   Procedure: CESAREAN SECTION WITH BILATERAL TUBAL LIGATION;  Surgeon: Lazaro Arms, MD;  Location: [facility];  Service: Obstetrics;  Laterality: Bilateral;    Family History  Problem Relation Age of Onset  . Hypertension Mother   . Diabetes Mother   . Diabetes Father   . Diabetes Paternal Uncle   . Hypertension Maternal Grandmother     Social History   Tobacco Use  . Smoking status: Never Smoker  . Smokeless tobacco: Never Used  Vaping Use  . Vaping Use: Never used  Substance Use Topics  . Alcohol use: No  . Drug use: No    Allergies:  Allergies  Allergen Reactions  . Latex Rash    Medications Prior to Admission  Medication Sig Dispense Refill Last Dose  . acetaminophen (TYLENOL) 650 MG CR tablet Take 650 mg by mouth every 8 (eight) hours as needed for pain.   08/14/2020 at 1915  . Accu-Chek Softclix Lancets lancets Use as instructed to check blood sugar 4 times daily (Patient not taking: Reported on 08/07/2020) 100 each 12   . aspirin 81 MG chewable tablet Chew 2 tablets (162 mg total) by mouth daily. (Patient not taking: Reported on 08/07/2020) 60 tablet 8   . Blood  Glucose Monitoring Suppl (ACCU-CHEK GUIDE ME) w/Device KIT 1 each by Does not apply route 4 (four) times daily. (Patient not taking: Reported on 08/07/2020) 1 kit 0   . Blood Pressure Monitor MISC For regular home bp monitoring during pregnancy (Patient not taking: Reported on 08/07/2020) 1 each 0   . ferrous sulfate 325 (65 FE) MG tablet Take 1 tablet (325 mg total) by mouth every other day. (Patient not taking: Reported on 08/07/2020) 30 tablet 1   . glucose blood (ACCU-CHEK GUIDE) test strip Use as instructed to check blood sugar 4 times daily (Patient not taking: Reported on 08/07/2020) 50 each 12   . glyBURIDE (DIABETA) 5 MG tablet Take 1 tablet (5 mg total) by mouth 2 (two) times daily with a meal. (Patient not taking: Reported on 08/07/2020)  90 tablet 1   . metFORMIN (GLUCOPHAGE) 1000 MG tablet Take 1 tablet (1,000 mg total) by mouth 2 (two) times daily with a meal. (Patient not taking: Reported on 08/07/2020) 90 tablet 1   . metoCLOPramide (REGLAN) 10 MG tablet Take 1 tablet (10 mg total) by mouth 3 (three) times daily before meals. 90 tablet 2   . pantoprazole (PROTONIX) 20 MG tablet Take 1 tablet (20 mg total) by mouth daily. (Patient not taking: Reported on 08/07/2020) 30 tablet 6   . Prenatal Vit-Fe Fumarate-FA (PRENATAL MULTIVITAMIN) TABS tablet Take 1 tablet by mouth at bedtime. (Patient not taking: Reported on 08/07/2020)       Review of Systems  Constitutional: Negative for chills, diaphoresis, fatigue and fever.  Eyes: Negative for visual disturbance.  Respiratory: Negative for shortness of breath.   Cardiovascular: Negative for chest pain.  Gastrointestinal: Positive for abdominal pain (at incision site). Negative for constipation, diarrhea, nausea and vomiting.  Genitourinary: Negative for dysuria, flank pain, frequency, pelvic pain, urgency, vaginal bleeding and vaginal discharge.  Neurological: Negative for dizziness, weakness, light-headedness and headaches.   Physical Exam   Blood pressure 118/67, pulse 90, temperature 98.6 F (37 C), temperature source Oral, resp. rate 20, height 5' (1.524 m), weight 98.8 kg, currently breastfeeding.  Patient Vitals for the past 24 hrs:  BP Temp Temp src Pulse Resp Height Weight  08/14/20 1953 118/67 98.6 F (37 C) Oral 90 20 5' (1.524 m) 98.8 kg   Physical Exam Vitals and nursing note reviewed.  Constitutional:      General: She is not in acute distress.    Appearance: Normal appearance. She is not ill-appearing, toxic-appearing or diaphoretic.  HENT:     Head: Normocephalic and atraumatic.  Abdominal:     Palpations: Abdomen is soft.     Comments: Spotty redness, no areas of induration, 3-4 small, pinpoint holes along incision leaking small amount of pus with streaks of  blood. No pain on palpation, no swelling, not warmer than other areas on body, slight odor, see photos for additional details.  Neurological:     Mental Status: She is alert and oriented to person, place, and time.  Psychiatric:        Mood and Affect: Mood normal.        Behavior: Behavior normal.        Thought Content: Thought content normal.        Judgment: Judgment normal.     Media Information         Document Information  Photos  C/S incision  08/14/2020 20:22  Attached To:  Hospital Encounter on 08/14/20   Source Information  Hanifa Antonetti, Gerrie Nordmann, NP  [facility] Maternity Assess    MAU Course  Procedures  MDM -C/S incision infection of unknown duration, without fever in MAU -CBG: 288 -consulted with Dr. Elly Modena, pt does not need treatment for DM at this time, will give Keflex $RemoveBef'500mg'NikyHwEJGm$  QID x7days and patient can keep ppartum appt as scheduled -pt discharged to home in stable condition  Orders Placed This Encounter  Procedures  . Glucose, capillary    Standing Status:   Standing    Number of Occurrences:   1  . Discharge patient    Order Specific Question:   Discharge disposition    Answer:   01-Home or Self Care [1]    Order Specific Question:   Discharge patient date    Answer:   08/14/2020   Meds ordered this encounter  Medications  . cephALEXin (KEFLEX) 500 MG capsule    Sig: Take 1 capsule (500 mg total) by mouth 4 (four) times daily for 7 days.    Dispense:  28 capsule    Refill:  0    Order Specific Question:   Supervising Provider    Answer:   Janyth Pupa [[id number]]   Assessment and Plan   1. Wound infection after surgery   2. Postpartum state    Allergies as of 08/14/2020      Reactions   Latex Rash      Medication List    STOP taking these medications   aspirin 81 MG chewable tablet     TAKE these medications   Accu-Chek Guide Me w/Device Kit 1 each by Does not apply route 4 (four) times daily.   Accu-Chek Guide test strip Generic  drug: glucose blood Use as instructed to check blood sugar 4 times daily   Accu-Chek Softclix Lancets lancets Use as instructed to check blood sugar 4 times daily   acetaminophen 650 MG CR tablet Commonly known as: TYLENOL Take 650 mg by mouth every 8 (eight) hours as needed for pain.   Blood Pressure Monitor Misc For regular home bp monitoring during pregnancy   cephALEXin 500 MG capsule Commonly known as: KEFLEX Take 1 capsule (500 mg total) by mouth 4 (four) times daily for 7 days.   ferrous sulfate 325 (65 FE) MG tablet Take 1 tablet (325 mg total) by mouth every other day.   glyBURIDE 5 MG tablet Commonly known as: DIABETA Take 1 tablet (5 mg total) by mouth 2 (two) times daily with a meal.   metFORMIN 1000 MG tablet Commonly known as: GLUCOPHAGE Take 1 tablet (1,000 mg total) by mouth 2 (two) times daily with a meal.   metoCLOPramide 10 MG tablet Commonly known as: REGLAN Take 1 tablet (10 mg total) by mouth 3 (three) times daily before meals.   pantoprazole 20 MG tablet Commonly known as: Protonix Take 1 tablet (20 mg total) by mouth daily.   prenatal multivitamin Tabs tablet Take 1 tablet by mouth at bedtime.      -pt advised to start Metformin as prescribed -RX Keflex per Dr. Elly Modena recommendation -return MAU precautions given -pt discharged to home in stable condition  Gerrie Nordmann Sasha Rueth 08/14/2020, 8:36 PM

## 2020-08-17 ENCOUNTER — Encounter: Payer: Self-pay | Admitting: *Deleted

## 2020-08-22 ENCOUNTER — Ambulatory Visit: Payer: Self-pay

## 2020-08-22 NOTE — Lactation Note (Signed)
This note was copied from a baby's chart. Lactation Consultation Note  Patient Name: April [name] [id number] Date: 08/22/2020   Age:35 wk.o.  Per fob, mom is pumping and saving milk at home. Per fob, she was instructed to temporarily discontinue offering her milk. LC will f/u when mom is present. No charge.   Elder Negus, MA IBCLC 08/22/2020, 11:33 AM

## 2020-08-24 ENCOUNTER — Ambulatory Visit: Payer: Self-pay

## 2020-08-24 NOTE — Lactation Note (Signed)
This note was copied from a baby's chart. Lactation Consultation Note  Patient Name: April [name] Today's Date: 08/24/2020    This LC has made multiple attempts to consult with April [name]. Per RN, April [name] is feeling unwell and has not visited the past few days. LC will continue to f/u.    Elder Negus, MA IBCLC 08/24/2020, 4:10 PM

## 2020-08-26 ENCOUNTER — Ambulatory Visit: Payer: Self-pay

## 2020-08-26 NOTE — Lactation Note (Signed)
This note was copied from a baby's chart. Lactation Consultation Note  Patient Name: April [name] Today's Date: 08/26/2020 Reason for consult: NICU baby;Follow-up assessment Age:35 wk.o.  Mother reports that baby bf for about 5 minutes today. She requests help in the coming week but is unsure when she will return to hospital. She will let her nurse know when she will be available this week. RN to notify [facility] for appointment. LC provided additional bottles at moms request and answered questions about maternal diet during lactation.  Feeding Mother's Current Feeding Choice: Breast Milk and Formula Nipple Type: Other (avent level 1)   Lactation Tools Discussed/Used Pumping frequency: 8x Pumped volume: 60 mL    Consult Status Consult Status: Follow-up Follow-up type: In-patient   Elder Negus, MA IBCLC 08/26/2020, 4:32 PM

## 2020-08-27 ENCOUNTER — Ambulatory Visit: Payer: Medicaid - Out of State | Admitting: Women's Health

## 2020-08-29 ENCOUNTER — Ambulatory Visit: Payer: Self-pay

## 2020-08-29 NOTE — Lactation Note (Signed)
This note was copied from a baby's chart. Lactation Consultation Note  Patient Name: April [name] Today's Date: 08/29/2020 Reason for consult: NICU baby;Mother's request;Follow-up assessment Age:35 wk.o.  Maternal Data  Mom with bifurcated nipple on R. Provided 98mm shield at her request. Baby with intermittent suckling and loss of suction. +milk visible in shield p short bf. Provided extra bottles at mom's request.   Feeding Mother's Current Feeding Choice: Breast Milk and Formula Nipple Type: Other (Avent Level 1)  LATCH Score Latch: Repeated attempts needed to sustain latch, nipple held in mouth throughout feeding, stimulation needed to elicit sucking reflex.  Audible Swallowing: A few with stimulation  Type of Nipple: Inverted  Comfort (Breast/Nipple): Soft / non-tender  Hold (Positioning): No assistance needed to correctly position infant at breast.  LATCH Score: 6   Lactation Tools Discussed/Used Tools: Nipple Shields Nipple shield size: 20 36mm shield will likely fit better but hospital is out of stock. Mom aware.  Consult Status Consult Status: Follow-up Follow-up type: In-patient  Elder Negus, MA IBCLC 08/29/2020, 5:14 PM

## 2020-09-07 ENCOUNTER — Ambulatory Visit: Payer: Self-pay

## 2020-09-07 NOTE — Lactation Note (Signed)
This note was copied from a baby's chart. Lactation Consultation Note  Patient Name: April [name] [id number] Date: 09/07/2020 Reason for consult: Follow-up assessment Age:35 wk.o.  LC Follow Up Visit:  Family will be discharged home this evening.  Visited family to be sure mother had no further questions/concerns regarding pumping.  She had no questions and has been producing approximately 1 oz/session.  Discussed ways to help increase supply at home.  Mother stated she is exhausted and when she gets home she will feel more comfortable.  Reassurance provided.  Mother has OP appointments established and has finished meeting with the representative who provided the teaching of the feeding pump.  Mother has also been in close contact with the [facility] department in her hometown and they have been helpful.  She will be under their support and guidance with breast feeding and questions after discharge.  Parents happy to be discharged soon.  Baby is finishing her car seat test at this time.  RN updated.   Maternal Data    Feeding Nipple Type: Other (Avent Level 1)  LATCH Score                    Lactation Tools Discussed/Used    Interventions    Discharge    Consult Status Consult Status: Complete Date: 09/07/20 Follow-up type: Call as needed    April [name] 09/07/2020, 5:18 PM

## 2020-09-13 ENCOUNTER — Ambulatory Visit (INDEPENDENT_AMBULATORY_CARE_PROVIDER_SITE_OTHER): Payer: PRIVATE HEALTH INSURANCE | Admitting: Women's Health

## 2020-09-13 ENCOUNTER — Other Ambulatory Visit: Payer: Self-pay

## 2020-09-13 ENCOUNTER — Encounter: Payer: Self-pay | Admitting: Women's Health

## 2020-09-13 NOTE — Progress Notes (Signed)
POSTPARTUM VISIT Patient name: April [name] MRN [id number]  Date of birth: [birth year] Chief Complaint:   Follow-up  History of Present Illness:   April [name] is a 35 y.o. G40P2002 Hispanic female being seen today for a postpartum visit. She is 7 weeks postpartum following a repeat cesarean section, low transverse incision w/ BTL at 38.5 gestational weeks. IOL: No, for n/a. Anesthesia: spinal.  Laceration: n/a.  Complications: none. Inpatient contraception: yes BTL.   Pregnancy complicated by uncontrolled T2DM. Tobacco use: no. Substance use disorder: no. Last pap smear: unsure and results were negative per pt report at [facility]. Next pap smear due: now- refuses today, needs to get back home today, promises to schedule pap visit No LMP recorded (lmp unknown).  Postpartum course has been complicated by incisional infection, rx'd keflex at beginning of March. Bleeding no bleeding. Bowel function is normal. Bladder function is normal. Urinary incontinence? No, fecal incontinence? No  Desired contraception: BTL done PP. Patient does not want a pregnancy in the future.  Desired family size is 2 children.   Upstream - 09/13/20 1417      Pregnancy Intention Screening   Does the patient want to become pregnant in the next year? No    Does the patient's partner want to become pregnant in the next year? No    Would the patient like to discuss contraceptive options today? No      Contraception Wrap Up   Current Method Female Sterilization    End Method Female Sterilization    Contraception Counseling Provided No          The pregnancy intention screening data noted above was reviewed. Potential methods of contraception were discussed. The patient elected to proceed with Female Sterilization.   Edinburgh Postpartum Depression Screening: Positive baby was in NICU until last week, noticed depression about 2wks before she came home, but is getting much better now that she is home. Denies  SI/HI/II  Edinburgh Postnatal Depression Scale - 09/13/20 1417      Edinburgh Postnatal Depression Scale:  In the Past 7 Days   I have been able to laugh and see the funny side of things. 1    I have looked forward with enjoyment to things. 2    I have blamed myself unnecessarily when things went wrong. 2    I have been anxious or worried for no good reason. 2    I have felt scared or panicky for no good reason. 0    Things have been getting on top of me. 2    I have been so unhappy that I have had difficulty sleeping. 1    I have felt sad or miserable. 2    I have been so unhappy that I have been crying. 2    The thought of harming myself has occurred to me. 0    Edinburgh Postnatal Depression Scale Total 14          Baby's course has been complicated by NICU stay- just came home last week w/ G-tube. Baby is feeding by breast and bottle: milk supply inadequate . Infant has a pediatrician/family doctor? Yes.  Childcare strategy if returning to work/school: n/a.  Pt has material needs met for her and baby: Yes.   Review of Systems:   Pertinent items are noted in HPI Denies Abnormal vaginal discharge w/ itching/odor/irritation, headaches, visual changes, shortness of breath, chest pain, abdominal pain, severe nausea/vomiting, or problems with urination or bowel movements. Pertinent History Reviewed:  Reviewed past medical,surgical, obstetrical and family history.  Reviewed problem list, medications and allergies. OB History  Gravida Para Term Preterm AB Living  _0 SAB IAB Ectopic Multiple Live Births        0 2    # Outcome Date GA Lbr Len/2nd Weight Sex Delivery Anes PTL Lv  2 Term 07/22/20 [redacted]w[redacted]d 11 lb 10.6 oz (5.29 kg) F CS-LTranv Spinal  LIV  1 Term 05/14/14 [id number] lb 10 oz (3.912 kg) F CS-LTranv EPI N LIV     Complications: Failure to Progress in First Stage, Gestational diabetes   Physical Assessment:   Vitals:   09/13/20 1419  BP: 111/71  Pulse: 85  Weight:  216 lb 9.6 oz (98.2 kg)  Height: 5' 2" (1.575 m)  Body mass index is 39.62 kg/m.       Physical Examination:   General appearance: alert, well appearing, and in no distress  Mental status: alert, oriented to person, place, and time  Skin: warm & dry   Cardiovascular: normal heart rate noted   Respiratory: normal respiratory effort, no distress   Breasts: deferred, no complaints   Abdomen: soft, non-tender, c/s incision well-healed  Pelvic: examination not indicated  Rectal: not examined  Extremities: no edema  Chaperone: N/A         No results found for this or any previous visit (from the past 24 hour(s)).  Assessment & Plan:  1) Postpartum exam 2) 6 wks s/p repeat cesarean section, low transverse incision 3) breast & bottle feeding 4) Depression screening 5) Contraception> s/p BTL 6) T2DM> on metformin and glyburide, does not currently have PCP but is establishing care w/ [facility] 7) Past due for pap>didn't want to do during pregnancy, refused today, promises to schedule to come back  Essential components of care per ACOG recommendations:  1.  Mood and well being:  . If positive depression screen, discussed and plan developed.  . If using tobacco we discussed reduction/cessation and risk of relapse . If current substance abuse, we discussed and referral to local resources was offered.   2. Infant care and feeding:  . If breastfeeding, discussed returning to work, pumping, breastfeeding-associated pain, guidance regarding return to fertility while lactating if not using another method. If needed, patient was provided with a letter to be allowed to pump q 2-3hrs to support lactation in a private location with access to a refrigerator to store breastmilk.   . Recommended that all caregivers be immunized for flu, pertussis and other preventable communicable diseases . If pt does not have material needs met for her/baby, referred to local resources for help obtaining these.  3.  Sexuality, contraception and birth spacing . Provided guidance regarding sexuality, management of dyspareunia, and resumption of intercourse . Discussed avoiding interpregnancy interval <82m64m and recommended birth spacing of 18 months  4. Sleep and fatigue . Discussed coping options for fatigue and sleep disruption . Encouraged family/partner/community support of 4 hrs of uninterrupted sleep to help with mood and fatigue  5. Physical recovery  . If pt had a C/S, assessed incisional pain and providing guidance on normal vs prolonged recovery . If pt had a laceration, perineal healing and pain reviewed.  . If urinary or fecal incontinence, discussed management and referred to PT or uro/gyn if indicated  . Patient is safe to resume physical activity. Discussed attainment of healthy weight.  6.  Chronic disease management . Discussed pregnancy complications if  any, and their implications for future childbearing and long-term maternal health. . Review recommendations for prevention of recurrent pregnancy complications, such as 17 hydroxyprogesterone caproate to reduce risk for recurrent PTB not applicable, or aspirin to reduce risk of preeclampsia not applicable. . Pt had GDM: No. If yes, 2hr GTT scheduled: not applicable. . Reviewed medications and non-pregnant dosing including consideration of whether pt is breastfeeding using a reliable resource such as LactMed: yes . Referred for f/u w/ PCP or subspecialist providers as indicated: pt establishing care w/ PCP  7. Health maintenance . Mammogram at 35yo or earlier if indicated . Pap smears as indicated  Meds: No orders of the defined types were placed in this encounter.   Follow-up: Return for 1st available, Pap & physical.   No orders of the defined types were placed in this encounter.   Winger, [facility] 09/13/2020 2:44 PM

## 2020-10-16 ENCOUNTER — Other Ambulatory Visit: Payer: Medicaid - Out of State | Admitting: Women's Health

## 2021-07-28 IMAGING — US US OB COMP LESS 14 WK
1 series · 15 of 28 positions shown · non-contrast
Comparison: None.

CLINICAL DATA: Pregnant, abdominal pain

EXAM:
OBSTETRIC <14 WK US AND TRANSVAGINAL OB US
TECHNIQUE: Both transabdominal and transvaginal ultrasound examinations were
performed for complete evaluation of the gestation as well as the
maternal uterus, adnexal regions, and pelvic cul-de-sac.
Transvaginal technique was performed to assess early pregnancy.

[Series 1: us ob comp less 14 wk · 15 of 56 slices shown]
[im 1/56]
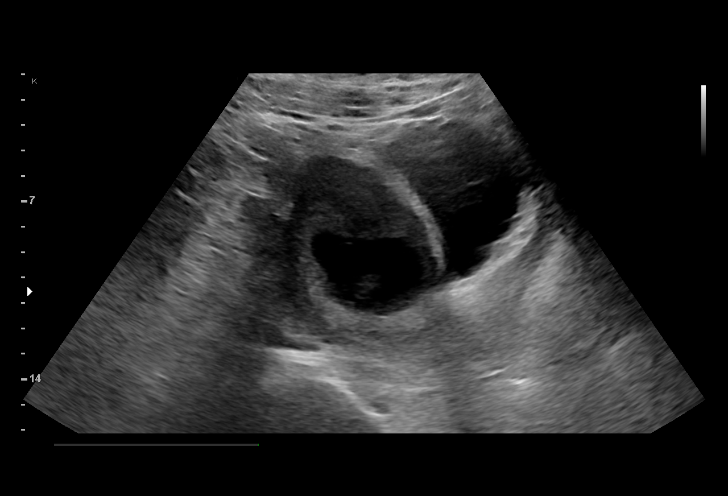
[im 5/56]
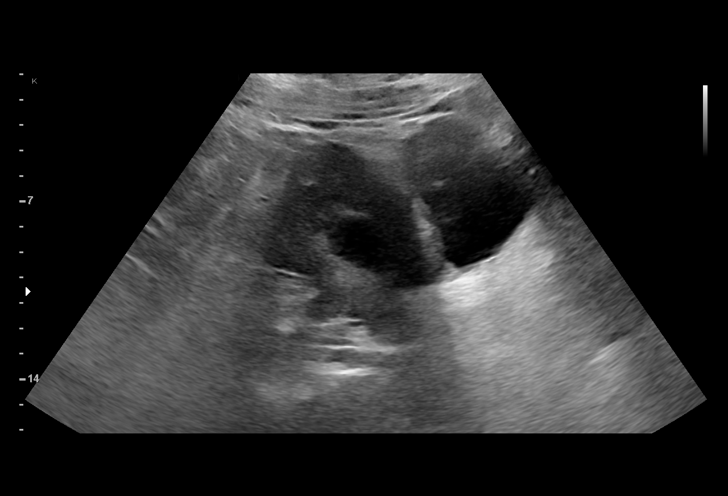
[im 9/56]
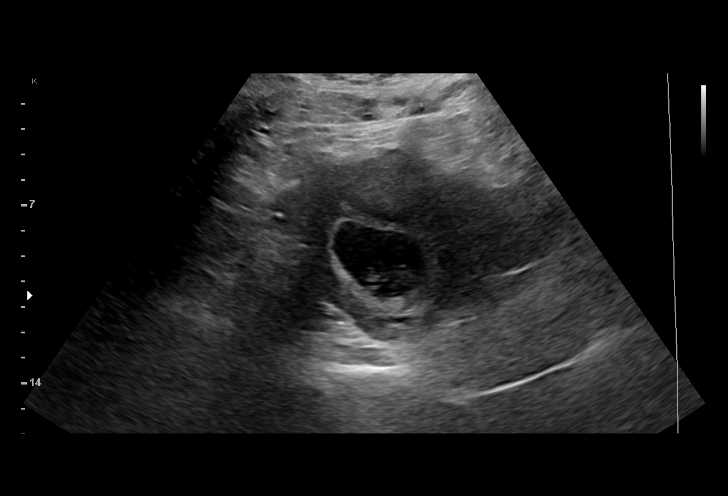
[im 13/56]
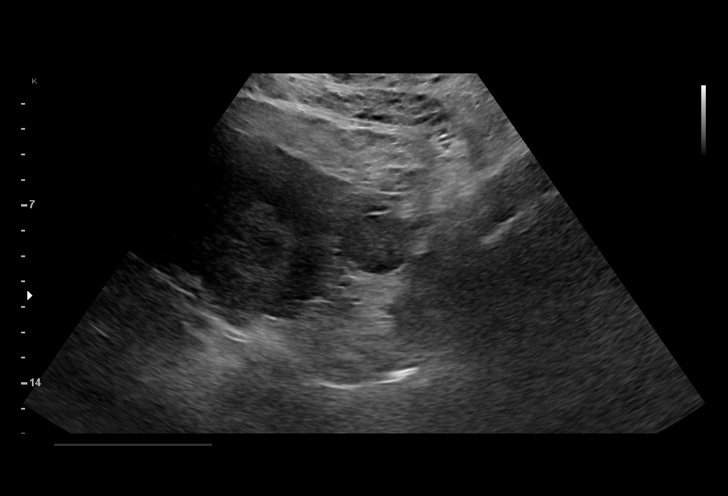
[im 17/56]
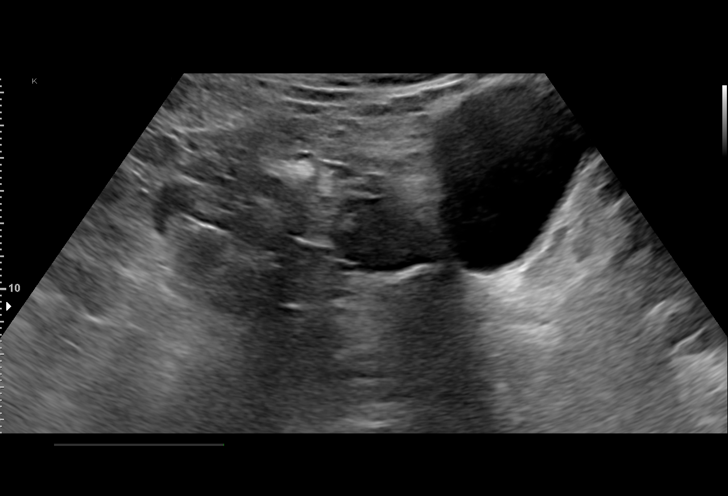
[im 21/56]
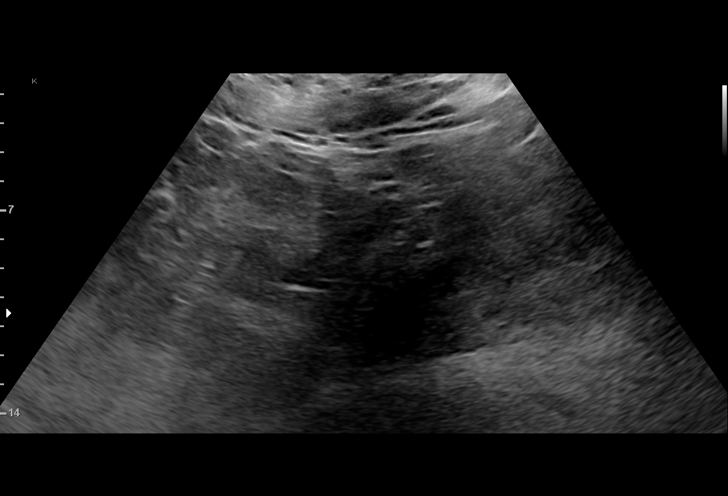
[im 25/56]
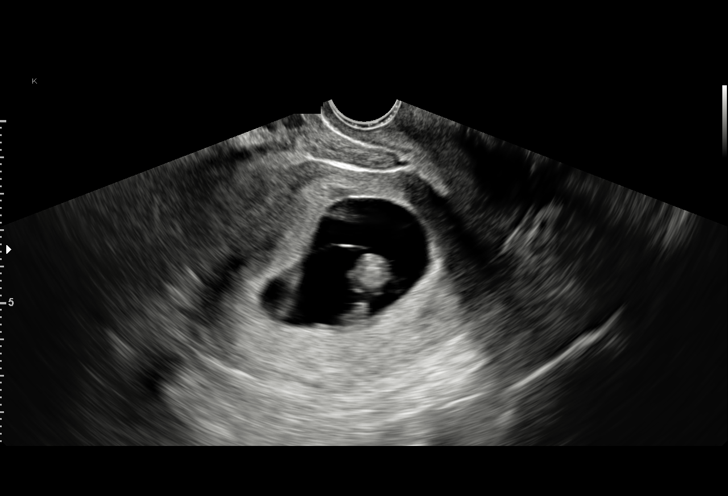
[im 29/56]
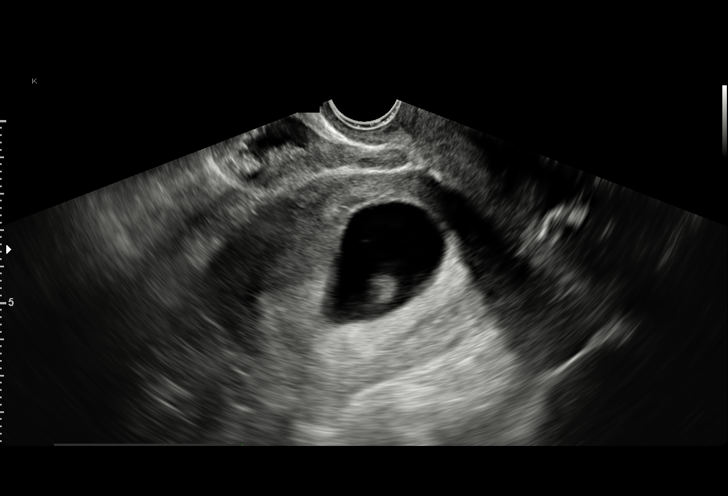
[im 31/56]
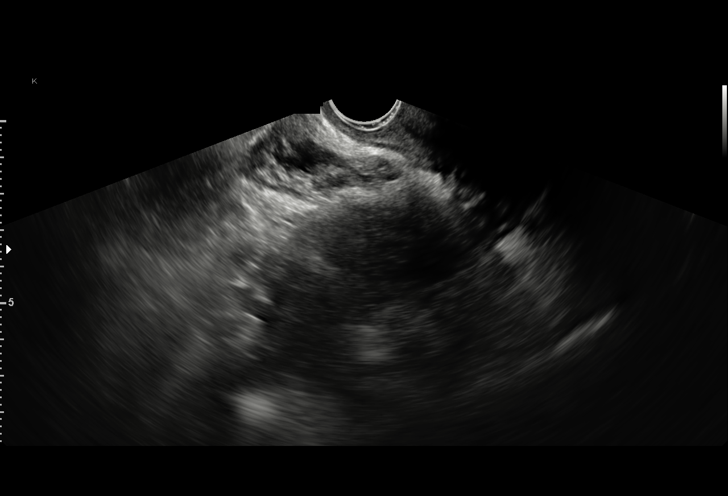
[im 35/56]
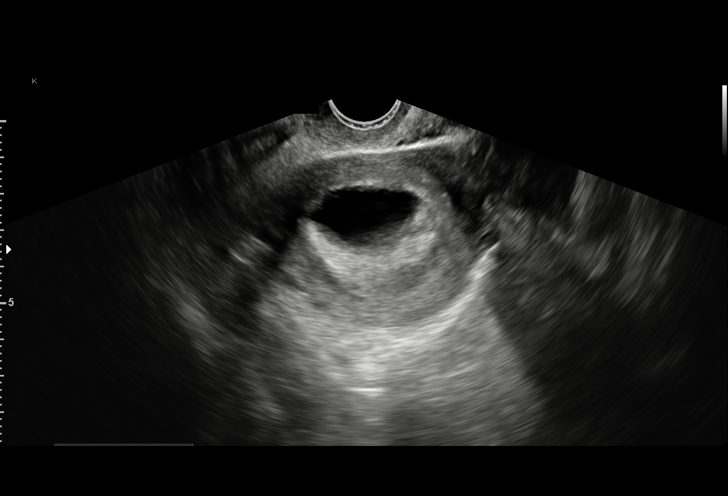
[im 39/56]
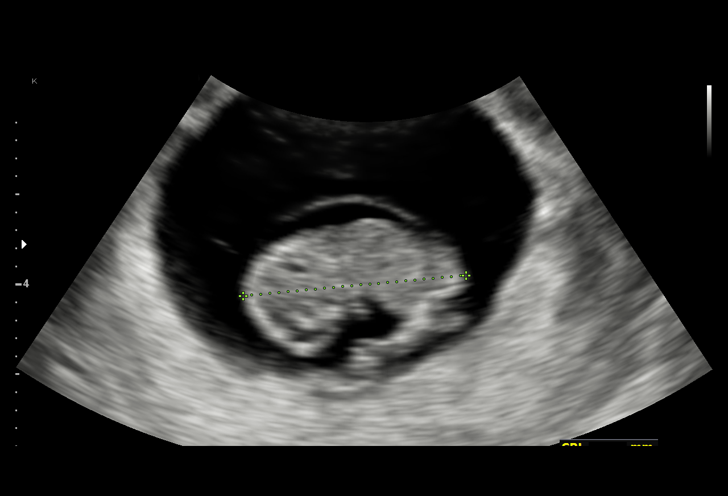
[im 43/56]
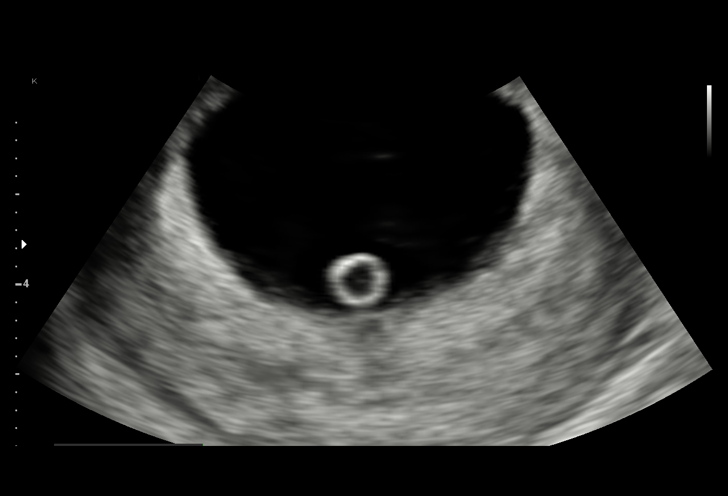
[im 47/56]
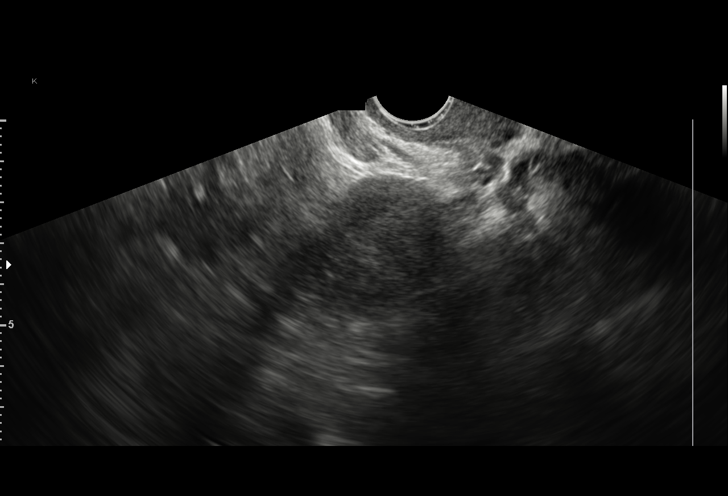
[im 51/56]
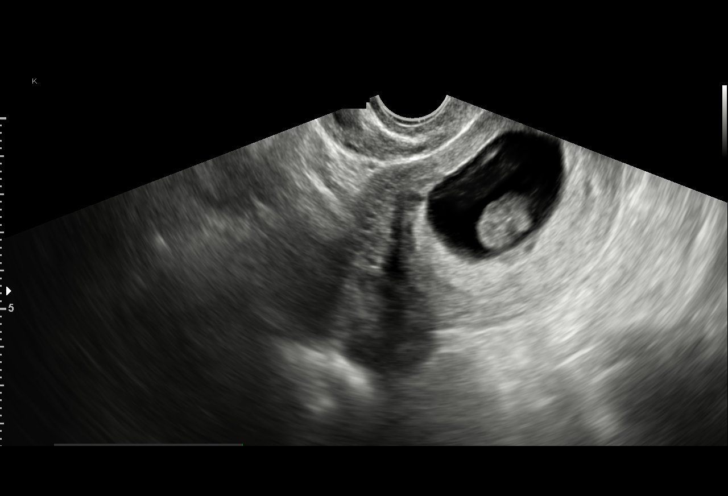
[im 56/56]
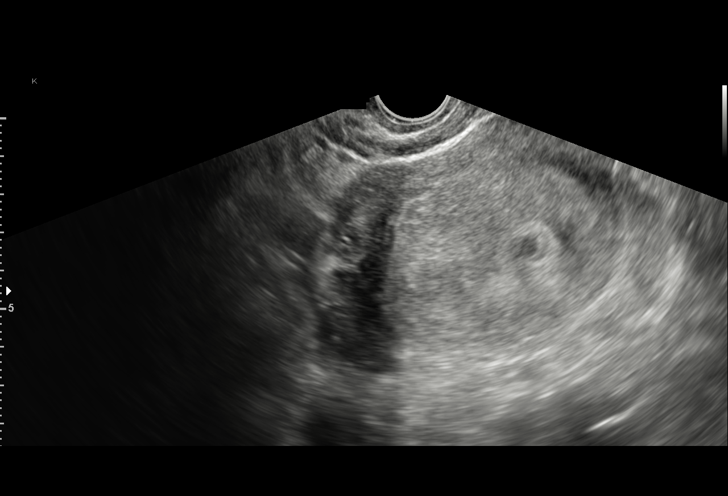

[15 of 28 positions shown; findings below may reference images not displayed]

FINDINGS: Intrauterine gestational sac: Present, single

Yolk sac:  Single, normal-appearing

Embryo:  Single

Cardiac Activity: Present

Heart Rate: 164 bpm

MSD: Appropriate in relation to fetal size

CRL:  24 mm mm   9 w   1 d                  US EDC: 07/31/2020

Subchorionic hemorrhage:  None visualized.

Maternal uterus/adnexae: The uterus is anteverted. No intrauterine
masses are seen. The placenta is not well visualized given the early
phase of gestation. The cervix is closed and demonstrates a cervical
length of approximately 3.9 cm. No free fluid is seen within the
cul-de-sac. The maternal ovaries are normal in size and echogenicity
bilaterally. No adnexal masses are seen.
IMPRESSION: Single living intrauterine gestation with an estimated gestational
age of 9 weeks, 1 day. Estimated date of delivery is 07/31/2020
